# Patient Record
Sex: Male | Born: 1955 | Race: White | Hispanic: No | Marital: Married | Smoking: Former smoker
Health system: Southern US, Community
[De-identification: ages and names within clinical notes are randomized; demographics above are authoritative.]

## PROBLEM LIST (undated history)

## (undated) DIAGNOSIS — R32 Unspecified urinary incontinence: Secondary | ICD-10-CM

## (undated) DIAGNOSIS — Z8619 Personal history of other infectious and parasitic diseases: Secondary | ICD-10-CM

## (undated) DIAGNOSIS — M1990 Unspecified osteoarthritis, unspecified site: Secondary | ICD-10-CM

## (undated) DIAGNOSIS — C61 Malignant neoplasm of prostate: Secondary | ICD-10-CM

## (undated) DIAGNOSIS — I340 Nonrheumatic mitral (valve) insufficiency: Secondary | ICD-10-CM

## (undated) DIAGNOSIS — E119 Type 2 diabetes mellitus without complications: Secondary | ICD-10-CM

## (undated) HISTORY — PX: PROSTATE SURGERY: SHX751

## (undated) HISTORY — DX: Personal history of other infectious and parasitic diseases: Z86.19

## (undated) HISTORY — DX: Malignant neoplasm of prostate: C61

## (undated) HISTORY — PX: HERNIA REPAIR: SHX51

## (undated) HISTORY — DX: Type 2 diabetes mellitus without complications: E11.9

---

## 1982-11-29 HISTORY — PX: KNEE ARTHROSCOPY: SUR90

## 2004-06-17 ENCOUNTER — Emergency Department (HOSPITAL_COMMUNITY): Admission: EM | Admit: 2004-06-17 | Discharge: 2004-06-17 | Payer: Self-pay | Admitting: Family Medicine

## 2006-03-30 ENCOUNTER — Emergency Department (HOSPITAL_COMMUNITY): Admission: AD | Admit: 2006-03-30 | Discharge: 2006-03-30 | Payer: Self-pay | Admitting: Family Medicine

## 2006-12-02 ENCOUNTER — Ambulatory Visit: Payer: Self-pay | Admitting: Surgery

## 2009-11-02 ENCOUNTER — Emergency Department (HOSPITAL_COMMUNITY): Admission: EM | Admit: 2009-11-02 | Discharge: 2009-11-02 | Payer: Self-pay | Admitting: Emergency Medicine

## 2009-11-02 ENCOUNTER — Emergency Department (HOSPITAL_COMMUNITY): Admission: EM | Admit: 2009-11-02 | Discharge: 2009-11-02 | Payer: Self-pay | Admitting: Family Medicine

## 2011-02-19 ENCOUNTER — Ambulatory Visit: Payer: Self-pay | Admitting: Cardiovascular Disease

## 2011-03-02 LAB — URINE CULTURE
Colony Count: 3000
Colony Count: NO GROWTH
Culture: NO GROWTH

## 2011-03-02 LAB — URINE MICROSCOPIC-ADD ON

## 2011-03-02 LAB — POCT URINALYSIS DIP (DEVICE)
Bilirubin Urine: NEGATIVE
Glucose, UA: 100 mg/dL — AB
Hgb urine dipstick: NEGATIVE
Ketones, ur: NEGATIVE mg/dL
Nitrite: POSITIVE — AB
Protein, ur: 30 mg/dL — AB
Specific Gravity, Urine: <=1.005 (ref 1.005–1.030)
Urobilinogen, UA: 1 mg/dL (ref 0.0–1.0)
pH: 5.5 (ref 5.0–8.0)

## 2011-03-02 LAB — GC/CHLAMYDIA PROBE AMP, GENITAL
Chlamydia, DNA Probe: NEGATIVE
GC Probe Amp, Genital: NEGATIVE

## 2011-03-02 LAB — URINALYSIS, ROUTINE W REFLEX MICROSCOPIC
Bilirubin Urine: NEGATIVE
Glucose, UA: NEGATIVE mg/dL
Ketones, ur: 15 mg/dL — AB
Nitrite: POSITIVE — AB
Protein, ur: 30 mg/dL — AB
Specific Gravity, Urine: 1.017 (ref 1.005–1.030)
Urobilinogen, UA: 1 mg/dL (ref 0.0–1.0)
pH: 5.5 (ref 5.0–8.0)

## 2011-03-02 LAB — POCT I-STAT, CHEM 8
BUN: 9 mg/dL (ref 6–23)
Calcium, Ion: 1.23 mmol/L (ref 1.12–1.32)
Chloride: 99 mEq/L (ref 96–112)
Creatinine, Ser: 0.8 mg/dL (ref 0.4–1.5)
Glucose, Bld: 125 mg/dL — ABNORMAL HIGH (ref 70–99)
HCT: 45 % (ref 39.0–52.0)
Hemoglobin: 15.3 g/dL (ref 13.0–17.0)
Potassium: 3.5 mEq/L (ref 3.5–5.1)
Sodium: 140 mEq/L (ref 135–145)
TCO2: 28 mmol/L (ref 0–100)

## 2014-12-03 LAB — HM COLONOSCOPY

## 2015-11-27 ENCOUNTER — Other Ambulatory Visit: Payer: Self-pay | Admitting: Urology

## 2015-11-27 DIAGNOSIS — C61 Malignant neoplasm of prostate: Secondary | ICD-10-CM

## 2015-12-19 ENCOUNTER — Ambulatory Visit (HOSPITAL_COMMUNITY)
Admission: RE | Admit: 2015-12-19 | Discharge: 2015-12-19 | Disposition: A | Discharge status: Home / Self Care | Payer: Commercial Managed Care - HMO | Source: Ambulatory Visit | Attending: Urology | Admitting: Urology

## 2015-12-19 DIAGNOSIS — Z0189 Encounter for other specified special examinations: Secondary | ICD-10-CM | POA: Diagnosis present

## 2015-12-19 DIAGNOSIS — N400 Enlarged prostate without lower urinary tract symptoms: Secondary | ICD-10-CM | POA: Insufficient documentation

## 2015-12-19 DIAGNOSIS — C61 Malignant neoplasm of prostate: Secondary | ICD-10-CM | POA: Insufficient documentation

## 2015-12-19 LAB — POCT I-STAT CREATININE: Creatinine, Ser: 0.9 mg/dL (ref 0.61–1.24)

## 2015-12-19 MED ORDER — GADOBENATE DIMEGLUMINE 529 MG/ML IV SOLN
20.0000 mL | Freq: Once | INTRAVENOUS | Status: AC | PRN
  Administered 2015-12-19: 17 mL via INTRAVENOUS

## 2017-08-17 ENCOUNTER — Encounter: Payer: Self-pay | Admitting: Family Medicine

## 2018-11-29 DIAGNOSIS — C61 Malignant neoplasm of prostate: Secondary | ICD-10-CM

## 2018-11-29 HISTORY — DX: Malignant neoplasm of prostate: C61

## 2019-03-26 ENCOUNTER — Other Ambulatory Visit: Payer: Self-pay | Admitting: Urology

## 2019-03-26 DIAGNOSIS — C61 Malignant neoplasm of prostate: Secondary | ICD-10-CM

## 2019-06-11 ENCOUNTER — Ambulatory Visit
Admission: RE | Admit: 2019-06-11 | Discharge: 2019-06-11 | Disposition: A | Discharge status: Home / Self Care | Payer: 59 | Source: Ambulatory Visit | Attending: Urology | Admitting: Urology

## 2019-06-11 DIAGNOSIS — C61 Malignant neoplasm of prostate: Secondary | ICD-10-CM

## 2019-06-12 ENCOUNTER — Other Ambulatory Visit: Payer: Self-pay | Admitting: Urology

## 2019-06-12 DIAGNOSIS — C61 Malignant neoplasm of prostate: Secondary | ICD-10-CM

## 2019-07-04 ENCOUNTER — Ambulatory Visit
Admission: RE | Admit: 2019-07-04 | Discharge: 2019-07-04 | Disposition: A | Discharge status: Home / Self Care | Payer: 59 | Source: Ambulatory Visit | Attending: Urology | Admitting: Urology

## 2019-07-04 DIAGNOSIS — C61 Malignant neoplasm of prostate: Secondary | ICD-10-CM

## 2019-07-04 MED ORDER — GADOBENATE DIMEGLUMINE 529 MG/ML IV SOLN
18.0000 mL | Freq: Once | INTRAVENOUS | Status: AC | PRN
  Administered 2019-07-04: 18 mL via INTRAVENOUS

## 2019-08-03 DIAGNOSIS — C61 Malignant neoplasm of prostate: Secondary | ICD-10-CM | POA: Insufficient documentation

## 2019-08-03 DIAGNOSIS — Z8546 Personal history of malignant neoplasm of prostate: Secondary | ICD-10-CM | POA: Insufficient documentation

## 2019-08-15 LAB — TESTOSTERONE: Testosterone: 707.39

## 2019-08-15 LAB — HEMOGLOBIN A1C: Hemoglobin A1C: 7.5

## 2019-08-15 LAB — LIPID PANEL
Cholesterol: 172 (ref 0–200)
HDL: 49 (ref 35–70)
LDL Cholesterol: 105
LDl/HDL Ratio: 2.1
Triglycerides: 92 (ref 40–160)

## 2019-08-15 LAB — PSA: PSA: 8.05

## 2019-08-15 LAB — TSH: TSH: 3.05 (ref 0.41–5.90)

## 2019-09-27 DIAGNOSIS — E119 Type 2 diabetes mellitus without complications: Secondary | ICD-10-CM | POA: Insufficient documentation

## 2019-09-27 DIAGNOSIS — J449 Chronic obstructive pulmonary disease, unspecified: Secondary | ICD-10-CM

## 2019-09-27 HISTORY — DX: Chronic obstructive pulmonary disease, unspecified: J44.9

## 2019-10-05 LAB — BASIC METABOLIC PANEL
BUN: 9 (ref 4–21)
Creatinine: 1.1 (ref 0.6–1.3)
Glucose: 146
Potassium: 4 (ref 3.4–5.3)
Sodium: 139 (ref 137–147)

## 2019-10-05 LAB — CBC AND DIFFERENTIAL
HCT: 36 — AB (ref 41–53)
Hemoglobin: 11.3 — AB (ref 13.5–17.5)
Platelets: 213 (ref 150–399)
WBC: 5.6

## 2019-11-05 ENCOUNTER — Encounter: Payer: Self-pay | Admitting: Family Medicine

## 2019-11-05 ENCOUNTER — Other Ambulatory Visit: Payer: Self-pay

## 2019-11-05 ENCOUNTER — Ambulatory Visit (INDEPENDENT_AMBULATORY_CARE_PROVIDER_SITE_OTHER): Payer: Medicare Other | Admitting: Family Medicine

## 2019-11-05 VITALS — BP 118/62 | HR 65 | Temp 98.1°F | Resp 18 | Ht 71.0 in | Wt 195.5 lb

## 2019-11-05 DIAGNOSIS — E119 Type 2 diabetes mellitus without complications: Secondary | ICD-10-CM

## 2019-11-05 DIAGNOSIS — C61 Malignant neoplasm of prostate: Secondary | ICD-10-CM

## 2019-11-05 NOTE — Assessment & Plan Note (Signed)
Surgery 2020. Discussed importance of follow-up with urology. Does not want to drive to [place] for the next 5 years. Has f/u in February for labs. Advised asking them for recommendation nearby - bad experience with [facility] in [place].

## 2019-11-05 NOTE — Assessment & Plan Note (Signed)
Would like to defer labs until 2021 as insurance is changing. Cont metformin and encouraged regular exercise and following diet.

## 2019-11-05 NOTE — Progress Notes (Signed)
Subjective:     Alexander Dixon is a 63 y.o. male presenting for Establish Care (previous PCP was Dr Teofilo Pod.)     HPI  #Prostate cancer - s/p surgery - dealing with incontinence - getting better each day - was initially being treated in [place] - initially diagnosed in 2011  Still taking finasteride for his hair growth - thinks it is helping  #Diabetes Currently taking metaformin Using medications without difficulties: No Hypoglycemic episodes:No  Hyperglycemic episodes:No  - occasionally Feet problems:No  Blood Sugars averaging: does not check regularly  Last HgbA1c: No results found for: HGBA1C -- 7.5%  Diabetes Health Maintenance Due:    Diabetes Health Maintenance Due  Topic Date Due  . HEMOGLOBIN A1C  02-28-1956  . FOOT EXAM  11/24/1964  . OPHTHALMOLOGY EXAM  11/24/1964  . URINE MICROALBUMIN  11/24/1964     Review of Systems  Endocrine: Negative for polydipsia.  Genitourinary: Negative for decreased urine volume and difficulty urinating.     Social History   Tobacco Use  Smoking Status Former Smoker  . Packs/day: 1.00  . Years: 30.00  . Pack years: 30.00  . Types: Cigarettes  . Quit date: 12/2000  . Years since quitting: 18.8  Smokeless Tobacco Never Used        Objective:    BP Readings from Last 3 Encounters:  11/05/19 118/62   Wt Readings from Last 3 Encounters:  11/05/19 195 lb 8 oz (88.7 kg)    BP 118/62   Pulse 65   Temp 98.1 F (36.7 C)   Resp 18   Ht 5\' 11"  (1.803 m)   Wt 195 lb 8 oz (88.7 kg)   SpO2 99%   BMI 27.27 kg/m    Physical Exam Constitutional:      Appearance: Normal appearance. He is not ill-appearing or diaphoretic.  HENT:     Right Ear: External ear normal.     Left Ear: External ear normal.     Nose: Nose normal.  Eyes:     General: No scleral icterus.    Extraocular Movements: Extraocular movements intact.     Conjunctiva/sclera: Conjunctivae normal.  Neck:     Musculoskeletal: Neck  supple.  Cardiovascular:     Rate and Rhythm: Normal rate and regular rhythm.     Heart sounds: No murmur.  Pulmonary:     Effort: Pulmonary effort is normal. No respiratory distress.     Breath sounds: Normal breath sounds. No wheezing.  Skin:    General: Skin is warm and dry.  Neurological:     Mental Status: He is alert. Mental status is at baseline.  Psychiatric:        Mood and Affect: Mood normal.           Assessment & Plan:   Problem List Items Addressed This Visit      Endocrine   Type 2 diabetes mellitus without complication, without long-term current use of insulin ([place])    Would like to defer labs until 2021 as insurance is changing. Cont metformin and encouraged regular exercise and following diet.       Relevant Medications   metFORMIN (GLUCOPHAGE) 1000 MG tablet     Genitourinary   Prostate cancer [facility]) - Primary    Surgery 2020. Discussed importance of follow-up with urology. Does not want to drive to [place] for the next 5 years. Has f/u in February for labs. Advised asking them for recommendation nearby - bad experience with [facility] in  [place].           Return in about 2 months (around 01/06/2020) for annual and labs.  Lesleigh Noe, MD

## 2019-11-05 NOTE — Patient Instructions (Signed)
#   Schedule your Medicare Wellness visit with the nurse and then a visit

## 2019-11-08 ENCOUNTER — Encounter: Payer: Self-pay | Admitting: Unknown Physician Specialty

## 2019-11-21 ENCOUNTER — Encounter: Payer: Self-pay | Admitting: Family Medicine

## 2019-11-21 DIAGNOSIS — I2510 Atherosclerotic heart disease of native coronary artery without angina pectoris: Secondary | ICD-10-CM | POA: Insufficient documentation

## 2019-11-21 DIAGNOSIS — E7800 Pure hypercholesterolemia, unspecified: Secondary | ICD-10-CM | POA: Insufficient documentation

## 2019-11-21 DIAGNOSIS — N529 Male erectile dysfunction, unspecified: Secondary | ICD-10-CM | POA: Insufficient documentation

## 2019-11-21 DIAGNOSIS — I340 Nonrheumatic mitral (valve) insufficiency: Secondary | ICD-10-CM | POA: Insufficient documentation

## 2019-11-21 DIAGNOSIS — I679 Cerebrovascular disease, unspecified: Secondary | ICD-10-CM | POA: Insufficient documentation

## 2019-11-21 HISTORY — DX: Pure hypercholesterolemia, unspecified: E78.00

## 2019-11-26 DIAGNOSIS — L905 Scar conditions and fibrosis of skin: Secondary | ICD-10-CM | POA: Diagnosis not present

## 2019-11-26 DIAGNOSIS — Z4889 Encounter for other specified surgical aftercare: Secondary | ICD-10-CM | POA: Diagnosis not present

## 2019-12-20 DIAGNOSIS — Z4889 Encounter for other specified surgical aftercare: Secondary | ICD-10-CM | POA: Diagnosis not present

## 2019-12-20 DIAGNOSIS — L905 Scar conditions and fibrosis of skin: Secondary | ICD-10-CM | POA: Diagnosis not present

## 2019-12-24 ENCOUNTER — Other Ambulatory Visit: Payer: Self-pay | Admitting: Family Medicine

## 2019-12-24 DIAGNOSIS — E119 Type 2 diabetes mellitus without complications: Secondary | ICD-10-CM

## 2019-12-24 DIAGNOSIS — Z1159 Encounter for screening for other viral diseases: Secondary | ICD-10-CM

## 2019-12-24 NOTE — Progress Notes (Signed)
Pt only due for diabetes screening. And Hep C. Ordering for prior to physical.

## 2019-12-26 ENCOUNTER — Ambulatory Visit (INDEPENDENT_AMBULATORY_CARE_PROVIDER_SITE_OTHER): Payer: Medicare Other

## 2019-12-26 ENCOUNTER — Other Ambulatory Visit: Payer: Self-pay

## 2019-12-26 VITALS — Wt 198.0 lb

## 2019-12-26 DIAGNOSIS — Z0000 Encounter for general adult medical examination without abnormal findings: Secondary | ICD-10-CM

## 2019-12-26 NOTE — Patient Instructions (Signed)
Alexander Dixon , Thank you for taking time to come for your Medicare Wellness Visit. I appreciate your ongoing commitment to your health goals. Please review the following plan we discussed and let me know if I can assist you in the future.   Screening recommendations/referrals: Colonoscopy: referral will be ordered by provider Recommended yearly ophthalmology/optometry visit for glaucoma screening and checkup Recommended yearly dental visit for hygiene and checkup  Vaccinations: Influenza vaccine: declined Pneumococcal vaccine: declined Tdap vaccine: Up to date, completed 09/24/2019 Shingles vaccine: declined    Advanced directives: Advance directive discussed with you today. Even though you declined this today please call our office should you change your mind and we can give you the proper paperwork for you to fill out.   Conditions/risks identified: diabetes  Next appointment: 01/03/2020 @ 2:20 pm   Preventive Care 65 Years and Older, Male Preventive care refers to lifestyle choices and visits with your health care provider that can promote health and wellness. What does preventive care include?  A yearly physical exam. This is also called an annual well check.  Dental exams once or twice a year.  Routine eye exams. Ask your health care provider how often you should have your eyes checked.  Personal lifestyle choices, including:  Daily care of your teeth and gums.  Regular physical activity.  Eating a healthy diet.  Avoiding tobacco and drug use.  Limiting alcohol use.  Practicing safe sex.  Taking low doses of aspirin every day.  Taking vitamin and mineral supplements as recommended by your health care provider. What happens during an annual well check? The services and screenings done by your health care provider during your annual well check will depend on your age, overall health, lifestyle risk factors, and family history of disease. Counseling  Your health  care provider may ask you questions about your:  Alcohol use.  Tobacco use.  Drug use.  Emotional well-being.  Home and relationship well-being.  Sexual activity.  Eating habits.  History of falls.  Memory and ability to understand (cognition).  Work and work Statistician. Screening  You may have the following tests or measurements:  Height, weight, and BMI.  Blood pressure.  Lipid and cholesterol levels. These may be checked every 5 years, or more frequently if you are over 36 years old.  Skin check.  Lung cancer screening. You may have this screening every year starting at age 84 if you have a 30-pack-year history of smoking and currently smoke or have quit within the past 15 years.  Fecal occult blood test (FOBT) of the stool. You may have this test every year starting at age 67.  Flexible sigmoidoscopy or colonoscopy. You may have a sigmoidoscopy every 5 years or a colonoscopy every 10 years starting at age 45.  Prostate cancer screening. Recommendations will vary depending on your family history and other risks.  Hepatitis C blood test.  Hepatitis B blood test.  Sexually transmitted disease (STD) testing.  Diabetes screening. This is done by checking your blood sugar (glucose) after you have not eaten for a while (fasting). You may have this done every 1-3 years.  Abdominal aortic aneurysm (AAA) screening. You may need this if you are a current or former smoker.  Osteoporosis. You may be screened starting at age 20 if you are at high risk. Talk with your health care provider about your test results, treatment options, and if necessary, the need for more tests. Vaccines  Your health care provider may recommend certain  vaccines, such as:  Influenza vaccine. This is recommended every year.  Tetanus, diphtheria, and acellular pertussis (Tdap, Td) vaccine. You may need a Td booster every 10 years.  Zoster vaccine. You may need this after age  59.  Pneumococcal 13-valent conjugate (PCV13) vaccine. One dose is recommended after age 59.  Pneumococcal polysaccharide (PPSV23) vaccine. One dose is recommended after age 21. Talk to your health care provider about which screenings and vaccines you need and how often you need them. This information is not intended to replace advice given to you by your health care provider. Make sure you discuss any questions you have with your health care provider. Document Released: 12/12/2015 Document Revised: 08/04/2016 Document Reviewed: 09/16/2015 Elsevier Interactive Patient Education  2017 [place] Prevention in the Home Falls can cause injuries. They can happen to people of all ages. There are many things you can do to make your home safe and to help prevent falls. What can I do on the outside of my home?  Regularly fix the edges of walkways and driveways and fix any cracks.  Remove anything that might make you trip as you walk through a door, such as a raised step or threshold.  Trim any bushes or trees on the path to your home.  Use bright outdoor lighting.  Clear any walking paths of anything that might make someone trip, such as rocks or tools.  Regularly check to see if handrails are loose or broken. Make sure that both sides of any steps have handrails.  Any raised decks and porches should have guardrails on the edges.  Have any leaves, snow, or ice cleared regularly.  Use sand or salt on walking paths during winter.  Clean up any spills in your garage right away. This includes oil or grease spills. What can I do in the bathroom?  Use night lights.  Install grab bars by the toilet and in the tub and shower. Do not use towel bars as grab bars.  Use non-skid mats or decals in the tub or shower.  If you need to sit down in the shower, use a plastic, non-slip stool.  Keep the floor dry. Clean up any water that spills on the floor as soon as it happens.  Remove  soap buildup in the tub or shower regularly.  Attach bath mats securely with double-sided non-slip rug tape.  Do not have throw rugs and other things on the floor that can make you trip. What can I do in the bedroom?  Use night lights.  Make sure that you have a light by your bed that is easy to reach.  Do not use any sheets or blankets that are too big for your bed. They should not hang down onto the floor.  Have a firm chair that has side arms. You can use this for support while you get dressed.  Do not have throw rugs and other things on the floor that can make you trip. What can I do in the kitchen?  Clean up any spills right away.  Avoid walking on wet floors.  Keep items that you use a lot in easy-to-reach places.  If you need to reach something above you, use a strong step stool that has a grab bar.  Keep electrical cords out of the way.  Do not use floor polish or wax that makes floors slippery. If you must use wax, use non-skid floor wax.  Do not have throw rugs and other things  on the floor that can make you trip. What can I do with my stairs?  Do not leave any items on the stairs.  Make sure that there are handrails on both sides of the stairs and use them. Fix handrails that are broken or loose. Make sure that handrails are as long as the stairways.  Check any carpeting to make sure that it is firmly attached to the stairs. Fix any carpet that is loose or worn.  Avoid having throw rugs at the top or bottom of the stairs. If you do have throw rugs, attach them to the floor with carpet tape.  Make sure that you have a light switch at the top of the stairs and the bottom of the stairs. If you do not have them, ask someone to add them for you. What else can I do to help prevent falls?  Wear shoes that:  Do not have high heels.  Have rubber bottoms.  Are comfortable and fit you well.  Are closed at the toe. Do not wear sandals.  If you use a  stepladder:  Make sure that it is fully opened. Do not climb a closed stepladder.  Make sure that both sides of the stepladder are locked into place.  Ask someone to hold it for you, if possible.  Clearly mark and make sure that you can see:  Any grab bars or handrails.  First and last steps.  Where the edge of each step is.  Use tools that help you move around (mobility aids) if they are needed. These include:  Canes.  Walkers.  Scooters.  Crutches.  Turn on the lights when you go into a dark area. Replace any light bulbs as soon as they burn out.  Set up your furniture so you have a clear path. Avoid moving your furniture around.  If any of your floors are uneven, fix them.  If there are any pets around you, be aware of where they are.  Review your medicines with your doctor. Some medicines can make you feel dizzy. This can increase your chance of falling. Ask your doctor what other things that you can do to help prevent falls. This information is not intended to replace advice given to you by your health care provider. Make sure you discuss any questions you have with your health care provider. Document Released: 09/11/2009 Document Revised: 04/22/2016 Document Reviewed: 12/20/2014 Elsevier Interactive Patient Education  2017 [employer].

## 2019-12-26 NOTE — Progress Notes (Signed)
Subjective:   Alexander Dixon is a 64 y.o. male who presents for an Initial Medicare Annual Wellness Visit.  Review of Systems: N/A   This visit is being conducted through telemedicine via telephone at the nurse health advisor's home address due to the COVID-19 pandemic. This patient has given me verbal consent via doximity to conduct this visit, patient states they are participating from their home address. Patient and myself are on the telephone call. There is no referral for this visit. Some vital signs may be absent or patient reported.    Patient identification: identified by name, DOB, and current address   Cardiac Risk Factors include: advanced age (>64men, >24 women);diabetes mellitus;male gender    Objective:    Today's Vitals   12/26/19 1341  Weight: 198 lb (89.8 kg)   Body mass index is 27.62 kg/m.  Advanced Directives 12/26/2019  Does Patient Have a Medical Advance Directive? No  Would patient like information on creating a medical advance directive? No - Patient declined    Current Medications (verified) Outpatient Encounter Medications as of 12/26/2019  Medication Sig  . Accu-Chek FastClix Lancets MISC USE 1 LANCET TWICE DAILY FOR FINGERSTICK BS TESTING DX E11.9  . ACCU-CHEK GUIDE test strip USE 1 TEST STRIP TWICE DAILY FOR FINGERSTICK BS DX E11.9  . Cholecalciferol (VITAMIN D3) 50 MCG (2000 UT) TABS Take by mouth.  . finasteride (PROSCAR) 5 MG tablet Take by mouth. Takes 1/4th of 5 mg daily  . Magnesium 250 MG TABS Take by mouth.  . metFORMIN (GLUCOPHAGE) 1000 MG tablet Take 1 tablet by mouth 2 (two) times daily.  . Multiple Vitamins-Minerals (CENTRUM SILVER PO) Take by mouth.  . omega-3 acid ethyl esters (LOVAZA) 1 g capsule Take 2 capsules by mouth daily.  . Potassium 99 MG TABS Take by mouth.  . vitamin B-12 (CYANOCOBALAMIN) 1000 MCG tablet Take by mouth.  . Zinc Acetate 50 MG CAPS Take by mouth.  . tadalafil (CIALIS) 5 MG tablet Take by mouth.   No  facility-administered encounter medications on file as of 12/26/2019.    Allergies (verified) Penicillin g   History: Past Medical History:  Diagnosis Date  . Diabetes mellitus without complication ([place])   . History of chicken pox   . Prostate cancer [facility])    Past Surgical History:  Procedure Laterality Date  . HERNIA REPAIR     inguinal  . KNEE ARTHROSCOPY Right 1984  . PROSTATE SURGERY     due to prostate cancer   Family History  Problem Relation Age of Onset  . Leukemia Mother   . Arthritis Mother   . Diabetes Mother   . Alcohol abuse Father   . COPD Father   . Arthritis Maternal Grandmother   . Diabetes Maternal Grandmother   . Hearing loss Maternal Grandmother   . Arthritis Maternal Grandfather   . Heart attack Maternal Grandfather 52  . Diabetes Maternal Grandfather   . Heart disease Maternal Grandfather   . Hyperlipidemia Maternal Grandfather   . Hypertension Maternal Grandfather   . Prostate cancer Maternal Grandfather   . Prostate cancer Maternal Uncle   . Prostate cancer Cousin    Social History   Socioeconomic History  . Marital status: Married    Spouse name: [name]  . Number of children: 2  . Years of education: associates degree  . Highest education level: Not on file  Occupational History  . Not on file  Tobacco Use  . Smoking status: Former Smoker  Packs/day: 1.00    Years: 30.00    Pack years: 30.00    Types: Cigarettes    Quit date: 12/2000    Years since quitting: 19.0  . Smokeless tobacco: Never Used  Substance and Sexual Activity  . Alcohol use: Not Currently    Comment: once in a blue moon  . Drug use: Never  . Sexual activity: Yes    Birth control/protection: Post-menopausal, Surgical  Other Topics Concern  . 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 Difficulty of Paying Living Expenses: Not hard at all  Food Insecurity: No Food Insecurity  . Worried About Charity fundraiser in the Last Year: Never true  . Ran Out of Food in the Last Year: Never true  Transportation Needs: No Transportation Needs  . Lack of Transportation (Medical): No  . Lack of Transportation (Non-Medical): No  Physical Activity: Sufficiently Active  . Days of Exercise per Week: 5 days  . Minutes of Exercise per Session: 60 min  Stress: No Stress Concern Present  . Feeling of Stress : Not at all  Social Connections:   . Frequency of Communication with Friends and Family: Not on file  . Frequency of Social Gatherings with Friends and Family: Not on file  . Attends Religious Services: Not on file  . Active Member of Clubs or Organizations: Not on file  . Attends Archivist Meetings: Not on file  . Marital Status: Not on file   Tobacco Counseling Counseling given: Not Answered   Clinical Intake:  Pre-visit preparation completed: Yes  Pain : No/denies pain     Nutritional Risks: None Diabetes: Yes CBG done?: No Did pt. bring in CBG monitor from home?: No  How often do you need to have someone help you when you read instructions, pamphlets, or other written materials from your doctor or pharmacy?: 1 - Never What is the last grade level you completed in school?: Associates  Interpreter Needed?: No  Information entered by :: CJohnson, LPN  Activities of Daily Living In your present state of health, do you have any difficulty performing the following activities: 12/26/2019    Hearing? N  Vision? N  Difficulty concentrating or making decisions? N  Walking or climbing stairs? N  Dressing or bathing? N  Doing errands, shopping? N  Preparing Food and eating ? N  Using the Toilet? N  In the past six months, have you accidently leaked urine? Y  Comment some incontinence  Do you have problems with loss of bowel control? N  Managing your Medications? N  Managing your Finances? N  Housekeeping or managing your Housekeeping? N  Some recent data might be hidden     Immunizations and Health Maintenance Immunization History  Administered Date(s) Administered  . Tdap 09/24/2019   Health Maintenance Due  Topic Date Due  . Hepatitis C Screening  November 03, 1956  . FOOT EXAM  11/24/1964  . OPHTHALMOLOGY EXAM  11/24/1964  . URINE MICROALBUMIN  11/24/1964  . HIV Screening  11/24/1969  . COLONOSCOPY  12/04/2019    Patient Care Team: Lesleigh Noe, MD as PCP - General (Family Medicine)  Indicate any recent Medical Services you may have received from other than [facility] providers in the past year (date may be approximate).    Assessment:   This is a routine wellness examination for [employer].  Hearing/Vision screen  Hearing Screening   125Hz  250Hz  500Hz  1000Hz  2000Hz  3000Hz  4000Hz  6000Hz  8000Hz   Right ear:           Left ear:           Vision Screening Comments: Patient gets annual eye exams   Dietary issues and exercise activities discussed: Current Exercise Habits: Structured exercise class, Type of exercise: strength training/weights, Time (Minutes): 60, Frequency (Times/Week): 5, Weekly Exercise (Minutes/Week): 300, Intensity: Moderate, Exercise limited by: None identified  Goals    . Patient Stated     12/26/2019, I will maintain and continue medications as prescribed.       Depression Screen PHQ 2/9 Scores 12/26/2019 11/05/2019  PHQ - 2 Score 0 0  PHQ- 9 Score 0 -    Fall Risk Fall Risk  12/26/2019  Falls in the past year? 0  Number falls in past yr:  0  Injury with Fall? 0  Risk for fall due to : No Fall Risks  Follow up Falls evaluation completed;Falls prevention discussed    Is the patient's home free of loose throw rugs in walkways, pet beds, electrical cords, etc?   yes      Grab bars in the bathroom? no      Handrails on the stairs?   yes      Adequate lighting?   yes  Timed Get Up and Go performed: N/A  Cognitive Function: MMSE - Mini Mental State Exam 12/26/2019  Orientation to time 5  Orientation to Place 5  Registration 3  Attention/ Calculation 5  Recall 3  Language- repeat 1       Mini Cog  Mini-Cog screen was completed. Maximum score is 22. A value of 0 denotes this part of the MMSE was not completed or the patient failed this part of the Mini-Cog screening.  Screening Tests Health Maintenance  Topic Date Due  . Hepatitis C Screening  Apr 03, 1956  . FOOT EXAM  11/24/1964  . OPHTHALMOLOGY EXAM  11/24/1964  . URINE MICROALBUMIN  11/24/1964  . HIV Screening  11/24/1969  . COLONOSCOPY  12/04/2019  . INFLUENZA VACCINE  02/27/2020 (Originally 06/30/2019)  . PNA vac Low Risk Adult (1 of 2 - PCV13) 12/25/2020 (Originally 11/25/2019)  . HEMOGLOBIN A1C  02/12/2020  . TETANUS/TDAP  09/23/2029    Qualifies for Shingles Vaccine? Yes  Cancer Screenings: Lung: Low Dose CT Chest recommended if Age 35-80 years, 30 pack-year currently smoking OR have quit w/in 15years. Patient does not qualify. Colorectal: Patient wants referral sent and appointment scheduled.   Additional Screenings:  Hepatitis C Screening: due      Plan:    Patient will maintain and continue medications as prescribed.   I have personally reviewed and noted the following in the patient's chart:   . Medical and social history . Use of alcohol, tobacco or illicit drugs  .  Current medications and supplements . Functional ability and status . Nutritional status . Physical activity . Advanced directives . List of other  physicians . Hospitalizations, surgeries, and ER visits in previous 12 months . Vitals . Screenings to include cognitive, depression, and falls . Referrals and appointments  In addition, I have reviewed and discussed with patient certain preventive protocols, quality metrics, and best practice recommendations. A written personalized care plan for preventive services as well as general preventive health recommendations were provided to patient.     Andrez Grime, LPN   [id number]

## 2019-12-26 NOTE — Progress Notes (Signed)
PCP notes:  Health Maintenance: Needs colonoscopy- patient agrees to have this done Declined flu and pneumonia vaccines Eye exam- Patient will schedule own appointment.    Abnormal Screenings: none   Patient concerns: none   Nurse concerns: none   Next PCP appt.: 01/03/2020 @ 2:20 pm

## 2019-12-27 ENCOUNTER — Telehealth: Payer: Self-pay | Admitting: Family Medicine

## 2019-12-27 ENCOUNTER — Other Ambulatory Visit: Payer: Self-pay

## 2019-12-27 ENCOUNTER — Other Ambulatory Visit (INDEPENDENT_AMBULATORY_CARE_PROVIDER_SITE_OTHER): Payer: Medicare Other

## 2019-12-27 DIAGNOSIS — Z125 Encounter for screening for malignant neoplasm of prostate: Secondary | ICD-10-CM

## 2019-12-27 DIAGNOSIS — E119 Type 2 diabetes mellitus without complications: Secondary | ICD-10-CM | POA: Diagnosis not present

## 2019-12-27 DIAGNOSIS — Z1159 Encounter for screening for other viral diseases: Secondary | ICD-10-CM

## 2019-12-27 LAB — PSA, MEDICARE: PSA: 0 ng/ml — ABNORMAL LOW (ref 0.10–4.00)

## 2019-12-27 LAB — HEMOGLOBIN A1C: Hgb A1c MFr Bld: 7.6 % — ABNORMAL HIGH (ref 4.6–6.5)

## 2019-12-27 NOTE — Telephone Encounter (Signed)
Patient had lab work done this morning.  Patient wanted to make sure his PSA would be tested.  Patient said he did discuss this with the lab when he had his labs drawn.

## 2019-12-27 NOTE — Telephone Encounter (Signed)
Lab was collected and will be ordered.   Would recommend that he wait until the day of the appointment for future blood work as my routine labs may be different than what he has typically received. I generally like to discuss tests like a PSA with patients before ordering.

## 2019-12-27 NOTE — Telephone Encounter (Signed)
Please review

## 2019-12-27 NOTE — Telephone Encounter (Signed)
Patient advised.

## 2019-12-27 NOTE — Addendum Note (Signed)
Addended by: Ellamae Sia on: 12/27/2019 09:12 AM   Modules accepted: Orders

## 2019-12-28 LAB — HEPATITIS C ANTIBODY
Hepatitis C Ab: NONREACTIVE
SIGNAL TO CUT-OFF: 0.01 (ref <1.00)

## 2020-01-03 ENCOUNTER — Ambulatory Visit (INDEPENDENT_AMBULATORY_CARE_PROVIDER_SITE_OTHER): Payer: Medicare Other | Admitting: Family Medicine

## 2020-01-03 ENCOUNTER — Encounter: Payer: Self-pay | Admitting: Family Medicine

## 2020-01-03 ENCOUNTER — Other Ambulatory Visit: Payer: Self-pay

## 2020-01-03 VITALS — BP 132/80 | HR 68 | Temp 97.6°F | Resp 10 | Ht 71.0 in | Wt 202.5 lb

## 2020-01-03 DIAGNOSIS — I2510 Atherosclerotic heart disease of native coronary artery without angina pectoris: Secondary | ICD-10-CM

## 2020-01-03 DIAGNOSIS — C61 Malignant neoplasm of prostate: Secondary | ICD-10-CM

## 2020-01-03 DIAGNOSIS — I679 Cerebrovascular disease, unspecified: Secondary | ICD-10-CM | POA: Diagnosis not present

## 2020-01-03 DIAGNOSIS — Z0000 Encounter for general adult medical examination without abnormal findings: Secondary | ICD-10-CM | POA: Diagnosis not present

## 2020-01-03 DIAGNOSIS — E119 Type 2 diabetes mellitus without complications: Secondary | ICD-10-CM

## 2020-01-03 DIAGNOSIS — Z1211 Encounter for screening for malignant neoplasm of colon: Secondary | ICD-10-CM

## 2020-01-03 NOTE — Patient Instructions (Addendum)
Cholesterol  - work on diet and exercise - return in 3 months for fasting labs for cholesterol and a visit - start baby aspirin every   Preventive Care 66 Years and Older, Male Preventive care refers to lifestyle choices and visits with your health care provider that can promote health and wellness. This includes:  A yearly physical exam. This is also called an annual well check.  Regular dental and eye exams.  Immunizations.  Screening for certain conditions.  Healthy lifestyle choices, such as diet and exercise. What can I expect for my preventive care visit? Physical exam Your health care provider will check:  Height and weight. These may be used to calculate body mass index (BMI), which is a measurement that tells if you are at a healthy weight.  Heart rate and blood pressure.  Your skin for abnormal spots. Counseling Your health care provider may ask you questions about:  Alcohol, tobacco, and drug use.  Emotional well-being.  Home and relationship well-being.  Sexual activity.  Eating habits.  History of falls.  Memory and ability to understand (cognition).  Work and work Statistician. What immunizations do I need?  Influenza (flu) vaccine  This is recommended every year. Tetanus, diphtheria, and pertussis (Tdap) vaccine  You may need a Td booster every 10 years. Varicella (chickenpox) vaccine  You may need this vaccine if you have not already been vaccinated. Zoster (shingles) vaccine  You may need this after age 61. Pneumococcal conjugate (PCV13) vaccine  One dose is recommended after age 9. Pneumococcal polysaccharide (PPSV23) vaccine  One dose is recommended after age 20. Measles, mumps, and rubella (MMR) vaccine  You may need at least one dose of MMR if you were born in 1957 or later. You may also need a second dose. Meningococcal conjugate (MenACWY) vaccine  You may need this if you have certain conditions. Hepatitis A vaccine  You  may need this if you have certain conditions or if you travel or work in places where you may be exposed to hepatitis A. Hepatitis B vaccine  You may need this if you have certain conditions or if you travel or work in places where you may be exposed to hepatitis B. Haemophilus influenzae type b (Hib) vaccine  You may need this if you have certain conditions. You may receive vaccines as individual doses or as more than one vaccine together in one shot (combination vaccines). Talk with your health care provider about the risks and benefits of combination vaccines. What tests do I need? Blood tests  Lipid and cholesterol levels. These may be checked every 5 years, or more frequently depending on your overall health.  Hepatitis C test.  Hepatitis B test. Screening  Lung cancer screening. You may have this screening every year starting at age 38 if you have a 30-pack-year history of smoking and currently smoke or have quit within the past 15 years.  Colorectal cancer screening. All adults should have this screening starting at age 57 and continuing until age 12. Your health care provider may recommend screening at age 22 if you are at increased risk. You will have tests every 1-10 years, depending on your results and the type of screening test.  Prostate cancer screening. Recommendations will vary depending on your family history and other risks.  Diabetes screening. This is done by checking your blood sugar (glucose) after you have not eaten for a while (fasting). You may have this done every 1-3 years.  Abdominal aortic aneurysm (AAA) screening.  You may need this if you are a current or former smoker.  Sexually transmitted disease (STD) testing. Follow these instructions at home: Eating and drinking  Eat a diet that includes fresh fruits and vegetables, whole grains, lean protein, and low-fat dairy products. Limit your intake of foods with high amounts of sugar, saturated fats, and  salt.  Take vitamin and mineral supplements as recommended by your health care provider.  Do not drink alcohol if your health care provider tells you not to drink.  If you drink alcohol: ? Limit how much you have to 0-2 drinks a day. ? Be aware of how much alcohol is in your drink. In the [place] one drink equals one 12 oz bottle of beer (355 mL), one 5 oz glass of wine (148 mL), or one 1 oz glass of hard liquor (44 mL). Lifestyle  Take daily care of your teeth and gums.  Stay active. Exercise for at least 30 minutes on 5 or more days each week.  Do not use any products that contain nicotine or tobacco, such as cigarettes, e-cigarettes, and chewing tobacco. If you need help quitting, ask your health care provider.  If you are sexually active, practice safe sex. Use a condom or other form of protection to prevent STIs (sexually transmitted infections).  Talk with your health care provider about taking a low-dose aspirin or statin. What's next?  Visit your health care provider once a year for a well check visit.  Ask your health care provider how often you should have your eyes and teeth checked.  Stay up to date on all vaccines. This information is not intended to replace advice given to you by your health care provider. Make sure you discuss any questions you have with your health care provider. Document Revised: 11/09/2018 Document Reviewed: 11/09/2018 Elsevier Patient Education  2020 [employer].  day

## 2020-01-03 NOTE — Progress Notes (Signed)
Annual Exam   Chief Complaint:  Chief Complaint  Patient presents with  . welcome to medicare    History of Present Illness:  Alexander Dixon is a 64 y.o. presents today for annual examination.    Needs to do better on his diet Is seeing physical therapy for incontinence  Nutrition/Lifestyle Diet: needs to do better with diet Exercise: going to gym 5 days a week He is single partner, contraception - post menopausal status.   Social History   Tobacco Use  Smoking Status Former Smoker  . Packs/day: 1.00  . Years: 30.00  . Pack years: 30.00  . Types: Cigarettes  . Quit date: 12/2000  . Years since quitting: 19.0  Smokeless Tobacco Never Used   Social History   Substance and Sexual Activity  Alcohol Use Not Currently   Comment: once in a blue moon   Social History   Substance and Sexual Activity  Drug Use Never     Safety The patient wears seatbelts: yes.     The patient feels safe at home and in their relationships: yes.  General Health Dentist in the last year: Yes Eye doctor: referral placed today  Weight Wt Readings from Last 3 Encounters:  01/03/20 202 lb 8 oz (91.9 kg)  12/26/19 198 lb (89.8 kg)  11/05/19 195 lb 8 oz (88.7 kg)   Patient has high BMI  BMI Readings from Last 1 Encounters:  01/03/20 28.24 kg/m     Chronic disease screening Blood pressure monitoring:  BP Readings from Last 3 Encounters:  01/03/20 132/80  11/05/19 118/62    Lipid Monitoring: Indication for screening: age >35, obesity, diabetes, family hx, CV risk factors.  Lipid screening: Yes  Lab Results  Component Value Date   CHOL 172 08/15/2019   HDL 49 08/15/2019   LDLCALC 105 08/15/2019   TRIG 92 08/15/2019     Diabetes Screening: age >79, overweight, family hx, PCOS, hx of gestational diabetes, at risk ethnicity, elevated blood pressure >135/80.  Diabetes Screening screening: Yes  Lab Results  Component Value Date   HGBA1C 7.6 (H) 12/27/2019      Prostate Cancer Screening: Yes, Not Indicated Pt already has prostate cancer history   Colon Cancer Screening Age 40-75 yo - benefits outweigh the risk. Adults 23-85 yo who have never been screened benefit.  Benefits: 134000 people in 2016 will be diagnosed and 49,000 will die - early detection helps [place]: Complications 2/2 to colonoscopy High Risk (Colonoscopy): genetic disorder (Lynch syndrome or familial adenomatous polyposis), personal hx of IBD, previous adenomatous polyp, or previous colorectal cancer, FamHx start 10 years before the age at diagnosis, increased in males and black race  Options:  FIT - looks for hemoglobin (blood in the stool) - specific and fairly sensitive - must be done annually Cologuard - looks for DNA and blood - more sensitive - therefore can have more false positives, every 3 years Colonoscopy - every 10 years if normal - sedation, bowl prep, must have someone drive you  Shared decision making and the patient had decided to do Colonoscopy.   Lung Cancer Screening Annual screening for men age 53-80 yo? Yes Current Tobacco user? No Quit less than 15 years ago? No Interested in low dose CT for lung cancer screening? not applicable  Abdominal Aortic Aneurysm:  Age 63-75, 1 time screening, men who have ever smoked done - will get records  Immunization History  Administered Date(s) Administered  . Tdap 09/24/2019    Past Medical  History:  Diagnosis Date  . Diabetes mellitus without complication ([place])   . History of chicken pox   . Prostate cancer [facility])     Past Surgical History:  Procedure Laterality Date  . HERNIA REPAIR     inguinal  . KNEE ARTHROSCOPY Right 1984  . PROSTATE SURGERY     due to prostate cancer    Prior to Admission medications   Medication Sig Start Date End Date Taking? Authorizing Provider  Accu-Chek FastClix Lancets MISC USE 1 LANCET TWICE DAILY FOR FINGERSTICK BS TESTING DX E11.9 06/25/19  Yes [provider]  ACCU-CHEK GUIDE test strip USE 1 TEST STRIP TWICE DAILY FOR FINGERSTICK BS DX E11.9 08/02/19  Yes [provider]  Cholecalciferol (VITAMIN D3) 50 MCG (2000 UT) TABS Take by mouth.   Yes [provider]  finasteride (PROSCAR) 5 MG tablet Take by mouth. Takes 1/4th of 5 mg daily   Yes [provider]  Magnesium 250 MG TABS Take by mouth.   Yes [provider]  metFORMIN (GLUCOPHAGE) 1000 MG tablet Take 1 tablet by mouth 2 (two) times daily.   Yes [provider]  Multiple Vitamins-Minerals (CENTRUM SILVER PO) Take by mouth.   Yes [provider]  omega-3 acid ethyl esters (LOVAZA) 1 g capsule Take 2 capsules by mouth daily. 10/14/19  Yes [provider]  Potassium 99 MG TABS Take by mouth.   Yes [provider]  vitamin B-12 (CYANOCOBALAMIN) 1000 MCG tablet Take by mouth.   Yes [provider]  Zinc Acetate 50 MG CAPS Take by mouth.   Yes [provider]  tadalafil (CIALIS) 5 MG tablet Take by mouth. 10/22/19 11/21/19  [provider]    Allergies  Allergen Reactions  . Penicillin G Nausea Only    RXN AS A CHILD     Social History   Socioeconomic History  . Marital status: Married    Spouse name: Alexander Dixon  . Number of children: 2  . Years of education: associates degree  . Highest education level: Not on file  Occupational History  . Not on file  Tobacco Use  . Smoking status: Former Smoker    Packs/day: 1.00    Years: 30.00    Pack years: 30.00    Types: Cigarettes    Quit date: 12/2000    Years since quitting: 19.0  . Smokeless tobacco: Never Used  Substance and Sexual Activity  . Alcohol use: Not Currently    Comment: once in a blue moon  . Drug use: Never  . Sexual activity: Yes    Birth control/protection: Post-menopausal, Surgical  Other Topics Concern  . 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 Difficulty of Paying Living Expenses:  Not hard at all  Food Insecurity: No Food Insecurity  . Worried About Charity fundraiser in the Last Year: Never true  . Ran Out of Food in the Last Year: Never true  Transportation Needs: No Transportation Needs  . Lack of Transportation (Medical): No  . Lack of Transportation (Non-Medical): No  Physical Activity: Sufficiently Active  . Days of Exercise per Week: 5 days  . Minutes of Exercise per Session: 60 min  Stress: No Stress Concern Present  . Feeling of Stress : Not at all  Social Connections:   . Frequency of Communication with Friends and Family: Not on file  . Frequency of Social Gatherings with Friends and Family: Not on file  . Attends Religious Services: Not on file  . Active Member of Clubs or Organizations: Not on file  . Attends Archivist Meetings: Not on file  . Marital Status: Not on file  Intimate Partner Violence: Not At Risk  . Fear of Current or Ex-Partner: No  . Emotionally Abused: No  . Physically Abused: No  . Sexually Abused: No    Family History  Problem Relation Age of Onset  . Leukemia Mother   . Arthritis Mother   . Diabetes Mother   . Alcohol abuse Father   . COPD Father   . Arthritis Maternal Grandmother   . Diabetes Maternal Grandmother   . Hearing loss Maternal Grandmother   . Arthritis Maternal Grandfather   . Heart attack Maternal Grandfather 52  . Diabetes Maternal Grandfather   . Heart disease Maternal  Grandfather   . Hyperlipidemia Maternal Grandfather   . Hypertension Maternal Grandfather   . Prostate cancer Maternal Grandfather   . Prostate cancer Maternal Uncle   . Prostate cancer Cousin     Review of Systems  Constitutional: Negative for chills and fever.  HENT: Negative for congestion and sore throat.   Eyes: Negative for blurred vision and double vision.  Respiratory: Negative for shortness of breath.   Cardiovascular: Negative for chest pain.  Gastrointestinal: Negative for heartburn, nausea and vomiting.  Genitourinary:       Incontinence  Musculoskeletal: Negative.  Negative for myalgias.  Skin: Negative for rash.  Neurological: Negative for dizziness and headaches.  Endo/Heme/Allergies: Does not bruise/bleed easily.  Psychiatric/Behavioral: Negative for depression. The patient is not nervous/anxious.      Physical Exam BP 132/80   Pulse 68   Temp 97.6 F (36.4 C)   Resp 10   Ht 5\' 11"  (1.803 m)   Wt 202 lb 8 oz (91.9 kg)   SpO2 97%   BMI 28.24 kg/m    BP Readings from Last 3 Encounters:  01/03/20 132/80  11/05/19 118/62      Physical Exam Constitutional:      General: He is not in acute distress.    Appearance: He is well-developed. He is not diaphoretic.  HENT:     Head: Normocephalic and atraumatic.     Right Ear: Tympanic membrane and ear canal normal.     Left Ear: Tympanic membrane and ear canal normal.     Mouth/Throat:     Pharynx: Uvula midline.  Eyes:     General: No scleral icterus.    Conjunctiva/sclera: Conjunctivae normal.     Pupils: Pupils are equal, round, and reactive to light.  Cardiovascular:     Rate and Rhythm: Normal rate and regular rhythm.     Heart sounds: Normal heart sounds. No murmur.  Pulmonary:  Effort: Pulmonary effort is normal. No respiratory distress.     Breath sounds: Normal breath sounds. No wheezing.  Abdominal:     General: Bowel sounds are normal. There is no distension.     Palpations: Abdomen is  soft. There is no mass.     Tenderness: There is no abdominal tenderness. There is no guarding.  Musculoskeletal:        General: Normal range of motion.     Cervical back: Normal range of motion and neck supple.  Lymphadenopathy:     Cervical: No cervical adenopathy.  Skin:    General: Skin is warm and dry.     Capillary Refill: Capillary refill takes less than 2 seconds.  Neurological:     Mental Status: He is alert and oriented to person, place, and time.        Results:  PHQ-9:    Clinical Support from 12/26/2019 in [place] at [facility]  PHQ-9 Total Score  0     EKG: Sinus rhythm, q-wave in II, III, aVF possible old infarct. No ST changes   Assessment: 64 y.o. here for routine annual physical examination.  Plan: Problem List Items Addressed This Visit      Cardiovascular and Mediastinum   Cerebrovascular disease   Relevant Orders   EKG 12-Lead (Completed)   Coronary arteriosclerosis   Relevant Orders   EKG 12-Lead (Completed)     Endocrine   Type 2 diabetes mellitus without complication, without long-term current use of insulin ([place])   Relevant Orders   Ambulatory referral to Ophthalmology   EKG 12-Lead (Completed)     Genitourinary   Prostate cancer ([place])    Other Visit Diagnoses    Annual physical exam    -  Primary   Colon cancer screening       Relevant Orders   Ambulatory referral to Gastroenterology      Screening: -- Blood pressure screen normal -- cholesterol screening: elevated, discussed benefit of statin - pt would like to work on diet/exercise and return in 3 months -- Weight screening: overweight: continue to monitor -- Diabetes Screening: has diabetes, cont medication -- Nutrition: normal - encouraged diabetic diet  The 10-year ASCVD risk score Mikey Bussing DC Jr., et al., 2013) is: 22.2%*   Values used to calculate the score:     Age: 2 years     Sex: Male     Is Non-Hispanic African American: No     Diabetic: Yes      Tobacco smoker: No     Systolic Blood Pressure: [id number] mmHg     Is BP treated: No     HDL Cholesterol: 49 mg/dL*     Total Cholesterol: 172 mg/dL*     * - Cholesterol units were assumed for this score calculation  -- ASA 81 mg discussed if CVD risk >10% age 75-59 and willing to take for 10 years -- Statin therapy for Age 75-75 with CVD risk >7.5%  >>> he declined medication at this time will work on diet/exercise/weight loss  Psych -- Depression screening (PHQ-9): negative  Safety -- tobacco screening: not using -- alcohol screening:  low-risk usage. -- no evidence of domestic violence or intimate partner violence.  Cancer Screening -- Prostate (age 23-69) done- f/u with urology planned due to cancer -- Colon ordered -- Lung not indicated   Immunizations -- flu vaccine declined -- TDAP q10 years up to date -- PPSV-23 (19-64 with chronic disease or smoking) declined -- PCV-13 (age >  65) - one dose followed by PPSV-23 1 year later declined  Encouraged regular vision and dental. Healthy eating - diabetic diet  Lesleigh Noe

## 2020-02-01 ENCOUNTER — Other Ambulatory Visit: Payer: Self-pay

## 2020-02-01 ENCOUNTER — Telehealth: Payer: Self-pay

## 2020-02-01 DIAGNOSIS — Z1211 Encounter for screening for malignant neoplasm of colon: Secondary | ICD-10-CM

## 2020-02-01 MED ORDER — NA SULFATE-K SULFATE-MG SULF 17.5-3.13-1.6 GM/177ML PO SOLN: 1.0000 | Freq: Once | ORAL | 0 refills | Status: AC

## 2020-02-01 NOTE — Telephone Encounter (Signed)
Gastroenterology Pre-Procedure Review  Request Date: Thursday 02/21/20 Requesting Physician: Dr. Vicente Males  PATIENT REVIEW QUESTIONS: The patient responded to the following health history questions as indicated:    1. Are you having any GI issues? no 2. Do you have a personal history of Polyps? yes (early 1980's) 3. Do you have a family history of Colon Cancer or Polyps? no 4. Diabetes Mellitus? yes (oral meds) 5. Joint replacements in the past 12 months?Prostate Removal November 2020 6. Major health problems in the past 3 months?yes (Prostate Removal 2020 November) 7. Any artificial heart valves, MVP, or defibrillator?no    MEDICATIONS & ALLERGIES:    Patient reports the following regarding taking any anticoagulation/antiplatelet therapy:   Plavix, Coumadin, Eliquis, Xarelto, Lovenox, Pradaxa, Brilinta, or Effient? no Aspirin? no  Patient confirms/reports the following medications:  Current Outpatient Medications  Medication Sig Dispense Refill  . Accu-Chek FastClix Lancets MISC USE 1 LANCET TWICE DAILY FOR FINGERSTICK BS TESTING DX E11.9    . ACCU-CHEK GUIDE test strip USE 1 TEST STRIP TWICE DAILY FOR FINGERSTICK BS DX E11.9    . Cholecalciferol (VITAMIN D3) 50 MCG (2000 UT) TABS Take by mouth.    . finasteride (PROSCAR) 5 MG tablet Take by mouth. Takes 1/4th of 5 mg daily    . Magnesium 250 MG TABS Take by mouth.    . metFORMIN (GLUCOPHAGE) 1000 MG tablet Take 1 tablet by mouth 2 (two) times daily.    . Multiple Vitamins-Minerals (CENTRUM SILVER PO) Take by mouth.    . omega-3 acid ethyl esters (LOVAZA) 1 g capsule Take 2 capsules by mouth daily.    . Potassium 99 MG TABS Take by mouth.    . tadalafil (CIALIS) 5 MG tablet Take by mouth.    . vitamin B-12 (CYANOCOBALAMIN) 1000 MCG tablet Take by mouth.    . Zinc Acetate 50 MG CAPS Take by mouth.     No current facility-administered medications for this visit.    Patient confirms/reports the following allergies:  Allergies   Allergen Reactions  . Penicillin G Nausea Only    RXN AS A CHILD    No orders of the defined types were placed in this encounter.   AUTHORIZATION INFORMATION Primary Insurance: 1D#: Group #:  Secondary Insurance: 1D#: Group #:  SCHEDULE INFORMATION: Date: Thursday 02/21/20 Time: Location:[facility]

## 2020-02-19 ENCOUNTER — Other Ambulatory Visit: Payer: Self-pay

## 2020-02-19 ENCOUNTER — Other Ambulatory Visit
Admission: RE | Admit: 2020-02-19 | Discharge: 2020-02-19 | Disposition: A | Discharge status: Home / Self Care | Payer: Medicare Other | Source: Ambulatory Visit | Attending: Gastroenterology | Admitting: Gastroenterology

## 2020-02-19 DIAGNOSIS — U071 COVID-19: Secondary | ICD-10-CM | POA: Insufficient documentation

## 2020-02-19 DIAGNOSIS — Z01812 Encounter for preprocedural laboratory examination: Secondary | ICD-10-CM | POA: Diagnosis present

## 2020-02-19 LAB — SARS CORONAVIRUS 2 (TAT 6-24 HRS): SARS Coronavirus 2: POSITIVE — AB

## 2020-02-20 ENCOUNTER — Telehealth: Payer: Self-pay | Admitting: Physician Assistant

## 2020-02-20 ENCOUNTER — Encounter: Payer: Self-pay | Admitting: Adult Health

## 2020-02-20 ENCOUNTER — Telehealth: Payer: Self-pay

## 2020-02-20 ENCOUNTER — Telehealth: Payer: Self-pay | Admitting: Adult Health

## 2020-02-20 NOTE — Telephone Encounter (Signed)
Called to discuss with [name] about Covid symptoms and the use of bamlanivimab, a monoclonal antibody infusion for those with mild to moderate Covid symptoms and at a high risk of hospitalization.     Pt is qualified for this infusion at the [facility] infusion center due to co-morbid conditions and/or a member of an at-risk group, however declines infusion at this time. Symptoms tier reviewed as well as criteria for ending isolation.  Symptoms reviewed that would warrant ED/Hospital evaluation. Preventative practices reviewed. Patient verbalized understanding. Patient advised to call back if he decides that he does want to get infusion. Callback number to the infusion center given. Patient advised to go to Urgent care or ED with severe symptoms. Last date he would be eligible for infusion is 02/22/20.  Patient Active Problem List   Diagnosis Date Noted  . Nonrheumatic mitral (valve) insufficiency 11/21/2019  . Cerebrovascular disease 11/21/2019  . Pure hypercholesterolemia 11/21/2019  . Erectile dysfunction 11/21/2019  . Coronary arteriosclerosis 11/21/2019  . COPD, mild ([place]) 09/27/2019  . Type 2 diabetes mellitus without complication, without long-term current use of insulin ([place]) 09/27/2019  . Prostate cancer ([place]) 08/03/2019     Leanor Kail, PA - C

## 2020-02-20 NOTE — Telephone Encounter (Signed)
  I connected by phone with Alexander Dixon on 02/20/2020 at 6:05 PM to discuss the potential use of an new treatment for mild to moderate COVID-19 viral infection in non-hospitalized patients.  This patient is a 64 y.o. male that meets the FDA criteria for Emergency Use Authorization of bamlanivimab or casirivimab\imdevimab.   Has a (+) direct SARS-CoV-2 viral test result, 02/19/20 + Test- screened for pre-procedure.  Has mild or moderate COVID-19, current sx's productive cough, chills, and fatigue.  Is ? 64 years of age and weighs ? 40 kg  Is NOT hospitalized due to COVID-19  Is NOT requiring oxygen therapy or requiring an increase in baseline oxygen flow rate due to COVID-19  Is within 10 days of symptom onset, sx's began 02/17/20   Has at least one of the high risk factor(s) for progression to severe COVID-19 and/or hospitalization as defined in EUA.  Specific high risk criteria : >/= 66 yo, T2D   I have spoken and communicated the following to the patient or parent/caregiver:  1. FDA has authorized the emergency use of bamlanivimab and casirivimab\imdevimab for the treatment of mild to moderate COVID-19 in adults and pediatric patients with positive results of direct SARS-CoV-2 viral testing who are 62 years of age and older weighing at least 40 kg, and who are at high risk for progressing to severe COVID-19 and/or hospitalization.  2. The significant known and potential risks and benefits of bamlanivimab and casirivimab\imdevimab, and the extent to which such potential risks and benefits are unknown.  3. Information on available alternative treatments and the risks and benefits of those alternatives, including clinical trials.  4. Patients treated with bamlanivimab and casirivimab\imdevimab should continue to self-isolate and use infection control measures (e.g., wear mask, isolate, social distance, avoid sharing personal items, clean and disinfect "high touch" surfaces, and  frequent handwashing) according to CDC guidelines.   5. The patient or parent/caregiver has the option to accept or refuse bamlanivimab or casirivimab\imdevimab .  6.   Discussed Red Flag sx's and if any develop                      advised to seek immediate medical assistance.         Pt. Verbalized understanding/agreement.  After reviewing this information with the patient, The patient has DECLINED offer to receive the infusion.. Pt will call infusion center if sx's worsen, to schedule therapy. Contact information provided and will send MyChart message.  Alexander Dixon 02/20/2020 6:05 PM

## 2020-02-20 NOTE — Telephone Encounter (Signed)
Patient advised. Patient is aware of his results. Patient states he is feeling fine, he coughs up a little phlegm off and on but nothing severe. He went on a run this morning and feels fine. Patient was advised that he needs to quarantine for 10 days and to let us know if he develops symptoms or concerns. Advised patient to follow up with GI office on re scheduling his procedure.

## 2020-02-20 NOTE — Telephone Encounter (Signed)
Called to discuss with patient about Covid symptoms and the use of bamlanivimab or casirivimab/imdevimab, a monoclonal antibody infusion for those with mild to moderate Covid symptoms and at a high risk of hospitalization.  Pt is qualified for this infusion at the [facility] infusion center due to Age > 39 and DMT2.    Message left to call back and sent mychart message .  Angelena Form PA-C  [facility]

## 2020-02-20 NOTE — Telephone Encounter (Signed)
Please call patient to check in on symptoms. Offer appointment if needed.

## 2020-02-20 NOTE — Progress Notes (Signed)
Alexander Dixon/Alexander Dixon : COVID positive- cancel procedure, reschedule, inform PCP/Referring physician

## 2020-02-20 NOTE — Telephone Encounter (Signed)
Received a staff message that patients COVID Test is positive. I've canceled his colonoscopy with Trish in Endo.  LVM for pt to call me or Jadijah, CMA.    Message routed to Waunita Schooner his PCP.  Thanks,  [place], [place]

## 2020-02-21 ENCOUNTER — Ambulatory Visit: Admission: RE | Admit: 2020-02-21 | Payer: Medicare Other | Source: Home / Self Care | Admitting: Gastroenterology

## 2020-02-21 ENCOUNTER — Encounter: Admission: RE | Payer: Self-pay | Source: Home / Self Care

## 2020-02-21 SURGERY — COLONOSCOPY WITH PROPOFOL
Anesthesia: General

## 2020-03-10 DIAGNOSIS — E119 Type 2 diabetes mellitus without complications: Secondary | ICD-10-CM | POA: Diagnosis not present

## 2020-03-10 DIAGNOSIS — H401132 Primary open-angle glaucoma, bilateral, moderate stage: Secondary | ICD-10-CM | POA: Diagnosis not present

## 2020-03-10 LAB — HM DIABETES EYE EXAM

## 2020-03-12 ENCOUNTER — Encounter: Payer: Self-pay | Admitting: Ophthalmology

## 2020-03-12 DIAGNOSIS — M6289 Other specified disorders of muscle: Secondary | ICD-10-CM | POA: Diagnosis not present

## 2020-03-12 DIAGNOSIS — M6281 Muscle weakness (generalized): Secondary | ICD-10-CM | POA: Diagnosis not present

## 2020-03-12 DIAGNOSIS — M62838 Other muscle spasm: Secondary | ICD-10-CM | POA: Diagnosis not present

## 2020-03-24 DIAGNOSIS — H401132 Primary open-angle glaucoma, bilateral, moderate stage: Secondary | ICD-10-CM | POA: Diagnosis not present

## 2020-03-26 DIAGNOSIS — M6281 Muscle weakness (generalized): Secondary | ICD-10-CM | POA: Diagnosis not present

## 2020-03-26 DIAGNOSIS — M62838 Other muscle spasm: Secondary | ICD-10-CM | POA: Diagnosis not present

## 2020-03-27 ENCOUNTER — Other Ambulatory Visit: Payer: Self-pay

## 2020-03-27 ENCOUNTER — Telehealth (INDEPENDENT_AMBULATORY_CARE_PROVIDER_SITE_OTHER): Payer: Self-pay | Admitting: Gastroenterology

## 2020-03-27 ENCOUNTER — Other Ambulatory Visit: Payer: Medicare Other

## 2020-03-27 DIAGNOSIS — Z1211 Encounter for screening for malignant neoplasm of colon: Secondary | ICD-10-CM

## 2020-03-27 NOTE — Progress Notes (Signed)
Gastroenterology Pre-Procedure Review  Request Date: Monday 04/21/20 Requesting Physician: Dr. Vicente Males  PATIENT REVIEW QUESTIONS: The patient responded to the following health history questions as indicated:    1. Are you having any GI issues? no 2. Do you have a personal history of Polyps? no 3. Do you have a family history of Colon Cancer or Polyps? no 4. Diabetes Mellitus? yes (oral meds) 5. Joint replacements in the past 12 months?no 6. Major health problems in the past 3 months?no Prostate surgery 09/2019 7. Any artificial heart valves, MVP, or defibrillator?no    MEDICATIONS & ALLERGIES:    Patient reports the following regarding taking any anticoagulation/antiplatelet therapy:   Plavix, Coumadin, Eliquis, Xarelto, Lovenox, Pradaxa, Brilinta, or Effient? no Aspirin? yes (81 mg)  Patient confirms/reports the following medications:  Current Outpatient Medications  Medication Sig Dispense Refill  . Accu-Chek FastClix Lancets MISC USE 1 LANCET TWICE DAILY FOR FINGERSTICK BS TESTING DX E11.9    . ACCU-CHEK GUIDE test strip USE 1 TEST STRIP TWICE DAILY FOR FINGERSTICK BS DX E11.9    . Cholecalciferol (VITAMIN D3) 50 MCG (2000 UT) TABS Take by mouth.    . latanoprost (XALATAN) 0.005 % ophthalmic solution     . metFORMIN (GLUCOPHAGE) 1000 MG tablet Take 1 tablet by mouth 2 (two) times daily.    Marland Kitchen omega-3 acid ethyl esters (LOVAZA) 1 g capsule Take 2 capsules by mouth daily.    . Potassium 99 MG TABS Take by mouth.    . SENEXON-S 8.6-50 MG tablet Take 1 tablet by mouth 2 (two) times daily.    . finasteride (PROSCAR) 5 MG tablet Take by mouth. Takes 1/4th of 5 mg daily    . gabapentin (NEURONTIN) 100 MG capsule Take 100 mg by mouth every 8 (eight) hours.    . Magnesium 250 MG TABS Take by mouth.    . metFORMIN (GLUCOPHAGE) 500 MG tablet     . Multiple Vitamins-Minerals (CENTRUM SILVER PO) Take by mouth.    . oxybutynin (DITROPAN) 5 MG tablet Take 5 mg by mouth every 8 (eight) hours as  needed.    Manus Gunning BOWEL PREP KIT 17.5-3.13-1.6 GM/177ML SOLN SMARTSIG:1 Each By Mouth As Directed    . tadalafil (CIALIS) 5 MG tablet Take by mouth.    . Travoprost, BAK Free, (TRAVATAN) 0.004 % SOLN ophthalmic solution SMARTSIG:1 Drop(s) In Eye(s) Every Evening    . Travoprost, BAK Free, (TRAVATAN) 0.004 % SOLN ophthalmic solution     . vitamin B-12 (CYANOCOBALAMIN) 1000 MCG tablet Take by mouth.    . Zinc Acetate 50 MG CAPS Take by mouth.     No current facility-administered medications for this visit.    Patient confirms/reports the following allergies:  Allergies  Allergen Reactions  . Penicillin G Nausea Only    RXN AS A CHILD    No orders of the defined types were placed in this encounter.   AUTHORIZATION INFORMATION Primary Insurance: 1D#: Group #:  Secondary Insurance: 1D#: Group #:  SCHEDULE INFORMATION: Date: 04/21/20 Time: Location:[facility]

## 2020-04-03 ENCOUNTER — Ambulatory Visit (INDEPENDENT_AMBULATORY_CARE_PROVIDER_SITE_OTHER): Payer: Medicare Other | Admitting: Family Medicine

## 2020-04-03 ENCOUNTER — Other Ambulatory Visit: Payer: Self-pay

## 2020-04-03 ENCOUNTER — Encounter: Payer: Self-pay | Admitting: Family Medicine

## 2020-04-03 VITALS — BP 142/76 | HR 96 | Temp 97.9°F | Resp 18 | Ht 71.0 in | Wt 204.2 lb

## 2020-04-03 DIAGNOSIS — E119 Type 2 diabetes mellitus without complications: Secondary | ICD-10-CM | POA: Diagnosis not present

## 2020-04-03 DIAGNOSIS — R030 Elevated blood-pressure reading, without diagnosis of hypertension: Secondary | ICD-10-CM | POA: Insufficient documentation

## 2020-04-03 DIAGNOSIS — D649 Anemia, unspecified: Secondary | ICD-10-CM | POA: Diagnosis not present

## 2020-04-03 DIAGNOSIS — E7800 Pure hypercholesterolemia, unspecified: Secondary | ICD-10-CM

## 2020-04-03 DIAGNOSIS — Z8546 Personal history of malignant neoplasm of prostate: Secondary | ICD-10-CM | POA: Diagnosis not present

## 2020-04-03 LAB — MICROALBUMIN / CREATININE URINE RATIO
Creatinine,U: 133.7 mg/dL
Microalb Creat Ratio: 0.6 mg/g (ref 0.0–30.0)
Microalb, Ur: 0.8 mg/dL (ref 0.0–1.9)

## 2020-04-03 NOTE — Assessment & Plan Note (Signed)
Will get records from [facility]. Told he does not need to see their office unless PSA elevated. Recommend q6 months x 1 year then annually. Will check today

## 2020-04-03 NOTE — Progress Notes (Signed)
Subjective:     Alexander Dixon is a 64 y.o. male presenting for Diabetes (follow up and fasting labs)     HPI  #Diabetes Currently taking: metformin [phone] mg twice daily Using medications without difficulties: No Hypoglycemic episodes:No  Hyperglycemic episodes:No  Feet problems:No  Blood Sugars averaging: 130-155 Last HgbA1c:  Lab Results  Component Value Date   HGBA1C 7.6 (H) 12/27/2019    Diabetes Health Maintenance Due:    Diabetes Health Maintenance Due  Topic Date Due  . URINE MICROALBUMIN  Never done  . HEMOGLOBIN A1C  06/25/2020  . OPHTHALMOLOGY EXAM  03/10/2021  . FOOT EXAM  04/03/2021    #Hx of prostate cancer - s/p surgery November 2020 - going to PT for incontinence - seeing [facility] - advised that I check his psa   Review of Systems   Social History   Tobacco Use  Smoking Status Former Smoker  . Packs/day: 1.00  . Years: 30.00  . Pack years: 30.00  . Types: Cigarettes  . Quit date: 12/2000  . Years since quitting: 19.2  Smokeless Tobacco Never Used        Objective:    BP Readings from Last 3 Encounters:  04/03/20 (!) 142/76  01/03/20 132/80  11/05/19 118/62   Wt Readings from Last 3 Encounters:  04/03/20 204 lb 4 oz (92.6 kg)  01/03/20 202 lb 8 oz (91.9 kg)  12/26/19 198 lb (89.8 kg)    BP (!) 142/76   Pulse 96   Temp 97.9 F (36.6 C)   Resp 18   Ht 5\' 11"  (1.803 m)   Wt 204 lb 4 oz (92.6 kg)   SpO2 96%   BMI 28.49 kg/m    Physical Exam Constitutional:      Appearance: Normal appearance. He is not ill-appearing or diaphoretic.  HENT:     Right Ear: External ear normal.     Left Ear: External ear normal.     Nose: Nose normal.  Eyes:     General: No scleral icterus.    Extraocular Movements: Extraocular movements intact.     Conjunctiva/sclera: Conjunctivae normal.  Cardiovascular:     Rate and Rhythm: Normal rate and regular rhythm.     Heart sounds: No murmur.  Pulmonary:     Effort:  Pulmonary effort is normal. No respiratory distress.     Breath sounds: Normal breath sounds. No wheezing.  Musculoskeletal:     Cervical back: Neck supple.  Skin:    General: Skin is warm and dry.  Neurological:     Mental Status: He is alert. Mental status is at baseline.  Psychiatric:        Mood and Affect: Mood normal.     Diabetic Foot Exam - Simple   Simple Foot Form Diabetic Foot exam was performed with the following findings: Yes 04/03/2020  2:58 PM  Visual Inspection No deformities, no ulcerations, no other skin breakdown bilaterally: Yes Sensation Testing Intact to touch and monofilament testing bilaterally: Yes Pulse Check Posterior Tibialis and Dorsalis pulse intact bilaterally: Yes Comments          Assessment & Plan:   Problem List Items Addressed This Visit      Endocrine   Type 2 diabetes mellitus without complication, without long-term current use of insulin ([place]) - Primary    Repeat Hgb A1c today. Has been working on lifestyle. Is occasionally only taking 500 mg of metformin based on meal size. Based on lab results, will  make changes.       Relevant Orders   Microalbumin/Creatinine Ratio, Urine   Comprehensive metabolic panel   Hemoglobin A1c     Other   History of prostate cancer    Will get records from [facility]. Told he does not need to see their office unless PSA elevated. Recommend q6 months x 1 year then annually. Will check today      Relevant Orders   PSA   Pure hypercholesterolemia    Previously declined medication. Pt with ASCVD 24% and hx of vascular disease. Would benefit from statin. Repeat fasting lipids today.        Relevant Orders   Lipid panel   Elevated BP without diagnosis of hypertension    Home monitoring and return in 3 months if elevated at home. 6 months if normal. Will plan to start ACE if elevated.        Other Visit Diagnoses    Anemia, unspecified type       Relevant Orders   CBC       Return in about  3 months (around 07/04/2020).  Lesleigh Noe, MD

## 2020-04-03 NOTE — Assessment & Plan Note (Addendum)
Previously declined medication. Pt with ASCVD 24% and hx of vascular disease. Would benefit from statin. Repeat fasting lipids today.

## 2020-04-03 NOTE — Assessment & Plan Note (Signed)
Home monitoring and return in 3 months if elevated at home. 6 months if normal. Will plan to start ACE if elevated.

## 2020-04-03 NOTE — Assessment & Plan Note (Signed)
Repeat Hgb A1c today. Has been working on lifestyle. Is occasionally only taking 500 mg of metformin based on meal size. Based on lab results, will make changes.

## 2020-04-03 NOTE — Patient Instructions (Signed)
#   Blood work today  Your blood pressure high.   High blood pressure increases your risk for heart attack and stroke.    Please check your blood pressure 2-4 times a week.   To check your blood pressure 1) Sit in a quiet and relaxed place for 5 minutes 2) Make sure your feet are flat on the ground 3) Consider checking first thing in the morning   Normal blood pressure is less than 140/90 Ideally you blood pressure should be around 120/80  Other ways you can reduce your blood pressure:  1) Regular exercise -- Try to get 150 minutes (30 minutes, 5 days a week) of moderate to vigorous aerobic excercise -- Examples: brisk walking (2.5 miles per hour), water aerobics, dancing, gardening, tennis, biking slower than 10 miles per hour 2) DASH Diet - low fat meats, more fresh fruits and vegetables, whole grains, low salt 3) Quit smoking if you smoke 4) Loose 5-10% of your body weight

## 2020-04-04 LAB — COMPREHENSIVE METABOLIC PANEL
ALT: 13 U/L (ref 0–53)
AST: 15 U/L (ref 0–37)
Albumin: 4.4 g/dL (ref 3.5–5.2)
Alkaline Phosphatase: 76 U/L (ref 39–117)
BUN: 23 mg/dL (ref 6–23)
CO2: 33 mEq/L — ABNORMAL HIGH (ref 19–32)
Calcium: 9.9 mg/dL (ref 8.4–10.5)
Chloride: 104 mEq/L (ref 96–112)
Creatinine, Ser: 1.01 mg/dL (ref 0.40–1.50)
GFR: 74.05 mL/min (ref >60.00)
Glucose, Bld: 132 mg/dL — ABNORMAL HIGH (ref 70–99)
Potassium: 4.8 mEq/L (ref 3.5–5.1)
Sodium: 141 mEq/L (ref 135–145)
Total Bilirubin: 0.4 mg/dL (ref 0.2–1.2)
Total Protein: 7.4 g/dL (ref 6.0–8.3)

## 2020-04-04 LAB — LIPID PANEL
Cholesterol: 192 mg/dL (ref 0–200)
HDL: 42.9 mg/dL (ref >39.00)
LDL Cholesterol: 119 mg/dL — ABNORMAL HIGH (ref 0–99)
NonHDL: 148.8
Total CHOL/HDL Ratio: 4
Triglycerides: 150 mg/dL — ABNORMAL HIGH (ref 0.0–149.0)
VLDL: 30 mg/dL (ref 0.0–40.0)

## 2020-04-04 LAB — CBC
HCT: 42.6 % (ref 39.0–52.0)
Hemoglobin: 14.1 g/dL (ref 13.0–17.0)
MCHC: 33.2 g/dL (ref 30.0–36.0)
MCV: 84 fl (ref 78.0–100.0)
Platelets: 269 10*3/uL (ref 150.0–400.0)
RBC: 5.07 Mil/uL (ref 4.22–5.81)
RDW: 15 % (ref 11.5–15.5)
WBC: 7.7 10*3/uL (ref 4.0–10.5)

## 2020-04-04 LAB — PSA: PSA: 0 ng/mL — ABNORMAL LOW (ref 0.10–4.00)

## 2020-04-04 LAB — HEMOGLOBIN A1C: Hgb A1c MFr Bld: 7.7 % — ABNORMAL HIGH (ref 4.6–6.5)

## 2020-04-10 DIAGNOSIS — M62838 Other muscle spasm: Secondary | ICD-10-CM | POA: Diagnosis not present

## 2020-04-10 DIAGNOSIS — M6281 Muscle weakness (generalized): Secondary | ICD-10-CM | POA: Diagnosis not present

## 2020-04-21 ENCOUNTER — Ambulatory Visit
Admission: RE | Admit: 2020-04-21 | Discharge: 2020-04-21 | Disposition: A | Discharge status: Home / Self Care | Payer: Medicare Other | Attending: Gastroenterology | Admitting: Gastroenterology

## 2020-04-21 ENCOUNTER — Encounter
Admission: RE | Disposition: A | Discharge status: Home / Self Care | Payer: Self-pay | Source: Home / Self Care | Attending: Gastroenterology

## 2020-04-21 ENCOUNTER — Ambulatory Visit: Payer: Medicare Other | Admitting: Registered Nurse

## 2020-04-21 DIAGNOSIS — Z7984 Long term (current) use of oral hypoglycemic drugs: Secondary | ICD-10-CM | POA: Insufficient documentation

## 2020-04-21 DIAGNOSIS — Z87891 Personal history of nicotine dependence: Secondary | ICD-10-CM | POA: Diagnosis not present

## 2020-04-21 DIAGNOSIS — Z8546 Personal history of malignant neoplasm of prostate: Secondary | ICD-10-CM | POA: Diagnosis not present

## 2020-04-21 DIAGNOSIS — Z538 Procedure and treatment not carried out for other reasons: Secondary | ICD-10-CM | POA: Diagnosis not present

## 2020-04-21 DIAGNOSIS — E119 Type 2 diabetes mellitus without complications: Secondary | ICD-10-CM | POA: Diagnosis not present

## 2020-04-21 DIAGNOSIS — Z1211 Encounter for screening for malignant neoplasm of colon: Secondary | ICD-10-CM | POA: Insufficient documentation

## 2020-04-21 HISTORY — PX: COLONOSCOPY WITH PROPOFOL: SHX5780

## 2020-04-21 LAB — GLUCOSE, CAPILLARY: Glucose-Capillary: 101 mg/dL — ABNORMAL HIGH (ref 70–99)

## 2020-04-21 SURGERY — COLONOSCOPY WITH PROPOFOL
Anesthesia: General

## 2020-04-21 MED ORDER — LIDOCAINE HCL (PF) 2 % IJ SOLN
INTRAMUSCULAR | Status: AC
  Filled 2020-04-21: qty 5

## 2020-04-21 MED ORDER — LIDOCAINE HCL (PF) 1 % IJ SOLN
INTRAMUSCULAR | Status: AC
  Filled 2020-04-21: qty 2

## 2020-04-21 MED ORDER — PROPOFOL 10 MG/ML IV BOLUS
INTRAVENOUS | Status: DC | PRN
  Administered 2020-04-21: 80 mg via INTRAVENOUS

## 2020-04-21 MED ORDER — LIDOCAINE HCL (CARDIAC) PF 100 MG/5ML IV SOSY
PREFILLED_SYRINGE | INTRAVENOUS | Status: DC | PRN
  Administered 2020-04-21: 80 mg via INTRAVENOUS

## 2020-04-21 MED ORDER — SODIUM CHLORIDE 0.9 % IV SOLN
INTRAVENOUS | Status: DC
  Administered 2020-04-21: 1000 mL via INTRAVENOUS

## 2020-04-21 MED ORDER — PROPOFOL 10 MG/ML IV BOLUS
INTRAVENOUS | Status: AC
  Filled 2020-04-21: qty 20

## 2020-04-21 MED ORDER — PROPOFOL 500 MG/50ML IV EMUL
INTRAVENOUS | Status: DC | PRN
  Administered 2020-04-21: 150 ug/kg/min via INTRAVENOUS

## 2020-04-21 NOTE — Anesthesia Postprocedure Evaluation (Signed)
Anesthesia Post Note  Patient: Alexander Dixon  Procedure(s) Performed: COLONOSCOPY WITH PROPOFOL (N/A )  Patient location during evaluation: Endoscopy Anesthesia Type: General Level of consciousness: awake and alert and oriented Pain management: pain level controlled Vital Signs Assessment: post-procedure vital signs reviewed and stable Respiratory status: spontaneous breathing, nonlabored ventilation and respiratory function stable Cardiovascular status: blood pressure returned to baseline and stable Postop Assessment: no signs of nausea or vomiting Anesthetic complications: no     Last Vitals:  Vitals:   04/21/20 0946 04/21/20 0956  BP: 129/72 (!) 143/82  Pulse: 60 (!) 56  Resp: 16 14  Temp: (!) 36.2 C   SpO2: 96% 96%    Last Pain:  Vitals:   04/21/20 0956  TempSrc:   PainSc: 0-No pain                 Jaion Lagrange

## 2020-04-21 NOTE — Op Note (Signed)
[facility] Gastroenterology Patient Name: Alexander Dixon Procedure Date: 04/21/2020 9:23 AM MRN: [id number] Account #: [id number] Date of Birth: [birth year] Admit Type: Outpatient Age: 64 Room: [facility] ENDO ROOM 4 Gender: Male Note Status: Finalized Procedure:             Colonoscopy Indications:           Screening for colorectal malignant neoplasm Providers:             Jonathon Bellows MD, MD Medicines:             Monitored Anesthesia Care Complications:         No immediate complications. Procedure:             Pre-Anesthesia Assessment:                        - Prior to the procedure, a History and Physical was                         performed, and patient medications, allergies and                         sensitivities were reviewed. The patient's tolerance                         of previous anesthesia was reviewed.                        - The risks and benefits of the procedure and the                         sedation options and risks were discussed with the                         patient. All questions were answered and informed                         consent was obtained.                        - ASA Grade Assessment: II - A patient with mild                         systemic disease.                        After obtaining informed consent, the colonoscope was                         passed under direct vision. Throughout the procedure,                         the patient's blood pressure, pulse, and oxygen                         saturations were monitored continuously. The                         Colonoscope was introduced through the anus with the  intention of advancing to the cecum. The scope was                         advanced to the sigmoid colon before the procedure was                         aborted. Medications were given. The colonoscopy was                         performed with ease. The patient tolerated the                    procedure well. The quality of the bowel preparation                         was poor. Findings:      The perianal and digital rectal examinations were normal.      A large amount of stool was found in the rectum and in the sigmoid       colon, interfering with visualization. Impression:            - Preparation of the colon was poor.                        - Stool in the rectum and in the sigmoid colon.                        - No specimens collected. Recommendation:        - Discharge patient to home (with escort).                        - Resume previous diet.                        - Continue present medications.                        - Repeat colonoscopy in 2 weeks because the bowel                         preparation was suboptimal. Procedure Code(s):     --- Professional ---                        [phone], 53, Colonoscopy, flexible; diagnostic,                         including collection of specimen(s) by brushing or                         washing, when performed (separate procedure) Diagnosis Code(s):     --- Professional ---                        Z12.11, Encounter for screening for malignant neoplasm                         of colon CPT copyright 2019 American Medical Association. All rights reserved. The codes documented in this report are preliminary and upon coder review may  be revised to meet current compliance requirements. Bailey Mech  Vicente Males, MD Jonathon Bellows MD, MD 04/21/2020 9:41:19 AM This report has been signed electronically. Number of Addenda: 0 Note Initiated On: 04/21/2020 9:23 AM Total Procedure Duration: 0 hours 1 minute 12 seconds  Estimated Blood Loss:  Estimated blood loss: none.      [facility]

## 2020-04-21 NOTE — H&P (Signed)
Jonathon Bellows, MD [address] [place], [place], [place], [address] [address], [place], [place], [place], [address] Phone: [phone]  Fax: [phone]  Primary Care Physician:  Lesleigh Noe, MD   Pre-Procedure History & Physical: HPI:  Alexander Dixon is a 64 y.o. male is here for an colonoscopy.   Past Medical History:  Diagnosis Date  . Diabetes mellitus without complication ([place])   . History of chicken pox   . Prostate cancer [facility])     Past Surgical History:  Procedure Laterality Date  . HERNIA REPAIR     inguinal  . KNEE ARTHROSCOPY Right 1984  . PROSTATE SURGERY     due to prostate cancer    Prior to Admission medications   Medication Sig Start Date End Date Taking? Authorizing Provider  Accu-Chek FastClix Lancets MISC USE 1 LANCET TWICE DAILY FOR FINGERSTICK BS TESTING DX E11.9 06/25/19   [provider]  ACCU-CHEK GUIDE test strip USE 1 TEST STRIP TWICE DAILY FOR FINGERSTICK BS DX E11.9 08/02/19   [provider]  Cholecalciferol (VITAMIN D3) 50 MCG (2000 UT) TABS Take by mouth.    [provider]  latanoprost (XALATAN) 0.005 % ophthalmic solution  03/24/20   [provider]  metFORMIN (GLUCOPHAGE) 1000 MG tablet Take 1 tablet by mouth 2 (two) times daily.    [provider]  omega-3 acid ethyl esters (LOVAZA) 1 g capsule Take 2 capsules by mouth daily. 10/14/19   [provider]  tadalafil (CIALIS) 5 MG tablet Take by mouth. 10/22/19 04/03/20  [provider]    Allergies as of 03/28/2020 - Review Complete 03/27/2020  Allergen Reaction Noted  . Penicillin g Nausea Only 11/13/2014    Family History  Problem Relation Age of Onset  . Leukemia Mother   . Arthritis Mother   . Diabetes Mother   . Alcohol abuse Father   . COPD Father   . Arthritis Maternal Grandmother   . Diabetes Maternal Grandmother   . Hearing loss Maternal Grandmother   . Arthritis Maternal Grandfather   . Heart  attack Maternal Grandfather 52  . Diabetes Maternal Grandfather   . Heart disease Maternal Grandfather   . Hyperlipidemia Maternal Grandfather   . Hypertension Maternal Grandfather   . Prostate cancer Maternal Grandfather   . Prostate cancer Maternal Uncle   . Prostate cancer Cousin     Social History   Socioeconomic History  . Marital status: Married    Spouse name: [name]  . Number of children: 2  . Years of education: associates degree  . Highest education level: Not on file  Occupational History  . Not on file  Tobacco Use  . Smoking status: Former Smoker    Packs/day: 1.00    Years: 30.00    Pack years: 30.00    Types: Cigarettes    Quit date: 12/2000    Years since quitting: 19.3  . Smokeless tobacco: Never Used  Substance and Sexual Activity  . Alcohol use: Not Currently    Comment: once in a blue moon  . Drug use: Never  . Sexual activity: Yes    Birth control/protection: Post-menopausal, Surgical  Other Topics Concern  . 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 Difficulty of Paying Living Expenses: Not hard at all  Food Insecurity: No Food Insecurity  . Worried About Charity fundraiser in the Last Year: Never true  . Ran Out of Food in the Last Year: Never true  Transportation Needs: No Transportation Needs  . Lack of Transportation (Medical): No  . Lack of Transportation (Non-Medical): No  Physical Activity: Sufficiently  Active  . Days of Exercise per Week: 5 days  . Minutes of Exercise per Session: 60 min  Stress: No Stress Concern Present  . Feeling of Stress : Not at all  Social Connections:   . Frequency of Communication with Friends and Family:   . Frequency of Social Gatherings with Friends and Family:   . Attends Religious Services:   . Active Member of Clubs or Organizations:   . Attends Archivist Meetings:   Marland Kitchen Marital Status:   Intimate Partner Violence: Not At Risk  . Fear of Current or Ex-Partner: No  . Emotionally Abused: No  . Physically Abused: No  . Sexually Abused: No    Review of Systems: See HPI, otherwise negative ROS  Physical Exam: BP (!) 145/84   Pulse 71   Temp (!) 96.5 F (35.8 C) (Temporal)   Resp 17   Ht 5\' 11"  (1.803 m)   Wt 86.2 kg   SpO2 98%   BMI 26.50 kg/m  General:   Alert,  pleasant and cooperative in NAD Head:  Normocephalic and atraumatic. Neck:  Supple; no masses or thyromegaly. Lungs:  Clear throughout to auscultation, normal respiratory effort.    Heart:  +S1, +S2, Regular rate and rhythm, No edema. Abdomen:  Soft, nontender and nondistended. Normal bowel sounds, without guarding, and without rebound.   Neurologic:  Alert and  oriented x4;  grossly normal neurologically.  Impression/Plan: Alexander Dixon is here for an colonoscopy to be performed for Screening colonoscopy average risk   Risks, benefits, limitations, and alternatives regarding  colonoscopy have been reviewed with the patient.  Questions have been answered.  All parties agreeable.   Jonathon Bellows, MD  04/21/2020, 9:23 AM

## 2020-04-21 NOTE — Anesthesia Preprocedure Evaluation (Signed)
Anesthesia Evaluation  Patient identified by MRN, date of birth, ID band Patient awake    Reviewed: Allergy & Precautions, NPO status , Patient's Chart, lab work & pertinent test results  History of Anesthesia Complications Negative for: history of anesthetic complications  Airway Mallampati: II  TM Distance: >3 FB Neck ROM: Full    Dental no notable dental hx.    Pulmonary neg sleep apnea, COPD,  COPD inhaler, former smoker,    breath sounds clear to auscultation- rhonchi (-) wheezing      Cardiovascular (-) hypertension(-) CAD, (-) Past MI, (-) Cardiac Stents and (-) CABG  Rhythm:Regular Rate:Normal - Systolic murmurs and - Diastolic murmurs    Neuro/Psych neg Seizures negative neurological ROS  negative psych ROS   GI/Hepatic negative GI ROS, Neg liver ROS,   Endo/Other  diabetes, Oral Hypoglycemic Agents  Renal/GU negative Renal ROS     Musculoskeletal negative musculoskeletal ROS (+)   Abdominal (+) - obese,   Peds  Hematology negative hematology ROS (+)   Anesthesia Other Findings Past Medical History: No date: Diabetes mellitus without complication (H[facility]) No date: History of chicken pox No date: Prostate cancer ([facility])   Reproductive/Obstetrics                             Anesthesia Physical Anesthesia Plan  ASA: II  Anesthesia Plan: General   Post-op Pain Management:    Induction: Intravenous  PONV Risk Score and Plan: 1 and Propofol infusion  Airway Management Planned: Natural Airway  Additional Equipment:   Intra-op Plan:   Post-operative Plan:   Informed Consent: I have reviewed the patients History and Physical, chart, labs and discussed the procedure including the risks, benefits and alternatives for the proposed anesthesia with the patient or authorized representative who has indicated his/her understanding and acceptance.     Dental advisory given  Plan  Discussed with: CRNA and Anesthesiologist  Anesthesia Plan Comments:         Anesthesia Quick Evaluation

## 2020-04-21 NOTE — Transfer of Care (Signed)
Immediate Anesthesia Transfer of Care Note  Patient: Alexander Dixon  Procedure(s) Performed: COLONOSCOPY WITH PROPOFOL (N/A )  Patient Location: PACU on Endoscopy Unit  Anesthesia Type:General  Level of Consciousness: drowsy  Airway & Oxygen Therapy: Patient Spontanous Breathing  Post-op Assessment: Report given to RN and Post -op Vital signs reviewed and stable  Post vital signs: Reviewed and stable  Last Vitals:  Vitals Value Taken Time  BP 129/72 04/21/20 0944  Temp    Pulse 66 04/21/20 0945  Resp 14 04/21/20 0945  SpO2 96 % 04/21/20 0945  Vitals shown include unvalidated device data.  Last Pain:  Vitals:   04/21/20 0842  TempSrc: Temporal  PainSc: 0-No pain         Complications: No apparent anesthesia complications

## 2020-04-23 ENCOUNTER — Encounter: Payer: Self-pay | Admitting: *Deleted

## 2020-04-30 DIAGNOSIS — M6281 Muscle weakness (generalized): Secondary | ICD-10-CM | POA: Diagnosis not present

## 2020-04-30 DIAGNOSIS — M62838 Other muscle spasm: Secondary | ICD-10-CM | POA: Diagnosis not present

## 2020-05-07 ENCOUNTER — Encounter: Payer: Self-pay | Admitting: Family Medicine

## 2020-05-07 DIAGNOSIS — I6521 Occlusion and stenosis of right carotid artery: Secondary | ICD-10-CM | POA: Insufficient documentation

## 2020-06-04 DIAGNOSIS — M62838 Other muscle spasm: Secondary | ICD-10-CM | POA: Diagnosis not present

## 2020-06-04 DIAGNOSIS — M6281 Muscle weakness (generalized): Secondary | ICD-10-CM | POA: Diagnosis not present

## 2020-06-16 ENCOUNTER — Telehealth: Payer: Self-pay | Admitting: *Deleted

## 2020-06-16 NOTE — Telephone Encounter (Signed)
Sony NP with [facility] calls left a voicemail stating that she did a PAD screening on patient. Sony stated that his right leg showed a moderate abnormal reading. Sony stated that she  just wanted to let Dr. Einar Pheasant know this and if she has any questions to call her.

## 2020-06-16 NOTE — Telephone Encounter (Signed)
Please clarify what tool she used for screening for PAD and if results can be faxed to our office.

## 2020-06-18 ENCOUNTER — Other Ambulatory Visit: Payer: Self-pay

## 2020-06-18 NOTE — Telephone Encounter (Signed)
Patient came into office sating that in his previous Dr visit the Dr always ask if he needs any medication refills and he is now almost out of a medication from another Dr. He has two days left of this medication and is needing a refill if Dr find appropriate   Does the patient need to schedule appt? Patient says Dr knows he takes this medication. Patient is also asking for a 90 day supply  Medication  metFORMIN (GLUCOPHAGE) 1000 MG tablet   [place], [place], [address]

## 2020-06-19 NOTE — Telephone Encounter (Signed)
Patient called office stating he needs a refill for metFormin 1000 MG, which he takes twice a day. He stated he uses [facility] and was suppose to get a 90 day refill sent to him but has not. He is running low of the prescription and isnt sure if he needs to be seen in office in order to get refill or not. Please contact patient to let him know the status of the refill and if he needs to get scheduled for an appointment.

## 2020-06-20 MED ORDER — METFORMIN HCL 1000 MG PO TABS: 1000.0000 mg | ORAL_TABLET | Freq: Two times a day (BID) | ORAL | 0 refills | Status: DC

## 2020-06-20 NOTE — Telephone Encounter (Signed)
I called the pt in reference to his request for a 90 day refill of Metformin.  His last OV: 04/03/20 Was told then he needed to follow up in 3 months, but no future appts are scheduled. Pt stated that he thought someone would call him to schedule that appt.  I offered to send in a 30 day supply to the local pharmacy until he came in for a follow-up and pt got angry and said he would just find another Dr if we couldn't meet his needs.  Please advise.

## 2020-06-20 NOTE — Telephone Encounter (Signed)
Contacted patient and scheduled for 3 month follow up for medication refill.

## 2020-06-20 NOTE — Telephone Encounter (Signed)
I called Sony with [facility] and left a VM asking her to call me back.

## 2020-06-23 NOTE — Telephone Encounter (Signed)
Sony called back and stated that they use [employer] flow as their tool for screening and she will put in the request to have those results faxed to [place].

## 2020-07-08 ENCOUNTER — Ambulatory Visit (INDEPENDENT_AMBULATORY_CARE_PROVIDER_SITE_OTHER): Payer: Medicare Other | Admitting: Family Medicine

## 2020-07-08 ENCOUNTER — Other Ambulatory Visit: Payer: Self-pay

## 2020-07-08 VITALS — BP 130/72 | HR 68 | Temp 97.8°F | Ht 71.0 in | Wt 193.0 lb

## 2020-07-08 DIAGNOSIS — E119 Type 2 diabetes mellitus without complications: Secondary | ICD-10-CM | POA: Diagnosis not present

## 2020-07-08 DIAGNOSIS — R229 Localized swelling, mass and lump, unspecified: Secondary | ICD-10-CM | POA: Diagnosis not present

## 2020-07-08 DIAGNOSIS — I6521 Occlusion and stenosis of right carotid artery: Secondary | ICD-10-CM

## 2020-07-08 DIAGNOSIS — E7800 Pure hypercholesterolemia, unspecified: Secondary | ICD-10-CM

## 2020-07-08 LAB — POCT GLYCOSYLATED HEMOGLOBIN (HGB A1C): Hemoglobin A1C: 7.6 % — AB (ref 4.0–5.6)

## 2020-07-08 MED ORDER — ATORVASTATIN CALCIUM 10 MG PO TABS: 10.0000 mg | ORAL_TABLET | Freq: Every day | ORAL | 3 refills | Status: DC

## 2020-07-08 NOTE — Assessment & Plan Note (Signed)
Agreed to start atorvastatin.

## 2020-07-08 NOTE — Assessment & Plan Note (Signed)
Mild improvement in HgbA1c with regular metformin adherence. Advised adding another medication, but he would like to focus on diet and repeat in 3 months.

## 2020-07-08 NOTE — Patient Instructions (Addendum)
#  Referral I have placed a referral to a specialist for you. You should receive a phone call from the specialty office. Make sure your voicemail is not full and that if you are able to answer your phone to unknown or new numbers.   It may take up to 2 weeks to hear about the referral. If you do not hear anything in 2 weeks, please call our office and ask to speak with the referral coordinator.    #Diabetes - avoid sugary foods - continue metformin twice daily  - return in 3 months  Start cholesterol medication

## 2020-07-08 NOTE — Assessment & Plan Note (Signed)
Wonder about possible sebaceous cyst with recurrent infection. Unable to palpate today on exam but will refer to dermatology given recurrent nature.

## 2020-07-08 NOTE — Progress Notes (Signed)
Subjective:     Alexander Dixon is a 64 y.o. male presenting for Follow-up (3 month ) and Referral (for dermatologist (reoccuring knot on right chin))     HPI  #Diabetes Currently taking metformin (Glucophage, Riomet)  Using medications without difficulties: No Hypoglycemic episodes:No  Hyperglycemic episodes:No  Feet problems:No  Blood Sugars averaging: does not check Last HgbA1c:  Lab Results  Component Value Date   HGBA1C 7.6 (A) 07/08/2020   Diet: feels he is doing better, but has been eating a lot of watermelons and cantaloupe   Diabetes Health Maintenance Due:    Diabetes Health Maintenance Due  Topic Date Due  . HEMOGLOBIN A1C  01/08/2021  . OPHTHALMOLOGY EXAM  03/10/2021  . FOOT EXAM  04/03/2021  . URINE MICROALBUMIN  04/03/2021   #Chin lesion - for months - recurrent - will increase in size, and with compression will drain foul smelling fluid - every few days is increasing in size and draining    Review of Systems  04/03/2020: Clinic - DM - Hgb A1c elevated - advised regular metformin use. BP mildly elevated - home monitoring.   Social History   Tobacco Use  Smoking Status Former Smoker  . Packs/day: 1.00  . Years: 30.00  . Pack years: 30.00  . Types: Cigarettes  . Quit date: 12/2000  . Years since quitting: 19.5  Smokeless Tobacco Never Used        Objective:    BP Readings from Last 3 Encounters:  07/08/20 130/72  04/21/20 (!) 169/83  04/03/20 (!) 142/76   Wt Readings from Last 3 Encounters:  07/08/20 193 lb (87.5 kg)  04/21/20 190 lb (86.2 kg)  04/03/20 204 lb 4 oz (92.6 kg)    BP 130/72   Pulse 68   Temp 97.8 F (36.6 C) (Temporal)   Ht 5\' 11"  (1.803 m)   Wt 193 lb (87.5 kg)   SpO2 94%   BMI 26.92 kg/m    Physical Exam Constitutional:      Appearance: Normal appearance. He is not ill-appearing or diaphoretic.  HENT:     Right Ear: External ear normal.     Left Ear: External ear normal.     Nose: Nose normal.    Eyes:     General: No scleral icterus.    Extraocular Movements: Extraocular movements intact.     Conjunctiva/sclera: Conjunctivae normal.  Cardiovascular:     Rate and Rhythm: Normal rate and regular rhythm.     Heart sounds: No murmur heard.   Pulmonary:     Effort: Pulmonary effort is normal. No respiratory distress.     Breath sounds: Normal breath sounds. No wheezing.  Musculoskeletal:     Cervical back: Neck supple.  Skin:    General: Skin is warm and dry.  Neurological:     Mental Status: He is alert. Mental status is at baseline.  Psychiatric:        Mood and Affect: Mood normal.          Assessment & Plan:   Problem List Items Addressed This Visit      Cardiovascular and Mediastinum   Mild atherosclerosis of carotid artery, right    Taking a baby ASA and fish oil. Discussed benefit of statin for prevention of MI/Stroke and advised starting. He agreed to start atorvastatin.       Relevant Medications   aspirin EC 81 MG tablet   atorvastatin (LIPITOR) 10 MG tablet     Endocrine  Type 2 diabetes mellitus without complication, without long-term current use of insulin (HCC) - Primary    Mild improvement in HgbA1c with regular metformin adherence. Advised adding another medication, but he would like to focus on diet and repeat in 3 months.       Relevant Medications   aspirin EC 81 MG tablet   atorvastatin (LIPITOR) 10 MG tablet   Other Relevant Orders   POCT glycosylated hemoglobin (Hb A1C) (Completed)     Other   Pure hypercholesterolemia    Agreed to start atorvastatin.       Relevant Medications   aspirin EC 81 MG tablet   atorvastatin (LIPITOR) 10 MG tablet   Skin mass    Wonder about possible sebaceous cyst with recurrent infection. Unable to palpate today on exam but will refer to dermatology given recurrent nature.       Relevant Orders   Ambulatory referral to Dermatology       Return in about 3 months (around 10/08/2020).  Lesleigh Noe, MD  This visit occurred during the SARS-CoV-2 public health emergency.  Safety protocols were in place, including screening questions prior to the visit, additional usage of staff PPE, and extensive cleaning of exam room while observing appropriate contact time as indicated for disinfecting solutions.

## 2020-07-08 NOTE — Assessment & Plan Note (Addendum)
Taking a baby ASA and fish oil. Discussed benefit of statin for prevention of MI/Stroke and advised starting. He agreed to start atorvastatin.

## 2020-07-23 DIAGNOSIS — M62838 Other muscle spasm: Secondary | ICD-10-CM | POA: Diagnosis not present

## 2020-07-23 DIAGNOSIS — M6281 Muscle weakness (generalized): Secondary | ICD-10-CM | POA: Diagnosis not present

## 2020-08-08 ENCOUNTER — Other Ambulatory Visit: Payer: Self-pay | Admitting: Urology

## 2020-08-08 ENCOUNTER — Other Ambulatory Visit: Payer: Self-pay | Admitting: Family Medicine

## 2020-08-08 DIAGNOSIS — C61 Malignant neoplasm of prostate: Secondary | ICD-10-CM

## 2020-09-24 DIAGNOSIS — H401132 Primary open-angle glaucoma, bilateral, moderate stage: Secondary | ICD-10-CM | POA: Diagnosis not present

## 2020-10-08 ENCOUNTER — Ambulatory Visit (INDEPENDENT_AMBULATORY_CARE_PROVIDER_SITE_OTHER): Payer: Medicare Other | Admitting: Family Medicine

## 2020-10-08 ENCOUNTER — Other Ambulatory Visit: Payer: Self-pay

## 2020-10-08 VITALS — BP 128/68 | HR 63 | Temp 97.8°F | Ht 71.0 in | Wt 196.3 lb

## 2020-10-08 DIAGNOSIS — K5909 Other constipation: Secondary | ICD-10-CM

## 2020-10-08 DIAGNOSIS — E7800 Pure hypercholesterolemia, unspecified: Secondary | ICD-10-CM

## 2020-10-08 DIAGNOSIS — J449 Chronic obstructive pulmonary disease, unspecified: Secondary | ICD-10-CM

## 2020-10-08 DIAGNOSIS — E119 Type 2 diabetes mellitus without complications: Secondary | ICD-10-CM | POA: Diagnosis not present

## 2020-10-08 LAB — POCT GLYCOSYLATED HEMOGLOBIN (HGB A1C): Hemoglobin A1C: 7.8 % — AB (ref 4.0–5.6)

## 2020-10-08 LAB — LIPID PANEL
Cholesterol: 130 mg/dL (ref 0–200)
HDL: 42.2 mg/dL (ref >39.00)
LDL Cholesterol: 67 mg/dL (ref 0–99)
NonHDL: 87.6
Total CHOL/HDL Ratio: 3
Triglycerides: 105 mg/dL (ref 0.0–149.0)
VLDL: 21 mg/dL (ref 0.0–40.0)

## 2020-10-08 MED ORDER — OPTUMRX BLOOD GLUCOSE METER W/DEVICE KIT: PACK | 1 refills | Status: DC

## 2020-10-08 NOTE — Assessment & Plan Note (Signed)
Advised trial of colace and fiber supplement as well as increasing hydration as pt has not been drinking as much water. Hand out provided

## 2020-10-08 NOTE — Progress Notes (Signed)
Subjective:     Alexander Dixon is a 64 y.o. male presenting for Follow-up (3 mo- DM )     HPI  #Diabetes Currently taking metformin (Glucophage, Riomet)  Using medications without difficulties: Yes Hypoglycemic episodes:has not been checking regularly Hyperglycemic episodes:has not been checking Feet problems:No  Blood Sugars averaging: unknown Last HgbA1c:  Lab Results  Component Value Date   HGBA1C 7.8 (A) 10/08/2020    Diabetes Health Maintenance Due:    Diabetes Health Maintenance Due  Topic Date Due  . OPHTHALMOLOGY EXAM  03/10/2021  . FOOT EXAM  04/03/2021  . URINE MICROALBUMIN  04/03/2021  . HEMOGLOBIN A1C  04/07/2021   Diet - eating too many tomatoes, still eating some candy   #constipation - will take medication every few days due to being constipated - linzess worked but too expensive - has not tried anything else  Review of Systems   Social History   Tobacco Use  Smoking Status Former Smoker  . Packs/day: 1.00  . Years: 30.00  . Pack years: 30.00  . Types: Cigarettes  . Quit date: 12/2000  . Years since quitting: 19.7  Smokeless Tobacco Never Used        Objective:    BP Readings from Last 3 Encounters:  10/08/20 128/68  07/08/20 130/72  04/21/20 (!) 169/83   Wt Readings from Last 3 Encounters:  10/08/20 196 lb 5 oz (89 kg)  07/08/20 193 lb (87.5 kg)  04/21/20 190 lb (86.2 kg)    BP 128/68   Pulse 63   Temp 97.8 F (36.6 C) (Oral)   Ht _0  (1.803 m)   Wt 196 lb 5 oz (89 kg)   SpO2 98%   BMI 27.38 kg/m    Physical Exam Constitutional:      Appearance: Normal appearance. He is not ill-appearing or diaphoretic.  HENT:     Right Ear: External ear normal.     Left Ear: External ear normal.     Nose: Nose normal.  Eyes:     General: No scleral icterus.    Extraocular Movements: Extraocular movements intact.     Conjunctiva/sclera: Conjunctivae normal.  Cardiovascular:     Rate and Rhythm: Normal rate and  regular rhythm.     Heart sounds: No murmur heard.   Pulmonary:     Effort: Pulmonary effort is normal. No respiratory distress.     Breath sounds: Normal breath sounds. No wheezing.  Musculoskeletal:     Cervical back: Neck supple.  Skin:    General: Skin is warm and dry.  Neurological:     Mental Status: He is alert. Mental status is at baseline.  Psychiatric:        Mood and Affect: Mood normal.           Assessment & Plan:   Problem List Items Addressed This Visit      Respiratory   COPD, mild ([place])    Breathing well. No need for inhaler currently        Digestive   Chronic constipation    Advised trial of colace and fiber supplement as well as increasing hydration as pt has not been drinking as much water. Hand out provided        Endocrine   Type 2 diabetes mellitus without complication, without long-term current use of insulin ([place]) - Primary    Poorly controlled. He does not want to try another medication. Given list of options. Nutrition referral made. Encouraged medication  while working on diet - he will consider. Cont metformin 1000 mg BID. Advised 2nd agent. With PVD/CAD recommended GLP-1 or SGLT-2.       Relevant Medications   Blood Glucose Monitoring Suppl ([facility] BLOOD GLUCOSE METER) w/Device KIT   Other Relevant Orders   POCT glycosylated hemoglobin (Hb A1C) (Completed)   Ambulatory referral to diabetic education     Other   Pure hypercholesterolemia    Continue lipitor 10 mg. Not fasting today, but will recheck labs for goal LDL<70.       Relevant Orders   Lipid panel       Return in about 3 months (around 01/08/2021).  Lesleigh Noe, MD  This visit occurred during the SARS-CoV-2 public health emergency.  Safety protocols were in place, including screening questions prior to the visit, additional usage of staff PPE, and extensive cleaning of exam room while observing appropriate contact time as indicated for disinfecting solutions.

## 2020-10-08 NOTE — Assessment & Plan Note (Signed)
Continue lipitor 10 mg. Not fasting today, but will recheck labs for goal LDL<70.

## 2020-10-08 NOTE — Assessment & Plan Note (Signed)
Breathing well. No need for inhaler currently

## 2020-10-08 NOTE — Patient Instructions (Addendum)
Choice of medication - Jardiance (heart healthy) - Trulicity (heart healthy - weekly injection) - Januvia - pill but not as much heart benefit  If those are too expensive  Can do Glipizide   Please send mychart or call within 1 week to let me know what you want to start.    Constipation   Constipation is a common issue. Often it is related to diet and occasionally medications.   What you can do to treat your symptoms 1) Fiber -- Eat more fiber rich foods: beans, broccoli, berries, avocados, popcorn, pear/apple, green peas, turnip greens, brussels sprouts, whole grains (barley, bran, quinoa, oatmeal) -- Take a Fiber supplement: Psyllium (Metamucil)    2) Hydration  -- Drink more water: Try to drink 64 oz of water per day  3) Exercise -- Moderate exercise (walking, jogging, biking) for 30 minutes, 5 days a week  4) Dedicate time for Bowel movements - do not delay  5) Stool Softener  - Docusate Sodium (Colace) 100 mg daily or twice daily as needed   If 4-6 weeks have passed and the above has not helped then start the following 6) Laxatives -- Polyethylene Glycol (Miralax) - begin with once daily. After a few days can increase to twice daily Or -- Magnesium Citrate -- Common side effect is nausea and diarrhea -- can try if still not improved    Treating chronic constipation is often about finding the right amount of medication and fiber to keep you regular and comfortable. For some people that may be daily metamucil and colace every other day. For others it may be Metamucil and colace twice daily and Miralax 3 times a week. The goal is to go slow and listen to your body. And normal can be anywhere from 2-3 soft bowel movements a day to 1 bowel movement every 2-3 days.

## 2020-10-08 NOTE — Assessment & Plan Note (Signed)
Poorly controlled. He does not want to try another medication. Given list of options. Nutrition referral made. Encouraged medication while working on diet - he will consider. Cont metformin 1000 mg BID. Advised 2nd agent. With PVD/CAD recommended GLP-1 or SGLT-2.

## 2020-10-14 ENCOUNTER — Other Ambulatory Visit: Payer: Self-pay | Admitting: *Deleted

## 2020-10-14 DIAGNOSIS — E119 Type 2 diabetes mellitus without complications: Secondary | ICD-10-CM

## 2020-10-14 MED ORDER — ONETOUCH ULTRA VI STRP: ORAL_STRIP | 3 refills | Status: DC

## 2020-10-16 ENCOUNTER — Telehealth: Payer: Self-pay | Admitting: Family Medicine

## 2020-10-16 NOTE — Telephone Encounter (Signed)
Pt called needing help getting his one touch ultra 2 meter set up  Please advise

## 2020-10-17 NOTE — Telephone Encounter (Signed)
Called and spoke with patient about setting up his glucose meter. Patient stated that he just opened up the kit this morning and they left his lancets out. Patient stated that he would call the pharmacy and then call back to schedule a nurse visit appointment to go over teaching.

## 2020-10-20 ENCOUNTER — Ambulatory Visit: Payer: Medicare Other | Admitting: Dermatology

## 2020-10-20 ENCOUNTER — Encounter: Payer: Self-pay | Admitting: Dermatology

## 2020-10-20 ENCOUNTER — Other Ambulatory Visit: Payer: Self-pay

## 2020-10-20 DIAGNOSIS — D485 Neoplasm of uncertain behavior of skin: Secondary | ICD-10-CM

## 2020-10-20 MED ORDER — ONETOUCH DELICA PLUS LANCET30G MISC
3 refills | Status: DC
Start: 2020-10-20 — End: 2021-02-20

## 2020-10-20 NOTE — Progress Notes (Signed)
   Follow-Up Visit   Subjective  Alexander Dixon is a 64 y.o. male who presents for the following: Other (Spot on right chin that weeps. Has been there for ~3 years. Was on amoxicillin for the same area a few years ago.).  Referral from Dr. Waunita Schooner  The following portions of the chart were reviewed this encounter and updated as appropriate:  Tobacco  Allergies  Meds  Problems  Med Hx  Surg Hx  Fam Hx     Review of Systems:  No other skin or systemic complaints except as noted in HPI or Assessment and Plan.  Objective  Well appearing patient in no apparent distress; mood and affect are within normal limits.  A focused examination was performed including face. Relevant physical exam findings are noted in the Assessment and Plan.  Objective  Right Anterior Mandible: Peanut sized papule of right mandible. Clear milky liquid drainage from sup to area when palpated.   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  It has a milky watery discharge. Right Anterior Mandible Because this does not appear to be nor behave like an epidermoid cyst and appears to  tract deeper like a Branchial Cleft Cyst / Sinus, I advised the patient that he would be better served by being evaluated by an ENT surgeon for possible excision, rather than a dermatologist.  We will refer to ENT.  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 Recommend evaluation with ENT.  Return if symptoms worsen or fail to improve.   I, Ashok Cordia, CMA, am acting as scribe for Sarina Ser, MD .  Documentation: I have reviewed the above documentation for accuracy and completeness, and I agree with the above.  Sarina Ser, MD

## 2020-10-20 NOTE — Addendum Note (Signed)
Addended by: Carter Kitten on: 10/20/2020 04:44 PM   Modules accepted: Orders

## 2020-10-21 ENCOUNTER — Ambulatory Visit: Payer: Medicare Other

## 2020-10-21 DIAGNOSIS — E119 Type 2 diabetes mellitus without complications: Secondary | ICD-10-CM

## 2020-10-21 NOTE — Progress Notes (Signed)
Patient brought in his glucose meter supplies. This RN helped patient set up his new meter and discussed with him the steps on how to properly check his blood sugar. Patient demonstrated proper technique using the teach back method. Discussed with patient on when to check his blood sugar and what the normal range is for the blood sugar. Patient verbalized understanding.

## 2020-10-27 ENCOUNTER — Encounter: Payer: Self-pay | Admitting: *Deleted

## 2020-10-27 ENCOUNTER — Encounter: Payer: Medicare Other | Attending: Family Medicine | Admitting: *Deleted

## 2020-10-27 ENCOUNTER — Other Ambulatory Visit: Payer: Self-pay

## 2020-10-27 VITALS — BP 132/70 | Ht 72.0 in | Wt 198.5 lb

## 2020-10-27 DIAGNOSIS — E1165 Type 2 diabetes mellitus with hyperglycemia: Secondary | ICD-10-CM

## 2020-10-27 DIAGNOSIS — E119 Type 2 diabetes mellitus without complications: Secondary | ICD-10-CM | POA: Diagnosis not present

## 2020-10-27 NOTE — Patient Instructions (Signed)
Check blood sugars 1 x day before breakfast or 2 hrs after one meal every day Bring blood sugar records to the next class  Exercise: Continue program for  60-120  minutes 5 days a week  Eat 3 meals day,  1-2  snacks a day Space meals 4-6 hours apart  Return for classes on:

## 2020-10-28 NOTE — Progress Notes (Signed)
Diabetes Self-Management Education  Visit Type: First/Initial  Appt. Start Time: 1340 Appt. End Time: 0973  10/27/2020  [name], identified by name and date of birth, is a 64 y.o. male with a diagnosis of Diabetes: Type 2.   ASSESSMENT  Blood pressure 132/70, height 6' (1.829 m), weight 198 lb 8 oz (90 kg). Body mass index is 26.92 kg/m.   Diabetes Self-Management Education - 10/27/20 1525      Visit Information   Visit Type First/Initial      Initial Visit   Diabetes Type Type 2    Are you currently following a meal plan? No   "just common sense"   Are you taking your medications as prescribed? Yes    Date Diagnosed 7 years      Health Coping   How would you rate your overall health? Excellent;Good      Psychosocial Assessment   Patient Belief/Attitude about Diabetes Motivated to manage diabetes   "tired if I'm not moving around"   Self-care barriers None    Self-management support Doctor's office;Family    Patient Concerns Nutrition/Meal planning;Glycemic Control;Monitoring;Healthy Lifestyle    Special Needs None    Preferred Learning Style Visual;Hands on    Learning Readiness Change in progress    How often do you need to have someone help you when you read instructions, pamphlets, or other written materials from your doctor or pharmacy? 1 - Never    What is the last grade level you completed in school? college 2 years      Pre-Education Assessment   Patient understands the diabetes disease and treatment process. Needs Review    Patient understands incorporating nutritional management into lifestyle. Needs Instruction    Patient undertands incorporating physical activity into lifestyle. Demonstrates understanding / competency    Patient understands using medications safely. Needs Review    Patient understands monitoring blood glucose, interpreting and using results Needs Review    Patient understands prevention, detection, and treatment of acute  complications. Needs Instruction    Patient understands prevention, detection, and treatment of chronic complications. Needs Review    Patient understands how to develop strategies to address psychosocial issues. Needs Instruction    Patient understands how to develop strategies to promote health/change behavior. Needs Instruction      Complications   Last HgB A1C per patient/outside source 7.8 %   10/08/2020   How often do you check your blood sugar? 1-2 times/day    Fasting Blood glucose range (mg/dL) 130-179   Pt reports FBG's 130-147 mg/dL.   Have you had a dilated eye exam in the past 12 months? Yes    Have you had a dental exam in the past 12 months? Yes    Are you checking your feet? Yes    How many days per week are you checking your feet? 7      Dietary Intake   Breakfast oatmeal and seeds (chia, pumpkin); 1/4 muffin    Snack (morning) 2 snacks/day - chips and dip, apple, nuts    Lunch [employer] or [employer] for vegetable plate - green beans, turnip greens, lima beans, cornbread, mac-n-cheese; [employer] 5 finger chicken strips and fries    Dinner chicken, flank steak, protein shake; spaghetti, chili beans, corn, peas, broccoli, salads with lettuce, tomatoes, carrots    Beverage(s) water, unsweetened tea and coffee      Exercise   Exercise Type Moderate (swimming / aerobic walking)   gym for cardio and weights  How many days per week to you exercise? 5    How many minutes per day do you exercise? 90    Total minutes per week of exercise 450      Patient Education   Previous Diabetes Education No    Disease state  Definition of diabetes, type 1 and 2, and the diagnosis of diabetes;Factors that contribute to the development of diabetes    Nutrition management  Role of diet in the treatment of diabetes and the relationship between the three main macronutrients and blood glucose level;Food label reading, portion sizes and measuring food.;Reviewed blood glucose goals for pre and post  meals and how to evaluate the patients' food intake on their blood glucose level.    Physical activity and exercise  Role of exercise on diabetes management, blood pressure control and cardiac health.    Medications Reviewed patients medication for diabetes, action, purpose, timing of dose and side effects.    Monitoring Purpose and frequency of SMBG.;Taught/discussed recording of test results and interpretation of SMBG.;Identified appropriate SMBG and/or A1C goals.    Chronic complications Relationship between chronic complications and blood glucose control    Psychosocial adjustment Identified and addressed patients feelings and concerns about diabetes      Individualized Goals (developed by patient)   Reducing Risk Other (comment)   improve blood sugars, prevent diabetes complications, lead a healthier lifestyle, become more fit     Outcomes   Expected Outcomes Demonstrated interest in learning. Expect positive outcomes           Individualized Plan for Diabetes Self-Management Training:   Learning Objective:  Patient will have a greater understanding of diabetes self-management. Patient education plan is to attend individual and/or group sessions per assessed needs and concerns.   Plan:   Patient Instructions  Check blood sugars 1 x day before breakfast or 2 hrs after one meal every day Bring blood sugar records to the next class  Exercise: Continue program for  60-120  minutes 5 days a week  Eat 3 meals day,  1-2  snacks a day Space meals 4-6 hours apart  Expected Outcomes:  Demonstrated interest in learning. Expect positive outcomes  Education material provided:  General Meal Planning Guidelines Simple Meal Plan  If problems or questions, patient to contact team via:  Johny Drilling, RN, [place] [phone]  Future DSME appointment:  October 30, 2020 for Diabetes Class 1

## 2020-10-30 ENCOUNTER — Other Ambulatory Visit: Payer: Self-pay

## 2020-10-30 ENCOUNTER — Encounter: Payer: Medicare Other | Attending: Family Medicine | Admitting: Dietician

## 2020-10-30 ENCOUNTER — Encounter: Payer: Self-pay | Admitting: Dietician

## 2020-10-30 VITALS — Ht 72.0 in | Wt 194.5 lb

## 2020-10-30 DIAGNOSIS — E119 Type 2 diabetes mellitus without complications: Secondary | ICD-10-CM | POA: Insufficient documentation

## 2020-10-30 DIAGNOSIS — E1165 Type 2 diabetes mellitus with hyperglycemia: Secondary | ICD-10-CM | POA: Insufficient documentation

## 2020-10-30 NOTE — Progress Notes (Signed)
Appt. Start Time: 900 Appt. End Time: 1200  Class 1 Diabetes Overview - define DM; state own type of DM; identify functions of pancreas and insulin; define insulin deficiency vs insulin resistance  Psychosocial - identify DM as a source of stress; state the effects of stress on BG control  Nutritional Management - describe effects of food on blood glucose; identify sources of carbohydrate, protein and fat; verbalize the importance of balance meals in controlling blood glucose  Exercise - describe the effects of exercise on blood glucose and importance of regular exercise in controlling diabetes; state a plan for personal exercise; verbalize contraindications for exercise  Self-Monitoring - state importance of SMBG; use SMBG results to effectively manage diabetes; identify importance of regular HbA1C testing and goals for results  Acute Complications - recognize hyperglycemia and hypoglycemia with causes and effects; identify blood glucose results as high, low or in control; list steps in treating and preventing high and low blood glucose  Chronic Complications/Foot, Skin, Eye Dental Care - identify possible long-term complications of diabetes (retinopathy, neuropathy, nephropathy, cardiovascular disease, infections); state importance of daily self-foot exams; describe how to examine feet and what to look for; explain appropriate eye and dental care  Lifestyle Changes/Goals & Health/Community Resources - state benefits of making appropriate lifestyle changes; identify habits that need to change (meals, tobacco, alcohol); identify strategies to reduce risk factors for personal health  Pregnancy/Sexual Health - define gestational diabetes; state importance of good blood glucose control and birth control prior to pregnancy; state importance of good blood glucose control in preventing sexual problems (impotence, vaginal dryness, infections, loss of desire); state relationship of blood glucose control  and pregnancy outcome; describe risk of maternal and fetal complications  Teaching Materials Used: Class 1 Slides/Notebook Diabetes Booklet ID Card  Medic Alert/Medic ID Forms Sleep Evaluation Exercise Handout Planning a Balanced Meal Goals for Class 1

## 2020-11-06 ENCOUNTER — Encounter: Payer: Medicare Other | Admitting: *Deleted

## 2020-11-06 ENCOUNTER — Encounter: Payer: Self-pay | Admitting: *Deleted

## 2020-11-06 ENCOUNTER — Other Ambulatory Visit: Payer: Self-pay

## 2020-11-06 VITALS — Wt 199.0 lb

## 2020-11-06 DIAGNOSIS — E119 Type 2 diabetes mellitus without complications: Secondary | ICD-10-CM

## 2020-11-06 DIAGNOSIS — E1165 Type 2 diabetes mellitus with hyperglycemia: Secondary | ICD-10-CM | POA: Diagnosis not present

## 2020-11-06 NOTE — Progress Notes (Signed)

## 2020-11-10 DIAGNOSIS — L723 Sebaceous cyst: Secondary | ICD-10-CM | POA: Diagnosis not present

## 2020-11-13 ENCOUNTER — Encounter: Payer: Self-pay | Admitting: Dietician

## 2020-11-13 ENCOUNTER — Other Ambulatory Visit: Payer: Self-pay

## 2020-11-13 ENCOUNTER — Encounter: Payer: Medicare Other | Admitting: Dietician

## 2020-11-13 VITALS — BP 132/78 | Ht 72.0 in | Wt 192.7 lb

## 2020-11-13 DIAGNOSIS — E119 Type 2 diabetes mellitus without complications: Secondary | ICD-10-CM

## 2020-11-13 DIAGNOSIS — E1165 Type 2 diabetes mellitus with hyperglycemia: Secondary | ICD-10-CM | POA: Diagnosis not present

## 2020-11-13 NOTE — Progress Notes (Signed)
Appt. Start Time: 900 Appt. End Time: 1200  Class 3 Diabetes Overview - identify functions of pancreas and insulin; define insulin deficiency vs insulin resistance  Medications - state name, dose, timing of currently prescribed medications; describe types of medications available for diabetes  Psychosocial - identify DM as a source of stress; state the effects of stress on BG control; verbalize appropriate stress management techniques; identify personal stress issues   Nutritional Management - use food labels to identify serving size, content of carbohydrate, fiber, protein, fat, saturated fat and sodium; recognize food sources of fat, saturated fat, trans fat, and sodium, and verbalize goals for intake; describe healthful, appropriate food choices when dining out   Exercise - state a plan for personal exercise; verbalize contraindications for exercise  Self-Monitoring - state importance of SMBG; use SMBG results to effectively manage diabetes; identify importance of regular HbA1C testing and goals for results  Acute Complications - recognize hyperglycemia and hypoglycemia with causes and effects; identify blood glucose results as high, low or in control; list steps in treating and preventing high and low blood glucose  Chronic Complications - state importance of daily self-foot exams; explain appropriate eye and dental care  Lifestyle Changes/Goals & Health/Community Resources - set goals for proper diabetes care; state need for and frequency of healthcare follow-up; describe appropriate community resources for good health (ADA, web sites, apps)   Teaching Materials Used: Class 3 Slide Packet Diabetes Stress Test Stress Management Tools Stress Poem Goal Setting Worksheet Website/App List

## 2020-11-14 ENCOUNTER — Encounter: Payer: Self-pay | Admitting: *Deleted

## 2020-11-20 ENCOUNTER — Other Ambulatory Visit: Payer: Self-pay | Admitting: Otolaryngology

## 2020-11-20 ENCOUNTER — Other Ambulatory Visit: Payer: Self-pay | Admitting: Family Medicine

## 2020-11-20 DIAGNOSIS — L723 Sebaceous cyst: Secondary | ICD-10-CM

## 2020-12-08 ENCOUNTER — Ambulatory Visit
Admission: RE | Admit: 2020-12-08 | Discharge: 2020-12-08 | Disposition: A | Discharge status: Home / Self Care | Payer: Medicare Other | Source: Ambulatory Visit | Attending: Otolaryngology | Admitting: Otolaryngology

## 2020-12-08 ENCOUNTER — Other Ambulatory Visit: Payer: Self-pay | Admitting: Otolaryngology

## 2020-12-08 ENCOUNTER — Other Ambulatory Visit: Payer: Self-pay

## 2020-12-08 DIAGNOSIS — L723 Sebaceous cyst: Secondary | ICD-10-CM | POA: Diagnosis not present

## 2020-12-08 LAB — POCT I-STAT CREATININE: Creatinine, Ser: 0.9 mg/dL (ref 0.61–1.24)

## 2020-12-08 MED ORDER — IOHEXOL 300 MG/ML  SOLN
75.0000 mL | Freq: Once | INTRAMUSCULAR | Status: AC | PRN
  Administered 2020-12-08: 75 mL via INTRAVENOUS

## 2020-12-11 ENCOUNTER — Telehealth: Payer: Self-pay

## 2020-12-11 DIAGNOSIS — E119 Type 2 diabetes mellitus without complications: Secondary | ICD-10-CM

## 2020-12-11 MED ORDER — GLIPIZIDE 5 MG PO TABS: 5.0000 mg | ORAL_TABLET | Freq: Every day | ORAL | 3 refills | Status: DC

## 2020-12-11 NOTE — Telephone Encounter (Signed)
Medication sent to pharmacy  Lab Results  Component Value Date   HGBA1C 7.8 (A) 10/08/2020   Would recommend visit in 3 months for follow-up
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
## 2020-12-12 NOTE — Telephone Encounter (Signed)
Called patient and he stated he will keep visit for 01/08/21 instead of scheduling visit for April.

## 2020-12-31 DIAGNOSIS — L723 Sebaceous cyst: Secondary | ICD-10-CM | POA: Diagnosis not present

## 2021-01-02 ENCOUNTER — Encounter: Payer: Self-pay | Admitting: Otolaryngology

## 2021-01-02 ENCOUNTER — Other Ambulatory Visit: Payer: Self-pay

## 2021-01-05 ENCOUNTER — Other Ambulatory Visit: Payer: Self-pay

## 2021-01-05 ENCOUNTER — Other Ambulatory Visit
Admission: RE | Admit: 2021-01-05 | Discharge: 2021-01-05 | Disposition: A | Discharge status: Home / Self Care | Payer: Medicare Other | Source: Ambulatory Visit | Attending: Otolaryngology | Admitting: Otolaryngology

## 2021-01-05 DIAGNOSIS — Z20822 Contact with and (suspected) exposure to covid-19: Secondary | ICD-10-CM | POA: Insufficient documentation

## 2021-01-05 DIAGNOSIS — Z01812 Encounter for preprocedural laboratory examination: Secondary | ICD-10-CM | POA: Diagnosis not present

## 2021-01-05 LAB — SARS CORONAVIRUS 2 (TAT 6-24 HRS): SARS Coronavirus 2: NEGATIVE

## 2021-01-06 NOTE — Discharge Instructions (Signed)

## 2021-01-07 ENCOUNTER — Ambulatory Visit
Admission: RE | Admit: 2021-01-07 | Discharge: 2021-01-07 | Disposition: A | Discharge status: Home / Self Care | Payer: Medicare Other | Attending: Otolaryngology | Admitting: Otolaryngology

## 2021-01-07 ENCOUNTER — Ambulatory Visit: Payer: Medicare Other | Admitting: Anesthesiology

## 2021-01-07 ENCOUNTER — Encounter
Admission: RE | Disposition: A | Discharge status: Home / Self Care | Payer: Self-pay | Source: Home / Self Care | Attending: Otolaryngology

## 2021-01-07 ENCOUNTER — Encounter: Payer: Self-pay | Admitting: Otolaryngology

## 2021-01-07 ENCOUNTER — Other Ambulatory Visit: Payer: Self-pay

## 2021-01-07 DIAGNOSIS — L720 Epidermal cyst: Secondary | ICD-10-CM | POA: Diagnosis not present

## 2021-01-07 HISTORY — PX: EAR CYST EXCISION: SHX22

## 2021-01-07 HISTORY — PX: CONTINUOUS NERVE MONITORING: SHX6650

## 2021-01-07 LAB — GLUCOSE, CAPILLARY: Glucose-Capillary: 121 mg/dL — ABNORMAL HIGH (ref 70–99)

## 2021-01-07 SURGERY — EXCISION, CYST, EAR
Anesthesia: General | Site: Face | Laterality: Right

## 2021-01-07 MED ORDER — FENTANYL CITRATE (PF) 100 MCG/2ML IJ SOLN: 25.0000 ug | INTRAMUSCULAR | Status: DC | PRN

## 2021-01-07 MED ORDER — GLYCOPYRROLATE 0.2 MG/ML IJ SOLN
INTRAMUSCULAR | Status: DC | PRN
  Administered 2021-01-07: .1 mg via INTRAVENOUS

## 2021-01-07 MED ORDER — KETOROLAC TROMETHAMINE 30 MG/ML IJ SOLN: 15.0000 mg | Freq: Once | INTRAMUSCULAR | Status: DC | PRN

## 2021-01-07 MED ORDER — OXYCODONE HCL 5 MG PO TABS: 5.0000 mg | ORAL_TABLET | Freq: Once | ORAL | Status: DC | PRN

## 2021-01-07 MED ORDER — ONDANSETRON HCL 4 MG/2ML IJ SOLN: 4.0000 mg | Freq: Once | INTRAMUSCULAR | Status: DC | PRN

## 2021-01-07 MED ORDER — LIDOCAINE HCL (CARDIAC) PF 100 MG/5ML IV SOSY
PREFILLED_SYRINGE | INTRAVENOUS | Status: DC | PRN
  Administered 2021-01-07: 50 mg via INTRATRACHEAL

## 2021-01-07 MED ORDER — DEXAMETHASONE SODIUM PHOSPHATE 4 MG/ML IJ SOLN
INTRAMUSCULAR | Status: DC | PRN
  Administered 2021-01-07: 10 mg via INTRAVENOUS

## 2021-01-07 MED ORDER — 0.9 % SODIUM CHLORIDE (POUR BTL) OPTIME
TOPICAL | Status: DC | PRN
  Administered 2021-01-07: 1000 mL

## 2021-01-07 MED ORDER — FENTANYL CITRATE (PF) 100 MCG/2ML IJ SOLN
INTRAMUSCULAR | Status: DC | PRN
  Administered 2021-01-07: 25 ug via INTRAVENOUS
  Administered 2021-01-07: 12.5 ug via INTRAVENOUS

## 2021-01-07 MED ORDER — MIDAZOLAM HCL 5 MG/5ML IJ SOLN
INTRAMUSCULAR | Status: DC | PRN
  Administered 2021-01-07: 2 mg via INTRAVENOUS

## 2021-01-07 MED ORDER — ONDANSETRON HCL 4 MG/2ML IJ SOLN
INTRAMUSCULAR | Status: DC | PRN
  Administered 2021-01-07: 4 mg via INTRAVENOUS

## 2021-01-07 MED ORDER — LIDOCAINE-EPINEPHRINE 1 %-1:100000 IJ SOLN
INTRAMUSCULAR | Status: DC | PRN
  Administered 2021-01-07: 1 mL

## 2021-01-07 MED ORDER — PROPOFOL 10 MG/ML IV BOLUS
INTRAVENOUS | Status: DC | PRN
  Administered 2021-01-07: 200 mg via INTRAVENOUS

## 2021-01-07 MED ORDER — OXYCODONE HCL 5 MG/5ML PO SOLN: 5.0000 mg | Freq: Once | ORAL | Status: DC | PRN

## 2021-01-07 MED ORDER — LACTATED RINGERS IV SOLN: INTRAVENOUS | Status: DC

## 2021-01-07 SURGICAL SUPPLY — 20 items
ADH SKN CLS APL DERMABOND .7 (GAUZE/BANDAGES/DRESSINGS) ×2
APPLICATOR COTTON TIP WD 3 STR (MISCELLANEOUS) ×1 IMPLANT
CANISTER SUCT 1200ML W/VALVE (MISCELLANEOUS) ×1 IMPLANT
DERMABOND ADVANCED (GAUZE/BANDAGES/DRESSINGS) ×1
DERMABOND ADVANCED .7 DNX12 (GAUZE/BANDAGES/DRESSINGS) IMPLANT
DRSG TEGADERM 2-3/8X2-3/4 SM (GAUZE/BANDAGES/DRESSINGS) ×1 IMPLANT
ELECT NEEDLE 20X.3 GREEN (MISCELLANEOUS) ×3
ELECTRODE NDL 20X.3 GREEN (MISCELLANEOUS) IMPLANT
ELECTRODE NEEDLE 20X.3 GREEN (MISCELLANEOUS) ×2 IMPLANT
GLOVE BIO SURGEON STRL SZ7.5 (GLOVE) ×2 IMPLANT
GOWN STRL REUS W/ TWL LRG LVL3 (GOWN DISPOSABLE) IMPLANT
GOWN STRL REUS W/TWL LRG LVL3 (GOWN DISPOSABLE) ×3
KIT TURNOVER KIT A (KITS) ×1 IMPLANT
NS IRRIG 500ML POUR BTL (IV SOLUTION) ×1 IMPLANT
PACK ENT CUSTOM (PACKS) ×1 IMPLANT
SOL PREP PROV IODINE SCRUB 4OZ (MISCELLANEOUS) ×1 IMPLANT
SUT PROLENE 6 0 P 1 18 (SUTURE) ×1 IMPLANT
SUT VIC AB 5-0 P-3 18X BRD (SUTURE) IMPLANT
SUT VIC AB 5-0 P3 18 (SUTURE) ×3
TOWEL OR 17X26 4PK STRL BLUE (TOWEL DISPOSABLE) ×1 IMPLANT

## 2021-01-07 NOTE — Transfer of Care (Signed)
Immediate Anesthesia Transfer of Care Note  Patient: Alexander Dixon  Procedure(s) Performed: EXCISION EPIDERMAL INCLUSION CYST (Right Face) FACIAL NERVE MONITORING (N/A Face)  Patient Location: PACU  Anesthesia Type: General  Level of Consciousness: awake, alert  and patient cooperative  Airway and Oxygen Therapy: Patient Spontanous Breathing and Patient connected to supplemental oxygen  Post-op Assessment: Post-op Vital signs reviewed, Patient's Cardiovascular Status Stable, Respiratory Function Stable, Patent Airway and No signs of Nausea or vomiting  Post-op Vital Signs: Reviewed and stable  Complications: No complications documented.

## 2021-01-07 NOTE — Anesthesia Postprocedure Evaluation (Signed)
Anesthesia Post Note  Patient: Alexander Dixon  Procedure(s) Performed: EXCISION EPIDERMAL INCLUSION CYST (Right Face) FACIAL NERVE MONITORING (N/A Face)     Patient location during evaluation: PACU Anesthesia Type: General Level of consciousness: awake and alert Pain management: pain level controlled Vital Signs Assessment: post-procedure vital signs reviewed and stable Respiratory status: spontaneous breathing, nonlabored ventilation, respiratory function stable and patient connected to nasal cannula oxygen Cardiovascular status: blood pressure returned to baseline and stable Postop Assessment: no apparent nausea or vomiting Anesthetic complications: no   No complications documented.  Sinda Du

## 2021-01-07 NOTE — Anesthesia Procedure Notes (Signed)
Procedure Name: LMA Insertion Date/Time: 01/07/2021 8:21 AM Performed by: Mayme Genta, CRNA Pre-anesthesia Checklist: Patient identified, Emergency Drugs available, Suction available, Timeout performed and Patient being monitored Patient Re-evaluated:Patient Re-evaluated prior to induction Oxygen Delivery Method: Circle system utilized Preoxygenation: Pre-oxygenation with 100% oxygen Induction Type: IV induction LMA: LMA inserted LMA Size: 4.0 Number of attempts: 1 Placement Confirmation: positive ETCO2 and breath sounds checked- equal and bilateral Tube secured with: Tape

## 2021-01-07 NOTE — H&P (Signed)
..  History and Physical paper copy reviewed and updated date of procedure and will be scanned into system.  Patient seen and examined and marked.  Discussed risks of surgery including injury to marginal mandibular nerve and facial scar.

## 2021-01-07 NOTE — Anesthesia Preprocedure Evaluation (Addendum)
Anesthesia Evaluation  Patient identified by MRN, date of birth, ID band Patient awake    Reviewed: Allergy & Precautions, NPO status , Patient's Chart, lab work & pertinent test results  History of Anesthesia Complications Negative for: history of anesthetic complications  Airway Mallampati: II  TM Distance: >3 FB Neck ROM: Full    Dental no notable dental hx.    Pulmonary neg sleep apnea, COPD,  COPD inhaler, former smoker,    Pulmonary exam normal breath sounds clear to auscultation- rhonchi (-) wheezing      Cardiovascular Exercise Tolerance: Good (-) hypertension(-) CAD, (-) Past MI, (-) Cardiac Stents and (-) CABG Normal cardiovascular exam Rhythm:Regular Rate:Normal - Systolic murmurs and - Diastolic murmurs    Neuro/Psych neg Seizures negative neurological ROS  negative psych ROS   GI/Hepatic negative GI ROS, Neg liver ROS,   Endo/Other  diabetes, Oral Hypoglycemic Agents  Renal/GU negative Renal ROS     Musculoskeletal negative musculoskeletal ROS (+)   Abdominal Normal abdominal exam  (+) - obese,   Peds  Hematology negative hematology ROS (+)   Anesthesia Other Findings Past Medical History: No date: Diabetes mellitus without complication (HCC) No date: History of chicken pox No date: Prostate cancer (HCC)   Reproductive/Obstetrics                             Anesthesia Physical  Anesthesia Plan  ASA: III  Anesthesia Plan: General   Post-op Pain Management:    Induction: Intravenous  PONV Risk Score and Plan: 1 and Propofol infusion  Airway Management Planned: LMA  Additional Equipment:   Intra-op Plan:   Post-operative Plan:   Informed Consent: I have reviewed the patients History and Physical, chart, labs and discussed the procedure including the risks, benefits and alternatives for the proposed anesthesia with the patient or authorized representative who  has indicated his/her understanding and acceptance.     Dental advisory given  Plan Discussed with: CRNA and Surgeon  Anesthesia Plan Comments: (Avoid long acting paralytic for facial nerve monitoring)       Anesthesia Quick Evaluation  Patient Active Problem List   Diagnosis Date Noted  . Chronic constipation 10/08/2020  . Skin mass 07/08/2020  . Mild atherosclerosis of carotid artery, right 05/07/2020  . Elevated BP without diagnosis of hypertension 04/03/2020  . Nonrheumatic mitral (valve) insufficiency 11/21/2019  . Cerebrovascular disease 11/21/2019  . Pure hypercholesterolemia 11/21/2019  . Erectile dysfunction 11/21/2019  . Coronary arteriosclerosis 11/21/2019  . COPD, mild ([place]) 09/27/2019  . Type 2 diabetes mellitus without complication, without long-term current use of insulin ([place]) 09/27/2019  . History of prostate cancer 08/03/2019    CBC Latest Ref Rng & Units 04/03/2020 10/05/2019 11/02/2009  WBC 4.0 - 10.5 K/uL 7.7 5.6 -  Hemoglobin 13.0 - 17.0 g/dL 14.1 11.3(A) 15.3  Hematocrit 39.0 - 52.0 % 42.6 36(A) 45.0  Platelets 150.0 - 400.0 K/uL 269.0 213 -   BMP Latest Ref Rng & Units 12/08/2020 04/03/2020 10/05/2019  Glucose 70 - 99 mg/dL - 132(H) -  BUN 6 - 23 mg/dL - 23 9  Creatinine 0.61 - 1.24 mg/dL 0.90 1.01 1.1  Sodium 135 - 145 mEq/L - 141 139  Potassium 3.5 - 5.1 mEq/L - 4.8 4.0  Chloride 96 - 112 mEq/L - 104 -  CO2 19 - 32 mEq/L - 33(H) -  Calcium 8.4 - 10.5 mg/dL - 9.9 -    Risks and benefits of  anesthesia discussed at length, patient or surrogate demonstrates understanding. Appropriately NPO. Plan to proceed with anesthesia.  Champ Mungo, MD 01/07/21

## 2021-01-07 NOTE — Op Note (Signed)
..  01/07/2021  9:08 AM    Alexander Dixon  [id number]   Pre-Op Dx:  epidermal inclusion cyst  Post-op Dx: epidermal inclusion cyst  Proc: Excision of right facial mass 2cm  Surg: Jeannie Fend Teige [facility]  Anes:  General by laryngeal mask  EBL:  None  Comp:  None  Findings:  Cyst like structure with epidermal pore removed in its entirety.  Superficial to fascia and no nerve structures seen or stimulated  Procedure: With the patient in a comfortable supine position, general laryngeal mask anesthesia was administered.  At an appropriate level, microscope was brought onto the field.  The patient was injected with 22ml of 1% Lidocaine with 1:100,000 epinephrine.  The patient was prepped and draped in a sterile fashion.  An elliptical incision along the relaxed skin tension lines was made surrounding the draining epidermal pore.  A lacrimal probe was placed into the pore to assist with dissection of the tract.  Using a 15 blade scalpel, careful dissection of the cystic mass was made circumferentially.  The cyst was opened to ensure size and that all cyst was removed.  Careful dissection was noted and no nerve like structures were identified.  Stimulation of the wound during dissection did not show any movement of the marginal mandibular branch.  After full removal of the cystic mass, the wound was irrigated with sterile saline.  Deep dermal vicryl 5.0 was placed and superficial interuppted 6.0 prolene was used to close the skin.  This was topped with Bacitracin ointment.   Following this  The patient was returned to anesthesia, awakened, and transferred to recovery in stable condition.  Dispo:  PACU to home  Plan: Follow up in 1 week.  Bacitracin to wound TID.   Prudencio Velazco 9:08 AM 01/07/2021

## 2021-01-08 ENCOUNTER — Other Ambulatory Visit: Payer: Self-pay

## 2021-01-08 ENCOUNTER — Ambulatory Visit (INDEPENDENT_AMBULATORY_CARE_PROVIDER_SITE_OTHER): Payer: Medicare Other | Admitting: Family Medicine

## 2021-01-08 VITALS — BP 142/70 | HR 67 | Temp 98.1°F | Ht 72.0 in | Wt 194.2 lb

## 2021-01-08 DIAGNOSIS — E7800 Pure hypercholesterolemia, unspecified: Secondary | ICD-10-CM

## 2021-01-08 DIAGNOSIS — I10 Essential (primary) hypertension: Secondary | ICD-10-CM | POA: Insufficient documentation

## 2021-01-08 DIAGNOSIS — E119 Type 2 diabetes mellitus without complications: Secondary | ICD-10-CM | POA: Diagnosis not present

## 2021-01-08 LAB — POCT GLYCOSYLATED HEMOGLOBIN (HGB A1C): Hemoglobin A1C: 7.2 % — AB (ref 4.0–5.6)

## 2021-01-08 LAB — SURGICAL PATHOLOGY

## 2021-01-08 NOTE — Patient Instructions (Addendum)
Would recommend getting the Covid shot and the Pneumonia Shot   Your blood pressure high.   High blood pressure increases your risk for heart attack and stroke.    Please check your blood pressure 2-4 times a week.   To check your blood pressure 1) Sit in a quiet and relaxed place for 5 minutes 2) Make sure your feet are flat on the ground 3) Consider checking first thing in the morning   Normal blood pressure is less than 140/90 Ideally you blood pressure should be around 120/80  Other ways you can reduce your blood pressure:  1) Regular exercise -- Try to get 150 minutes (30 minutes, 5 days a week) of moderate to vigorous aerobic excercise -- Examples: brisk walking (2.5 miles per hour), water aerobics, dancing, gardening, tennis, biking slower than 10 miles per hour 2) DASH Diet - low fat meats, more fresh fruits and vegetables, whole grains, low salt 3) Quit smoking if you smoke 4) Loose 5-10% of your body weight

## 2021-01-08 NOTE — Progress Notes (Signed)
Subjective:     Alexander Dixon is a 65 y.o. male presenting for Follow-up (3 month- DM )     HPI  #Diabetes Currently taking metformin (Glucophage, Riomet)  Using medications without difficulties: Yes Hypoglycemic episodes:No  Hyperglycemic episodes:No  Feet problems:No  Blood Sugars averaging: 120-150 Last HgbA1c:  Lab Results  Component Value Date   HGBA1C 7.2 (A) 01/08/2021    Diabetes Health Maintenance Due:    Diabetes Health Maintenance Due  Topic Date Due  . OPHTHALMOLOGY EXAM  03/10/2021  . FOOT EXAM  04/03/2021  . URINE MICROALBUMIN  04/03/2021  . HEMOGLOBIN A1C  07/08/2021      Review of Systems   Social History   Tobacco Use  Smoking Status Former Smoker  . Packs/day: 1.00  . Years: 30.00  . Pack years: 30.00  . Types: Cigarettes  . Quit date: 12/2000  . Years since quitting: 20.0  Smokeless Tobacco Never Used        Objective:    BP Readings from Last 3 Encounters:  01/08/21 (!) 142/70  01/07/21 (!) 142/74  11/13/20 132/78   Wt Readings from Last 3 Encounters:  01/08/21 194 lb 4 oz (88.1 kg)  01/07/21 192 lb (87.1 kg)  11/13/20 192 lb 11.2 oz (87.4 kg)    BP (!) 142/70   Pulse 67   Temp 98.1 F (36.7 C) (Temporal)   Ht 6' (1.829 m)   Wt 194 lb 4 oz (88.1 kg)   SpO2 99%   BMI 26.35 kg/m    Physical Exam Constitutional:      Appearance: Normal appearance. He is not ill-appearing or diaphoretic.  HENT:     Right Ear: External ear normal.     Left Ear: External ear normal.     Nose: Nose normal.  Eyes:     General: No scleral icterus.    Extraocular Movements: Extraocular movements intact.     Conjunctiva/sclera: Conjunctivae normal.  Cardiovascular:     Rate and Rhythm: Normal rate and regular rhythm.     Heart sounds: No murmur heard.   Pulmonary:     Effort: Pulmonary effort is normal. No respiratory distress.     Breath sounds: Normal breath sounds.  Musculoskeletal:     Cervical back: Neck supple.   Skin:    General: Skin is warm and dry.  Neurological:     Mental Status: He is alert. Mental status is at baseline.  Psychiatric:        Mood and Affect: Mood normal.           Assessment & Plan:   Problem List Items Addressed This Visit      Cardiovascular and Mediastinum   Benign essential HTN    Pt would like to work on weight loss and exercise. Discussed lisinopril as option to help with BP and kidney protection. Return 3 months for recheck.         Endocrine   Type 2 diabetes mellitus without complication, without long-term current use of insulin ([place]) - Primary    Improved with diet. Cont metformin 1000 mg bid.       Relevant Orders   POCT glycosylated hemoglobin (Hb A1C) (Completed)     Other   Pure hypercholesterolemia    Lab Results  Component Value Date   LDLCALC 67 10/08/2020   ldl at goal for DM. Cont atorvastatin.           Return in about 3 months (around 04/07/2021) for  Blood pressure follow-up.  Lesleigh Noe, MD  This visit occurred during the SARS-CoV-2 public health emergency.  Safety protocols were in place, including screening questions prior to the visit, additional usage of staff PPE, and extensive cleaning of exam room while observing appropriate contact time as indicated for disinfecting solutions.

## 2021-01-08 NOTE — Assessment & Plan Note (Signed)
Improved with diet. Cont metformin 1000 mg bid.

## 2021-01-08 NOTE — Assessment & Plan Note (Signed)
Pt would like to work on weight loss and exercise. Discussed lisinopril as option to help with BP and kidney protection. Return 3 months for recheck.

## 2021-01-08 NOTE — Assessment & Plan Note (Signed)
Lab Results  Component Value Date   LDLCALC 67 10/08/2020   ldl at goal for DM. Cont atorvastatin.

## 2021-01-30 ENCOUNTER — Ambulatory Visit (INDEPENDENT_AMBULATORY_CARE_PROVIDER_SITE_OTHER): Payer: Medicare Other

## 2021-01-30 ENCOUNTER — Other Ambulatory Visit: Payer: Self-pay

## 2021-01-30 DIAGNOSIS — Z0000 Encounter for general adult medical examination without abnormal findings: Secondary | ICD-10-CM | POA: Diagnosis not present

## 2021-01-30 NOTE — Patient Instructions (Signed)
Alexander Dixon , Thank you for taking time to come for your Medicare Wellness Visit. I appreciate your ongoing commitment to your health goals. Please review the following plan we discussed and let me know if I can assist you in the future.   Screening recommendations/referrals: Colonoscopy: Up to date, completed 04/21/2020, due 03/2025 Recommended yearly ophthalmology/optometry visit for glaucoma screening and checkup Recommended yearly dental visit for hygiene and checkup  Vaccinations: Influenza vaccine: declined Pneumococcal vaccine: declined Tdap vaccine: Up to date, completed 09/24/2019, due 08/2029 Shingles vaccine: declined   Covid-19: declined  Advanced directives: Advance directive discussed with you today. Even though you declined this today please call our office should you change your mind and we can give you the proper paperwork for you to fill out.  Conditions/risks identified: diabetes  Next appointment: Follow up in one year for your annual wellness visit.   Preventive Care 67 Years and Older, Male Preventive care refers to lifestyle choices and visits with your health care provider that can promote health and wellness. What does preventive care include?  A yearly physical exam. This is also called an annual well check.  Dental exams once or twice a year.  Routine eye exams. Ask your health care provider how often you should have your eyes checked.  Personal lifestyle choices, including:  Daily care of your teeth and gums.  Regular physical activity.  Eating a healthy diet.  Avoiding tobacco and drug use.  Limiting alcohol use.  Practicing safe sex.  Taking low doses of aspirin every day.  Taking vitamin and mineral supplements as recommended by your health care provider. What happens during an annual well check? The services and screenings done by your health care provider during your annual well check will depend on your age, overall health, lifestyle  risk factors, and family history of disease. Counseling  Your health care provider may ask you questions about your:  Alcohol use.  Tobacco use.  Drug use.  Emotional well-being.  Home and relationship well-being.  Sexual activity.  Eating habits.  History of falls.  Memory and ability to understand (cognition).  Work and work Statistician. Screening  You may have the following tests or measurements:  Height, weight, and BMI.  Blood pressure.  Lipid and cholesterol levels. These may be checked every 5 years, or more frequently if you are over 78 years old.  Skin check.  Lung cancer screening. You may have this screening every year starting at age 34 if you have a 30-pack-year history of smoking and currently smoke or have quit within the past 15 years.  Fecal occult blood test (FOBT) of the stool. You may have this test every year starting at age 39.  Flexible sigmoidoscopy or colonoscopy. You may have a sigmoidoscopy every 5 years or a colonoscopy every 10 years starting at age 63.  Prostate cancer screening. Recommendations will vary depending on your family history and other risks.  Hepatitis C blood test.  Hepatitis B blood test.  Sexually transmitted disease (STD) testing.  Diabetes screening. This is done by checking your blood sugar (glucose) after you have not eaten for a while (fasting). You may have this done every 1-3 years.  Abdominal aortic aneurysm (AAA) screening. You may need this if you are a current or former smoker.  Osteoporosis. You may be screened starting at age 35 if you are at high risk. Talk with your health care provider about your test results, treatment options, and if necessary, the need for more  tests. Vaccines  Your health care provider may recommend certain vaccines, such as:  Influenza vaccine. This is recommended every year.  Tetanus, diphtheria, and acellular pertussis (Tdap, Td) vaccine. You may need a Td booster every 10  years.  Zoster vaccine. You may need this after age 66.  Pneumococcal 13-valent conjugate (PCV13) vaccine. One dose is recommended after age 62.  Pneumococcal polysaccharide (PPSV23) vaccine. One dose is recommended after age 22. Talk to your health care provider about which screenings and vaccines you need and how often you need them. This information is not intended to replace advice given to you by your health care provider. Make sure you discuss any questions you have with your health care provider. Document Released: 12/12/2015 Document Revised: 08/04/2016 Document Reviewed: 09/16/2015 Elsevier Interactive Patient Education  2017 [place] Prevention in the Home Falls can cause injuries. They can happen to people of all ages. There are many things you can do to make your home safe and to help prevent falls. What can I do on the outside of my home?  Regularly fix the edges of walkways and driveways and fix any cracks.  Remove anything that might make you trip as you walk through a door, such as a raised step or threshold.  Trim any bushes or trees on the path to your home.  Use bright outdoor lighting.  Clear any walking paths of anything that might make someone trip, such as rocks or tools.  Regularly check to see if handrails are loose or broken. Make sure that both sides of any steps have handrails.  Any raised decks and porches should have guardrails on the edges.  Have any leaves, snow, or ice cleared regularly.  Use sand or salt on walking paths during winter.  Clean up any spills in your garage right away. This includes oil or grease spills. What can I do in the bathroom?  Use night lights.  Install grab bars by the toilet and in the tub and shower. Do not use towel bars as grab bars.  Use non-skid mats or decals in the tub or shower.  If you need to sit down in the shower, use a plastic, non-slip stool.  Keep the floor dry. Clean up any water that  spills on the floor as soon as it happens.  Remove soap buildup in the tub or shower regularly.  Attach bath mats securely with double-sided non-slip rug tape.  Do not have throw rugs and other things on the floor that can make you trip. What can I do in the bedroom?  Use night lights.  Make sure that you have a light by your bed that is easy to reach.  Do not use any sheets or blankets that are too big for your bed. They should not hang down onto the floor.  Have a firm chair that has side arms. You can use this for support while you get dressed.  Do not have throw rugs and other things on the floor that can make you trip. What can I do in the kitchen?  Clean up any spills right away.  Avoid walking on wet floors.  Keep items that you use a lot in easy-to-reach places.  If you need to reach something above you, use a strong step stool that has a grab bar.  Keep electrical cords out of the way.  Do not use floor polish or wax that makes floors slippery. If you must use wax, use non-skid floor  wax.  Do not have throw rugs and other things on the floor that can make you trip. What can I do with my stairs?  Do not leave any items on the stairs.  Make sure that there are handrails on both sides of the stairs and use them. Fix handrails that are broken or loose. Make sure that handrails are as long as the stairways.  Check any carpeting to make sure that it is firmly attached to the stairs. Fix any carpet that is loose or worn.  Avoid having throw rugs at the top or bottom of the stairs. If you do have throw rugs, attach them to the floor with carpet tape.  Make sure that you have a light switch at the top of the stairs and the bottom of the stairs. If you do not have them, ask someone to add them for you. What else can I do to help prevent falls?  Wear shoes that:  Do not have high heels.  Have rubber bottoms.  Are comfortable and fit you well.  Are closed at the  toe. Do not wear sandals.  If you use a stepladder:  Make sure that it is fully opened. Do not climb a closed stepladder.  Make sure that both sides of the stepladder are locked into place.  Ask someone to hold it for you, if possible.  Clearly mark and make sure that you can see:  Any grab bars or handrails.  First and last steps.  Where the edge of each step is.  Use tools that help you move around (mobility aids) if they are needed. These include:  Canes.  Walkers.  Scooters.  Crutches.  Turn on the lights when you go into a dark area. Replace any light bulbs as soon as they burn out.  Set up your furniture so you have a clear path. Avoid moving your furniture around.  If any of your floors are uneven, fix them.  If there are any pets around you, be aware of where they are.  Review your medicines with your doctor. Some medicines can make you feel dizzy. This can increase your chance of falling. Ask your doctor what other things that you can do to help prevent falls. This information is not intended to replace advice given to you by your health care provider. Make sure you discuss any questions you have with your health care provider. Document Released: 09/11/2009 Document Revised: 04/22/2016 Document Reviewed: 12/20/2014 Elsevier Interactive Patient Education  2017 [employer].

## 2021-01-30 NOTE — Progress Notes (Signed)
Subjective:   Alexander Dixon is a 65 y.o. male who presents for Medicare Annual/Subsequent preventive examination.  Review of Systems: N/A     I connected with the patient today by telephone and verified that I am speaking with the correct person using two identifiers. Location patient: home Location nurse: work Persons participating in the telephone visit: patient, nurse.   I discussed the limitations, risks, security and privacy concerns of performing an evaluation and management service by telephone and the availability of in person appointments. I also discussed with the patient that there may be a patient responsible charge related to this service. The patient expressed understanding and verbally consented to this telephonic visit.        Cardiac Risk Factors include: advanced age (>30mn, >>22women);male gender;diabetes mellitus     Objective:    Today's Vitals   There is no height or weight on file to calculate BMI.  Advanced Directives 01/30/2021 01/07/2021 10/27/2020 04/21/2020 12/26/2019  Does Patient Have a Medical Advance Directive? _0   Would patient like information on creating a medical advance directive? No - Patient declined No - Patient declined No - Patient declined - No - Patient declined    Current Medications (verified) Outpatient Encounter Medications as of 01/30/2021  Medication Sig   aspirin EC 81 MG tablet Take 81 mg by mouth daily. Swallow whole.   atorvastatin (LIPITOR) 10 MG tablet Take 1 tablet (10 mg total) by mouth daily.   Blood Glucose Monitoring Suppl (OPTUMRX BLOOD GLUCOSE METER) w/Device KIT Meter choice per insurance.   Cholecalciferol (VITAMIN D3) 50 MCG (2000 UT) TABS Take 2,000 Units by mouth daily.    glucose blood (ONETOUCH ULTRA) test strip Use to check blood sugar two times daily   Lancets (ONETOUCH DELICA PLUS [id number] MISC USE 1 LANCET TWICE DAILY FOR FINGERSTICK BS TESTING DX E11.9   latanoprost (XALATAN) 0.005  % ophthalmic solution Place 1 drop into both eyes at bedtime.    metFORMIN (GLUCOPHAGE) 1000 MG tablet TAKE 1 TABLET BY MOUTH  TWICE DAILY   tadalafil (CIALIS) 5 MG tablet Take by mouth.   No facility-administered encounter medications on file as of 01/30/2021.    Allergies (verified) Patient has no known allergies.   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   Diabetes mellitus without complication (HCC)    History of chicken pox    [place]    Pure hypercholesterolemia 11/21/2019   Past Surgical History:  Procedure Laterality Date   COLONOSCOPY WITH PROPOFOL N/A 04/21/2020   Procedure: COLONOSCOPY WITH PROPOFOL;  Surgeon: AJonathon Bellows MD;  [facility]  Service: Gastroenterology;  Laterality: N/A;  POSITIVE 3/23   CONTINUOUS NERVE MONITORING N/A 01/07/2021   Procedure: FACIAL NERVE MONITORING;  Surgeon: VCarloyn Manner MD;  [facility]  Service: ENT;  Laterality: N/A;   EAR CYST EXCISION Right 01/07/2021   Procedure: EXCISION EPIDERMAL INCLUSION CYST;  Surgeon: VCarloyn Manner MD;  [facility]  Service: ENT;  Laterality: Right;   HERNIA REPAIR     inguinal   KNEE ARTHROSCOPY Right 1984   PROSTATE SURGERY     due to prostate cancer   Family History  Problem Relation Age of Onset   Leukemia Mother    Arthritis Mother    Diabetes Mother    Alcohol abuse Father    COPD Father  Arthritis Maternal Grandmother    Diabetes Maternal Grandmother    Hearing loss Maternal Grandmother    Arthritis Maternal Grandfather    Heart attack Maternal Grandfather 52   Diabetes Maternal Grandfather    Heart disease Maternal Grandfather    Hyperlipidemia Maternal Grandfather    Hypertension Maternal Grandfather    Prostate cancer Maternal Grandfather    Prostate cancer Maternal Uncle    Diabetes Maternal Uncle    Prostate  cancer Cousin    Diabetes Maternal Uncle    Social History   Socioeconomic History   Marital status: Married    Spouse name: [name]   Number of children: 2   Years of education: associates degree   Highest education level: Not on file  Occupational History   Not on file  Tobacco Use   Smoking status: Former Smoker    Packs/day: 1.00    Years: 30.00    Pack years: 30.00    Types: Cigarettes    Quit date: 12/2000    Years since quitting: 20.0   Smokeless tobacco: Never Used  Vaping Use   Vaping Use: Never used  Substance and Sexual Activity   Alcohol use: Not Currently    Comment: once in a blue moon   Drug use: Never   Sexual activity: Yes    Birth control/protection: Post-menopausal, Surgical  Other Topics Concern   Not on file  Social History Narrative   11/05/19   From: yes, born in [place]   Living: with [name]   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      Family: 2 daughters - Programmer, applications and [name] - grandchildren - 29 (most are adults)      Enjoys: gym, working around [employer], stay active - shopping      Exercise: 5 days a week   Diet: better than it used to be, avoids meat - lots of veggies and avoids sweets, mostly water      Safety   Seat belts: Yes    Guns: Yes  and secure   Safe in relationships: Yes    Social Determinants of Radio broadcast assistant Strain: Low Risk    Difficulty of Paying Living Expenses: Not hard at all  Food Insecurity: No Food Insecurity   Worried About Charity fundraiser in the Last Year: Never true   [place] in the Last Year: Never true  Transportation Needs: No Transportation Needs   Lack of Transportation (Medical): No   Lack of Transportation (Non-Medical): No  Physical Activity: Sufficiently Active   Days of Exercise per Week: 7 days   Minutes of Exercise per Session: 30 min  Stress: No Stress Concern Present   Feeling of Stress : Not at all  Social Connections: Not on file    Tobacco  Counseling Counseling given: Not Answered   Clinical Intake:  Pre-visit preparation completed: Yes  Pain : No/denies pain     Nutritional Risks: None Diabetes: Yes CBG done?: No Did pt. bring in CBG monitor from home?: No  How often do you need to have someone help you when you read instructions, pamphlets, or other written materials from your doctor or pharmacy?: 1 - Never  Diabetic: Yes Nutrition Risk Assessment:  Has the patient had any N/V/D within the last 2 months?  No  Does the patient have any non-healing wounds?  No  Has the patient had any unintentional weight loss or weight gain?  No   Diabetes:  Is the patient diabetic?  Yes  If diabetic, was a CBG obtained today?  No telephone visit  Did the patient bring in their glucometer from home?  No  telephone visit  How often do you monitor your CBG's? daily.   Financial Strains and Diabetes Management:  Are you having any financial strains with the device, your supplies or your medication? No .  Does the patient want to be seen by Chronic Care Management for management of their diabetes?  No  Would the patient like to be referred to a Nutritionist or for Diabetic Management?  No   Diabetic Exams:  Diabetic Eye Exam: Completed 03/10/2020 Diabetic Foot Exam: Completed 04/03/2020   Interpreter Needed?: No  Information entered by :: CJohnson, LPN   Activities of Daily Living In your present state of health, do you have any difficulty performing the following activities: 01/30/2021 01/07/2021  Hearing? N N  Vision? N N  Difficulty concentrating or making decisions? N N  Walking or climbing stairs? N N  Dressing or bathing? N N  Doing errands, shopping? N -  Preparing Food and eating ? N -  Using the Toilet? N -  In the past six months, have you accidently leaked urine? Y -  Do you have problems with loss of bowel control? N -  Managing your Medications? N -  Managing your Finances? N -  Housekeeping or  managing your Housekeeping? N -  Some recent data might be hidden    Patient Care Team: Lesleigh Noe, MD as PCP - General (Family Medicine)  Indicate any recent Medical Services you may have received from other than [facility] providers in the past year (date may be approximate).     Assessment:   This is a routine wellness examination for [employer].  Hearing/Vision screen  Hearing Screening   _0  _1  _2  _3  _4  _5  _6  _7  _8   Right ear:           Left ear:           Vision Screening Comments: Patient gets annual eye exams   Dietary issues and exercise activities discussed: Current Exercise Habits: Home exercise routine, Type of exercise: Other - see comments (cardio), Time (Minutes): 30, Frequency (Times/Week): 7, Weekly Exercise (Minutes/Week): 210, Intensity: Moderate, Exercise limited by: None identified  Goals     Patient Stated     12/26/2019, I will maintain and continue medications as prescribed.      Patient Stated     01/30/2021, I will continue to do cardio everyday for 30 minutes      Depression Screen PHQ 2/9 Scores 01/30/2021 10/27/2020 12/26/2019 11/05/2019  PHQ - 2 Score 0 0 0 0  PHQ- 9 Score 0 - 0 -    Fall Risk Fall Risk  01/30/2021 11/13/2020 11/06/2020 10/30/2020 10/27/2020  Falls in the past year? 0 0 0 0 0  Number falls in past yr: 0 - - - -  Injury with Fall? 0 - - - -  Risk for fall due to : Medication side effect - - - -  Follow up Falls evaluation completed;Falls prevention discussed - - - -    FALL RISK PREVENTION PERTAINING TO THE HOME:  Any stairs in or around the home? Yes  If so, are there any without handrails? No  Home free of loose throw rugs in walkways, pet beds, electrical cords, etc? Yes  Adequate lighting in your home to reduce risk of falls? Yes   ASSISTIVE DEVICES  UTILIZED TO PREVENT FALLS:  Life alert? no Use of a cane, walker or w/c? No  Grab bars in the bathroom? Yes  Shower chair or bench in shower?  No  Elevated toilet seat or a handicapped toilet? No   TIMED UP AND GO:  Was the test performed? N/A virtual/telephone visit .   Cognitive Function: MMSE - Mini Mental State Exam 01/30/2021 12/26/2019  Not completed: Refused -  Orientation to time - 5  Orientation to Place - 5  Registration - 3  Attention/ Calculation - 5  Recall - 3  Language- repeat - 1  Mini Cog  Mini-Cog screen was not completed. Patient did not feel the need to do this. Maximum score is 22. A value of 0 denotes this part of the MMSE was not completed or the patient failed this part of the Mini-Cog screening.       Immunizations Immunization History  Administered Date(s) Administered   Tdap 09/24/2019    TDAP status: Up to date  Flu Vaccine status: Declined, Education has been provided regarding the importance of this vaccine but patient still declined. Advised may receive this vaccine at local pharmacy or Health Dept. Aware to provide a copy of the vaccination record if obtained from local pharmacy or Health Dept. Verbalized acceptance and understanding.  Pneumococcal vaccine status: Declined,  Education has been provided regarding the importance of this vaccine but patient still declined. Advised may receive this vaccine at local pharmacy or Health Dept. Aware to provide a copy of the vaccination record if obtained from local pharmacy or Health Dept. Verbalized acceptance and understanding.   Covid-19 vaccine status: Declined, Education has been provided regarding the importance of this vaccine but patient still declined. Advised may receive this vaccine at local pharmacy or Health Dept.or vaccine clinic. Aware to provide a copy of the vaccination record if obtained from local pharmacy or Health Dept. Verbalized acceptance and understanding.  Qualifies for Shingles Vaccine? Yes   Zostavax completed No   Shingrix Completed?: No.    Education has been provided regarding the importance of this vaccine. Patient  has been advised to call insurance company to determine out of pocket expense if they have not yet received this vaccine. Advised may also receive vaccine at local pharmacy or Health Dept. Verbalized acceptance and understanding.  Screening Tests Health Maintenance  Topic Date Due   COVID-19 Vaccine (1) Never done   PNA vac Low Risk Adult (1 of 2 - PCV13) Never done   OPHTHALMOLOGY EXAM  03/10/2021   FOOT EXAM  04/03/2021   URINE MICROALBUMIN  04/03/2021   HEMOGLOBIN A1C  07/08/2021   COLONOSCOPY (Pts 45-37yr Insurance coverage will need to be confirmed)  04/21/2025   TETANUS/TDAP  09/23/2029   Hepatitis C Screening  Completed   HPV VACCINES  Aged Out   INFLUENZA VACCINE  Discontinued    Health Maintenance  Health Maintenance Due  Topic Date Due   COVID-19 Vaccine (1) Never done   PNA vac Low Risk Adult (1 of 2 - PCV13) Never done    Colorectal cancer screening: Type of screening: Colonoscopy. Completed 04/21/2020. Repeat every 5 years  Lung Cancer Screening: (Low Dose CT Chest recommended if Age 67-80years, 30 pack-year currently smoking OR have quit w/in 15years.) does not qualify.   Additional Screening:  Hepatitis C Screening: does qualify; Completed 12/27/2019   Vision Screening: Recommended annual ophthalmology exams for early detection of glaucoma and other disorders of the eye. Is the patient up  to date with their annual eye exam?  Yes  Who is the provider or what is the name of the office in which the patient attends annual eye exams? [facility] [place] If pt is not established with a provider, would they like to be referred to a provider to establish care? No .   Dental Screening: Recommended annual dental exams for proper oral hygiene  Community Resource Referral / Chronic Care Management: CRR required this visit?  No   CCM required this visit?  No      Plan:     I have personally reviewed and noted the following in the patients chart:     Medical and social history  Use of alcohol, tobacco or illicit drugs   Current medications and supplements  Functional ability and status  Nutritional status  Physical activity  Advanced directives  List of other physicians  Hospitalizations, surgeries, and ER visits in previous 12 months  Vitals  Screenings to include cognitive, depression, and falls  Referrals and appointments  In addition, I have reviewed and discussed with patient certain preventive protocols, quality metrics, and best practice recommendations. A written personalized care plan for preventive services as well as general preventive health recommendations were provided to patient.   Due to this being a virtual/telephonic visit, the after visit summary with patients personalized plan was offered to patient via office or my-chart. Patient preferred to pick up at office at next visit or via mychart.   Andrez Grime, LPN   12/05/7423

## 2021-01-30 NOTE — Progress Notes (Signed)
PCP notes:  Health Maintenance: Declined flu, prevnar and covid vaccines   Abnormal Screenings: None   Patient concerns: Moles on his back    Nurse concerns: none   Next PCP appt.: 04/07/2021 @ 9:40 am

## 2021-02-20 ENCOUNTER — Telehealth: Payer: Self-pay

## 2021-02-20 ENCOUNTER — Other Ambulatory Visit: Payer: Self-pay

## 2021-02-20 DIAGNOSIS — E119 Type 2 diabetes mellitus without complications: Secondary | ICD-10-CM

## 2021-02-20 MED ORDER — ONETOUCH DELICA PLUS LANCET30G MISC: 3 refills | Status: DC

## 2021-02-20 NOTE — Telephone Encounter (Signed)
Pt left v/m that [facility] requested refill for lancets on 02/17/21 and again on 02/19/21. Pt needs new rx for lancets and request cb.

## 2021-02-20 NOTE — Telephone Encounter (Signed)
Spoke with patient and informed him that the rx refill had been sent today. Patient very appreciative of call.

## 2021-02-26 ENCOUNTER — Other Ambulatory Visit: Payer: Self-pay

## 2021-02-26 DIAGNOSIS — E119 Type 2 diabetes mellitus without complications: Secondary | ICD-10-CM

## 2021-02-26 MED ORDER — ONETOUCH DELICA LANCETS 33G MISC: 1.0000 | Freq: Two times a day (BID) | 1 refills | Status: DC

## 2021-02-26 NOTE — Telephone Encounter (Signed)
Pt said he received the 30 G lancets from [facility]. Pt said the 30 G lancets hurt a lot and pt has to actually push that lancet in his thick skin. Pt is requesting the 33 G lancets sent to [facility] rx. Pt request cb when done.

## 2021-02-26 NOTE — Telephone Encounter (Signed)
33G lancets ordered as requested.

## 2021-02-27 NOTE — Telephone Encounter (Signed)
Pt left v/m that [facility] received the rx for 33 G lancets but since had sent out the 30 G [facility] d/c 33 G rx. Pt wants 33 G lancets sent to [facility].

## 2021-02-27 NOTE — Addendum Note (Signed)
Addended by: Helene Shoe on: 02/27/2021 12:40 PM   Modules accepted: Orders

## 2021-03-24 DIAGNOSIS — H401132 Primary open-angle glaucoma, bilateral, moderate stage: Secondary | ICD-10-CM | POA: Diagnosis not present

## 2021-03-27 DIAGNOSIS — E119 Type 2 diabetes mellitus without complications: Secondary | ICD-10-CM | POA: Diagnosis not present

## 2021-03-27 LAB — HM DIABETES EYE EXAM

## 2021-03-31 ENCOUNTER — Encounter: Payer: Self-pay | Admitting: Family Medicine

## 2021-03-31 ENCOUNTER — Other Ambulatory Visit: Payer: Self-pay | Admitting: Family Medicine

## 2021-03-31 DIAGNOSIS — E119 Type 2 diabetes mellitus without complications: Secondary | ICD-10-CM

## 2021-03-31 DIAGNOSIS — I6521 Occlusion and stenosis of right carotid artery: Secondary | ICD-10-CM

## 2021-03-31 DIAGNOSIS — E7800 Pure hypercholesterolemia, unspecified: Secondary | ICD-10-CM

## 2021-04-07 ENCOUNTER — Ambulatory Visit (INDEPENDENT_AMBULATORY_CARE_PROVIDER_SITE_OTHER): Payer: Medicare Other | Admitting: Family Medicine

## 2021-04-07 ENCOUNTER — Encounter: Payer: Self-pay | Admitting: Family Medicine

## 2021-04-07 ENCOUNTER — Other Ambulatory Visit: Payer: Self-pay

## 2021-04-07 VITALS — BP 138/70 | HR 62 | Temp 97.9°F | Ht 72.0 in | Wt 191.2 lb

## 2021-04-07 DIAGNOSIS — N39498 Other specified urinary incontinence: Secondary | ICD-10-CM

## 2021-04-07 DIAGNOSIS — E7800 Pure hypercholesterolemia, unspecified: Secondary | ICD-10-CM

## 2021-04-07 DIAGNOSIS — E119 Type 2 diabetes mellitus without complications: Secondary | ICD-10-CM

## 2021-04-07 DIAGNOSIS — I10 Essential (primary) hypertension: Secondary | ICD-10-CM | POA: Diagnosis not present

## 2021-04-07 DIAGNOSIS — R32 Unspecified urinary incontinence: Secondary | ICD-10-CM | POA: Insufficient documentation

## 2021-04-07 DIAGNOSIS — Z8546 Personal history of malignant neoplasm of prostate: Secondary | ICD-10-CM

## 2021-04-07 LAB — COMPREHENSIVE METABOLIC PANEL
ALT: 14 U/L (ref 0–53)
AST: 15 U/L (ref 0–37)
Albumin: 4.6 g/dL (ref 3.5–5.2)
Alkaline Phosphatase: 73 U/L (ref 39–117)
BUN: 25 mg/dL — ABNORMAL HIGH (ref 6–23)
CO2: 32 mEq/L (ref 19–32)
Calcium: 10.1 mg/dL (ref 8.4–10.5)
Chloride: 102 mEq/L (ref 96–112)
Creatinine, Ser: 1.01 mg/dL (ref 0.40–1.50)
GFR: 77.58 mL/min (ref >60.00)
Glucose, Bld: 153 mg/dL — ABNORMAL HIGH (ref 70–99)
Potassium: 5.1 mEq/L (ref 3.5–5.1)
Sodium: 140 mEq/L (ref 135–145)
Total Bilirubin: 0.6 mg/dL (ref 0.2–1.2)
Total Protein: 7.4 g/dL (ref 6.0–8.3)

## 2021-04-07 LAB — LIPID PANEL
Cholesterol: 142 mg/dL (ref 0–200)
HDL: 42.6 mg/dL (ref >39.00)
LDL Cholesterol: 81 mg/dL (ref 0–99)
NonHDL: 99.69
Total CHOL/HDL Ratio: 3
Triglycerides: 93 mg/dL (ref 0.0–149.0)
VLDL: 18.6 mg/dL (ref 0.0–40.0)

## 2021-04-07 LAB — MICROALBUMIN / CREATININE URINE RATIO
Creatinine,U: 117.9 mg/dL
Microalb Creat Ratio: 0.6 mg/g (ref 0.0–30.0)
Microalb, Ur: <0.7 mg/dL (ref 0.0–1.9)

## 2021-04-07 LAB — POCT GLYCOSYLATED HEMOGLOBIN (HGB A1C): Hemoglobin A1C: 6.8 % — AB (ref 4.0–5.6)

## 2021-04-07 LAB — PSA, MEDICARE: PSA: 0 ng/ml — ABNORMAL LOW (ref 0.10–4.00)

## 2021-04-07 NOTE — Assessment & Plan Note (Signed)
Diet/exercise controlled. Improved today. Discussed benefit of lisinopril but pt would like to remain off medication at this time. Cont exercise/diet.

## 2021-04-07 NOTE — Patient Instructions (Addendum)
Diabetes is looking good  Seborrheic Keratosis are your moles of concern  Can also call Dermatology for skin check if you want  Return in 6 months

## 2021-04-07 NOTE — Assessment & Plan Note (Signed)
Excellent control.  Lab Results  Component Value Date   HGBA1C 6.8 (A) 04/07/2021   Discussed continuing metformin 1000 mg bid. If still <7 at next visit could consider dose decrease. Cont DM diet

## 2021-04-07 NOTE — Assessment & Plan Note (Signed)
Following with [facility] - q6 months. But would like to switch to  location.  Lab Results  Component Value Date   PSA 0.00 Repeated and verified X2. (L) 04/03/2020   PSA 0.00 (L) 12/27/2019   PSA 8.05 08/15/2019

## 2021-04-07 NOTE — Progress Notes (Signed)
Subjective:     Alexander Dixon is a 65 y.o. male presenting for Follow-up (DM and BP)     HPI   #Diabetes Currently taking metformin  Using medications without difficulties: No Hypoglycemic episodes:No  Hyperglycemic episodes:No  Feet problems:No  Blood Sugars averaging: 99-120 Last HgbA1c:  Lab Results  Component Value Date   HGBA1C 6.8 (A) 04/07/2021    Diabetes Health Maintenance Due:    Diabetes Health Maintenance Due  Topic Date Due  . URINE MICROALBUMIN  04/03/2021  . HEMOGLOBIN A1C  10/08/2021  . OPHTHALMOLOGY EXAM  03/27/2022  . FOOT EXAM  04/07/2022   #Elevated bp - improved on diet and exercise    Review of Systems  01/08/2021: Clinic - DM - improved with diet. HTN - diet/exercise.   Social History   Tobacco Use  Smoking Status Former Smoker  . Packs/day: 1.00  . Years: 30.00  . Pack years: 30.00  . Types: Cigarettes  . Quit date: 12/2000  . Years since quitting: 20.2  Smokeless Tobacco Never Used        Objective:    BP Readings from Last 3 Encounters:  04/07/21 138/70  01/08/21 (!) 142/70  01/07/21 (!) 142/74   Wt Readings from Last 3 Encounters:  04/07/21 191 lb 4 oz (86.8 kg)  01/08/21 194 lb 4 oz (88.1 kg)  01/07/21 192 lb (87.1 kg)    BP 138/70   Pulse 62   Temp 97.9 F (36.6 C) (Temporal)   Ht 6' (1.829 m)   Wt 191 lb 4 oz (86.8 kg)   SpO2 98%   BMI 25.94 kg/m    Physical Exam Constitutional:      Appearance: Normal appearance. He is not ill-appearing or diaphoretic.  HENT:     Right Ear: External ear normal.     Left Ear: External ear normal.  Eyes:     General: No scleral icterus.    Extraocular Movements: Extraocular movements intact.     Conjunctiva/sclera: Conjunctivae normal.  Cardiovascular:     Rate and Rhythm: Normal rate and regular rhythm.     Heart sounds: No murmur heard.   Pulmonary:     Effort: Pulmonary effort is normal. No respiratory distress.     Breath sounds: Normal breath  sounds. No wheezing.  Musculoskeletal:     Cervical back: Neck supple.  Skin:    General: Skin is warm and dry.  Neurological:     Mental Status: He is alert. Mental status is at baseline.  Psychiatric:        Mood and Affect: Mood normal.     Diabetic Foot Exam - Simple   Simple Foot Form Diabetic Foot exam was performed with the following findings: Yes 04/07/2021 10:06 AM  Visual Inspection No deformities, no ulcerations, no other skin breakdown bilaterally: Yes Sensation Testing Intact to touch and monofilament testing bilaterally: Yes Pulse Check Posterior Tibialis and Dorsalis pulse intact bilaterally: Yes Comments         Assessment & Plan:   Problem List Items Addressed This Visit      Cardiovascular and Mediastinum   Benign essential HTN    Diet/exercise controlled. Improved today. Discussed benefit of lisinopril but pt would like to remain off medication at this time. Cont exercise/diet.         Endocrine   Type 2 diabetes mellitus without complication, without long-term current use of insulin (HCC) - Primary    Excellent control.  Lab Results  Component Value  Date   HGBA1C 6.8 (A) 04/07/2021   Discussed continuing metformin 1000 mg bid. If still <7 at next visit could consider dose decrease. Cont DM diet      Relevant Orders   POCT glycosylated hemoglobin (Hb A1C) (Completed)   Microalbumin / creatinine urine ratio     Other   History of prostate cancer    Following with [facility] - q6 months. But would like to switch to [place] location.  Lab Results  Component Value Date   PSA 0.00 Repeated and verified X2. (L) 04/03/2020   PSA 0.00 (L) 12/27/2019   PSA 8.05 08/15/2019          Relevant Orders   Ambulatory referral to Urology   Ambulatory referral to Physical Therapy   PSA, Medicare   Pure hypercholesterolemia   Relevant Orders   Comprehensive metabolic panel   Lipid panel   Absence of bladder continence    S/p prostate  cancer treatment. Is seeing pelvic floor in [place] but would like someone closer and a second opinion. Referral placed.           Return in about 6 months (around 10/08/2021).  Lesleigh Noe, MD  This visit occurred during the SARS-CoV-2 public health emergency.  Safety protocols were in place, including screening questions prior to the visit, additional usage of staff PPE, and extensive cleaning of exam room while observing appropriate contact time as indicated for disinfecting solutions.

## 2021-04-07 NOTE — Assessment & Plan Note (Signed)
S/p prostate cancer treatment. Is seeing pelvic floor in [place] but would like someone closer and a second opinion. Referral placed.

## 2021-04-23 DIAGNOSIS — M6289 Other specified disorders of muscle: Secondary | ICD-10-CM | POA: Diagnosis not present

## 2021-04-23 DIAGNOSIS — M6281 Muscle weakness (generalized): Secondary | ICD-10-CM | POA: Diagnosis not present

## 2021-04-23 DIAGNOSIS — M62838 Other muscle spasm: Secondary | ICD-10-CM | POA: Diagnosis not present

## 2021-05-01 ENCOUNTER — Other Ambulatory Visit: Payer: Self-pay

## 2021-05-01 MED ORDER — ONETOUCH DELICA LANCETS 33G MISC: 3 refills | Status: DC

## 2021-05-01 NOTE — Telephone Encounter (Signed)
Pt called to verify lancets with 9 G was sent to [facility]; advised pt that [facility] had requested by fax the refill and refill had already been done. Pt voiced understanding and very appreciative. Nothing further needed at this time.

## 2021-05-06 DIAGNOSIS — M62838 Other muscle spasm: Secondary | ICD-10-CM | POA: Diagnosis not present

## 2021-05-06 DIAGNOSIS — M6289 Other specified disorders of muscle: Secondary | ICD-10-CM | POA: Diagnosis not present

## 2021-05-06 DIAGNOSIS — M6281 Muscle weakness (generalized): Secondary | ICD-10-CM | POA: Diagnosis not present

## 2021-05-20 DIAGNOSIS — M62838 Other muscle spasm: Secondary | ICD-10-CM | POA: Diagnosis not present

## 2021-05-20 DIAGNOSIS — M6281 Muscle weakness (generalized): Secondary | ICD-10-CM | POA: Diagnosis not present

## 2021-06-25 ENCOUNTER — Other Ambulatory Visit: Payer: Self-pay | Admitting: Primary Care

## 2021-08-27 ENCOUNTER — Other Ambulatory Visit: Payer: Self-pay | Admitting: Family Medicine

## 2021-09-22 IMAGING — CT CT MAXILLOFACIAL W/ CM
1 series · 1 of 1 positions shown · IV contrast (omnipaque)
Comparison: None.

CLINICAL DATA: Sebaceous cyst.

EXAM:
CT MAXILLOFACIAL WITH CONTRAST
TECHNIQUE: Multidetector CT imaging of the maxillofacial structures was
performed with intravenous contrast. Multiplanar CT image
reconstructions were also generated.
CONTRAST:  75mL OMNIPAQUE IOHEXOL 300 MG/ML  SOLN

[Series 1: topogram 0.60 sag · sagittal · 1.00mm/px · 1 of 1 slices shown]
[im 1/1]
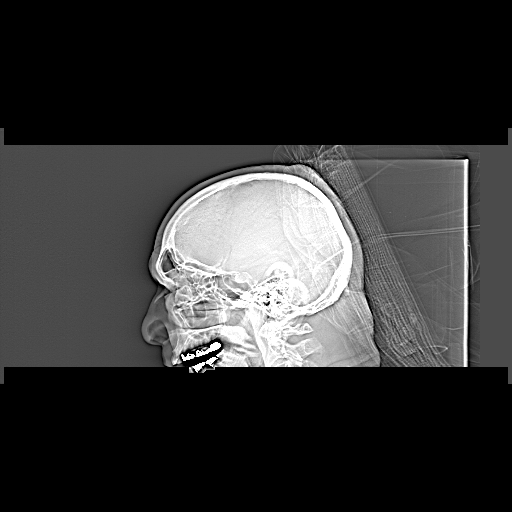

[1 of 1 positions shown; findings below may reference images not displayed]

FINDINGS: Osseous: No fracture or mandibular dislocation. No destructive
process.

Orbits: Negative. No traumatic or inflammatory finding.

Sinuses: Clear.

Soft tissues: Focal skin thickening with cystic appearance measuring
approximately 4-5 mm in the right submandibular region, at the
region of concern. No evidence of deep soft tissue tissue extension

Limited intracranial: No significant or unexpected finding.
IMPRESSION: Focal skin thickening with cystic appearance measuring approximately
4-5 mm in the right submandibular region, at the region of concern
most likely representing an epidermal inclusion cyst. No evidence of
deep soft tissue extension.

## 2021-09-25 DIAGNOSIS — H401132 Primary open-angle glaucoma, bilateral, moderate stage: Secondary | ICD-10-CM | POA: Diagnosis not present

## 2021-10-08 ENCOUNTER — Ambulatory Visit: Payer: Medicare Other | Admitting: Family Medicine

## 2021-10-20 ENCOUNTER — Other Ambulatory Visit: Payer: Self-pay | Admitting: Family Medicine

## 2021-10-20 DIAGNOSIS — E119 Type 2 diabetes mellitus without complications: Secondary | ICD-10-CM

## 2021-11-20 ENCOUNTER — Other Ambulatory Visit: Payer: Self-pay | Admitting: Family Medicine

## 2021-12-09 ENCOUNTER — Encounter: Payer: Self-pay | Admitting: Urology

## 2021-12-09 ENCOUNTER — Ambulatory Visit: Payer: Medicare Other | Admitting: Urology

## 2021-12-09 ENCOUNTER — Other Ambulatory Visit: Payer: Self-pay

## 2021-12-09 VITALS — BP 151/68 | HR 81 | Ht 72.0 in | Wt 187.0 lb

## 2021-12-09 DIAGNOSIS — N433 Hydrocele, unspecified: Secondary | ICD-10-CM

## 2021-12-09 DIAGNOSIS — N529 Male erectile dysfunction, unspecified: Secondary | ICD-10-CM

## 2021-12-09 DIAGNOSIS — N9989 Other postprocedural complications and disorders of genitourinary system: Secondary | ICD-10-CM | POA: Diagnosis not present

## 2021-12-09 DIAGNOSIS — N393 Stress incontinence (female) (male): Secondary | ICD-10-CM

## 2021-12-09 DIAGNOSIS — C61 Malignant neoplasm of prostate: Secondary | ICD-10-CM | POA: Diagnosis not present

## 2021-12-09 MED ORDER — SILDENAFIL CITRATE 100 MG PO TABS: 100.0000 mg | ORAL_TABLET | Freq: Every day | ORAL | 5 refills | Status: DC | PRN

## 2021-12-09 NOTE — Progress Notes (Signed)
12/09/2021 1:04 PM   Alexander Dixon 10-20-1956 [id number]  Referring provider: Lesleigh Noe, MD [place],  [place] [address]  Chief Complaint  Patient presents with   Other    HPI: Alexander Dixon is a 66 y.o. male who presents to establish local urologic care.  Summary of urologic history:  TRUS/biopsy September 2010 [facility]; PSA 7.39; benign pathology Rebiopsy 05/2015; PSA 9.37; volume 118 cc; Path focal Gleason 3+3; elected active surveillance MRI 11/2015 no evidence of extracapsular disease; confirmatory biopsy 11/2015 1 core Gleason 3+3 adenocarcinoma; volume 126 cc Rebiopsy 06/2019; 2 cores Gleason 3+4 adenocarcinoma Elected RARP by Dr. Thomos Dixon at [facility]; pT2N0 PSA has remained undetectable and last checked 03/2021 and was 0 Complains of bothersome stress incontinence.  Underwent physical therapy without improvement.  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   FVC 71% and FEV1 73% untreated   Diabetes mellitus without complication ([place])    History of chicken pox    Prostate cancer ([place])    Pure hypercholesterolemia 11/21/2019    Surgical History: Past Surgical History:  Procedure Laterality Date   COLONOSCOPY WITH PROPOFOL N/A 04/21/2020   Procedure: COLONOSCOPY WITH PROPOFOL;  Surgeon: Alexander Bellows, MD;  Location: [facility] ENDOSCOPY;  Service: Gastroenterology;  Laterality: N/A;  POSITIVE 3/23   CONTINUOUS NERVE MONITORING N/A 01/07/2021   Procedure: FACIAL NERVE MONITORING;  Surgeon: Alexander Manner, MD;  Location: [place];  Service: ENT;  Laterality: N/A;   EAR CYST EXCISION Right 01/07/2021   Procedure: EXCISION EPIDERMAL INCLUSION CYST;  Surgeon:  Alexander Manner, MD;  Location: [place];  Service: ENT;  Laterality: Right;   HERNIA REPAIR     inguinal   KNEE ARTHROSCOPY Right 1984   PROSTATE SURGERY     due to prostate cancer    Home Medications:  Allergies as of 12/09/2021   No Known Allergies      Medication List        Accurate as of December 09, 2021  1:04 PM. If you have any questions, ask your nurse or doctor.          aspirin EC 81 MG tablet Take 81 mg by mouth daily. Swallow whole.   atorvastatin 10 MG tablet Commonly known as: LIPITOR TAKE 1 TABLET BY MOUTH  DAILY   latanoprost 0.005 % ophthalmic solution Commonly known as: XALATAN Place 1 drop into both eyes at bedtime.   metFORMIN 1000 MG tablet Commonly known as: GLUCOPHAGE TAKE 1 TABLET BY MOUTH  TWICE DAILY   OneTouch Delica Plus [id number] Misc USE TO CHECK BLOOD SUGAR TWICE  DAILY   OneTouch Ultra test strip Generic drug: glucose blood USE TO CHECK BLOOD SUGAR  TWICE DAILY   [employer] Blood Glucose Meter w/Device Kit Meter choice per insurance.   tadalafil 5 MG tablet Commonly known as: CIALIS Take by mouth.   Vitamin D3 50 MCG (2000 UT) Tabs Take 2,000 Units by mouth daily.        Allergies: No Known Allergies  Family History: Family History  Problem Relation Age of Onset   Leukemia Mother    Arthritis Mother    Diabetes Mother    Alcohol  abuse Father    COPD Father    Arthritis Maternal Grandmother    Diabetes Maternal Grandmother    Hearing loss Maternal Grandmother    Arthritis Maternal Grandfather    Heart attack Maternal Grandfather 52   Diabetes Maternal Grandfather    Heart disease Maternal Grandfather    Hyperlipidemia Maternal Grandfather    Hypertension Maternal Grandfather    Prostate cancer Maternal Grandfather    Prostate cancer Maternal Uncle    Diabetes Maternal Uncle    Prostate cancer Cousin    Diabetes Maternal Uncle     Social History:  reports that he quit smoking about 20 years  ago. His smoking use included cigarettes. He has a 30.00 pack-year smoking history. He has never used smokeless tobacco. He reports that he does not currently use alcohol. He reports that he does not use drugs.   Physical Exam: BP (!) 151/68    Pulse 81    Ht 6' (1.829 m)    Wt 187 lb (84.8 kg)    BMI 25.36 kg/m   Constitutional:  Alert and oriented, No acute distress. HEENT: [place] AT, moist mucus membranes.  Trachea midline, no masses. Cardiovascular: No clubbing, cyanosis, or edema. Respiratory: Normal respiratory effort, no increased work of breathing. GI: Abdomen is soft, nontender, nondistended, no abdominal masses GU: Large right hydrocele >8 cm with some extension into the groin region though definite hernia not appreciated Skin: No rashes, bruises or suspicious lesions. Neurologic: Grossly intact, no focal deficits, moving all 4 extremities. Psychiatric: Normal mood and affect.   Assessment & Plan:    pT2 Gleason 3+4 adenocarcinoma prostate 2 years status post radical prostatectomy with undetectable PSA PSA repeated today  2.  Post prostatectomy incontinence Interested in artificial urinary sphincter and will schedule appointment Alexander Dixon  3.  Right hydrocele Large right hydrocele some extension into the groin region.  Scrotal ultrasound ordered.  If he is a candidate for AUS will defer to Alexander Dixon if this could be addressed at the time of his implant or would need to be repaired prior   Alexander Dixon, [place] [address], [place], [place] [address] [phone]

## 2021-12-10 ENCOUNTER — Other Ambulatory Visit: Payer: Self-pay | Admitting: Family Medicine

## 2021-12-10 LAB — PSA: Prostate Specific Ag, Serum: <0.1 ng/mL (ref 0.0–4.0)

## 2021-12-10 NOTE — Patient Instructions (Signed)
Summary urologic history summary of urologic history testing of urologic history: Of urologic history: Rebiopsy 06/2019; also has a large right hydrocele which he states is bothersome

## 2021-12-11 ENCOUNTER — Encounter: Payer: Self-pay | Admitting: *Deleted

## 2021-12-11 ENCOUNTER — Encounter: Payer: Self-pay | Admitting: Urology

## 2021-12-14 ENCOUNTER — Ambulatory Visit: Payer: Medicare Other | Admitting: Urology

## 2021-12-14 ENCOUNTER — Other Ambulatory Visit: Payer: Self-pay

## 2021-12-14 VITALS — BP 143/72 | HR 61 | Ht 72.0 in | Wt 196.0 lb

## 2021-12-14 DIAGNOSIS — N3946 Mixed incontinence: Secondary | ICD-10-CM

## 2021-12-14 NOTE — Progress Notes (Signed)
12/14/2021 8:29 AM   Alexander Dixon [birth year] [id number]  Referring provider: Lesleigh Noe, MD [place],  Alexander Dixon [id number]  Chief Complaint  Patient presents with   Prostate Cancer    HPI: [place]: Patient has prostate cancer.  He had a robotic prostatectomy a few years ago at [facility]  Has had physical therapy for stress incontinence using clamp and 6-8 pads a day.  He is known to have a right hydrocele  The patient leaks with coughing sneezing bending and activity.  He also has urge incontinence.  He has mild to moderate bedwetting.  He wears 6-8 pads a day moderately wet to soaked.  He voids approximately every hour depending on fluid intake and cannot hold it for 2 hours.  He wears a clamp.  He gets up once or twice a night.  Flow was good.  He is right-handed.  He had a hernia repair on the left 20 years ago and is not certain if mesh was used.  He is on oral hypoglycemics.  He is passed a kidney stone.  No history of urinary tract infections.  Bowel movements normal.  On physical examination he did have a very large tight hydrocele on the right even making the left hemiscrotum elevated and a bit tight.  It did extend up into his inguinal canal.  It likely is a hydrocele and not a hernia.  Importantly he has a scrotal ultrasound ordered.  He has a flat abdomen and a very faint scar left lower abdomen.     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   FVC 71% and FEV1 73% untreated   Diabetes mellitus without complication (HCC)    History of chicken pox    Prostate cancer ([place])    Pure hypercholesterolemia 11/21/2019    Surgical History: Past Surgical History:  Procedure Laterality Date   COLONOSCOPY WITH PROPOFOL N/A 04/21/2020   Procedure: COLONOSCOPY WITH PROPOFOL;  Surgeon: Jonathon Bellows, MD;  Location: [facility] ENDOSCOPY;  Service: Gastroenterology;  Laterality: N/A;  POSITIVE 3/23   CONTINUOUS NERVE MONITORING N/A  01/07/2021   Procedure: FACIAL NERVE MONITORING;  Surgeon: Carloyn Manner, MD;  Location: [place];  Service: ENT;  Laterality: N/A;   EAR CYST EXCISION Right 01/07/2021   Procedure: EXCISION EPIDERMAL INCLUSION CYST;  Surgeon: Carloyn Manner, MD;  Location: [place];  Service: ENT;  Laterality: Right;   HERNIA REPAIR     inguinal   KNEE ARTHROSCOPY Right 1984   PROSTATE SURGERY     due to prostate cancer    Home Medications:  Allergies as of 12/14/2021   No Known Allergies      Medication List        Accurate as of December 14, 2021  8:29 AM. If you have any questions, ask your nurse or doctor.          aspirin EC 81 MG tablet Take 81 mg by mouth daily. Swallow whole.   atorvastatin 10 MG tablet Commonly known as: LIPITOR TAKE 1 TABLET BY MOUTH  DAILY   latanoprost 0.005 % ophthalmic solution Commonly known as: XALATAN Place 1 drop into both eyes at bedtime.   metFORMIN 1000 MG tablet Commonly known as: GLUCOPHAGE TAKE 1 TABLET BY MOUTH  TWICE DAILY   OneTouch Delica Plus [id number] Misc USE TO CHECK BLOOD SUGAR TWICE  DAILY   OneTouch Ultra test strip Generic drug: glucose blood USE TO CHECK BLOOD  SUGAR  TWICE DAILY   [employer] Blood Glucose Meter w/Device Kit Meter choice per insurance.   sildenafil 100 MG tablet Commonly known as: VIAGRA Take 1 tablet (100 mg total) by mouth daily as needed for erectile dysfunction.   tadalafil 5 MG tablet Commonly known as: CIALIS Take by mouth.   Vitamin D3 50 MCG (2000 UT) Tabs Take 2,000 Units by mouth daily.        Allergies: No Known Allergies  Family History: Family History  Problem Relation Age of Onset   Leukemia Mother    Arthritis Mother    Diabetes Mother    Alcohol abuse Father    COPD Father    Arthritis Maternal Grandmother    Diabetes Maternal Grandmother    Hearing loss Maternal Grandmother    Arthritis Maternal Grandfather    Heart attack Maternal Grandfather 52    Diabetes Maternal Grandfather    Heart disease Maternal Grandfather    Hyperlipidemia Maternal Grandfather    Hypertension Maternal Grandfather    Prostate cancer Maternal Grandfather    Prostate cancer Maternal Uncle    Diabetes Maternal Uncle    Prostate cancer Cousin    Diabetes Maternal Uncle     Social History:  reports that he quit smoking about 20 years ago. His smoking use included cigarettes. He has a 30.00 pack-year smoking history. He has never used smokeless tobacco. He reports that he does not currently use alcohol. He reports that he does not use drugs.  ROS:                                        Physical Exam: BP (!) 143/72    Pulse 61    Ht 6' (1.829 m)    Wt 88.9 kg    BMI 26.58 kg/m   Constitutional:  Alert and oriented, No acute distress. HEENT: [place] AT, moist mucus membranes.  Trachea midline, no masses.   Laboratory Data: Lab Results  Component Value Date   WBC 7.7 04/03/2020   HGB 14.1 04/03/2020   HCT 42.6 04/03/2020   MCV 84.0 04/03/2020   PLT 269.0 04/03/2020    Lab Results  Component Value Date   CREATININE 1.01 04/07/2021    Lab Results  Component Value Date   PSA 0.00 (L) 04/07/2021   PSA 0.00 Repeated and verified X2. (L) 04/03/2020   PSA 0.00 (L) 12/27/2019    Lab Results  Component Value Date   TESTOSTERONE 707.39 08/15/2019    Lab Results  Component Value Date   HGBA1C 6.8 (A) 04/07/2021    Urinalysis    Component Value Date/Time   COLORURINE ORANGE BIOCHEMICALS MAY BE AFFECTED BY COLOR (A) 11/02/2009 1623   APPEARANCEUR CLOUDY (A) 11/02/2009 1623   LABSPEC 1.017 11/02/2009 1623   PHURINE 5.5 11/02/2009 1623   GLUCOSEU NEGATIVE 11/02/2009 1623   HGBUR SMALL (A) 11/02/2009 1623   BILIRUBINUR NEGATIVE 11/02/2009 1623   KETONESUR 15 (A) 11/02/2009 1623   PROTEINUR 30 (A) 11/02/2009 1623   UROBILINOGEN 1.0 11/02/2009 1623   NITRITE POSITIVE (A) 11/02/2009 1623   LEUKOCYTESUR LARGE (A) 11/02/2009  1623    Pertinent Imaging:   Assessment & Plan: Pathophysiology of urinary incontinence discussed.  Role of urodynamics discussed.  The patient is an excellent candidate for urodynamics and work-up for an artificial sphincter.  There is no question that his hydroceles should be addressed prior and  should not be done simultaneously with the sphincter.  This would delay his sphincter for many months.  The sphincter could be placed on the left side but depending on how well he heals and local tissue findings it may be likely placed on the right side. We both agreed to wait till he is healing the order of the urodynamics.  When he is doing well postop the urodynamics can be ordered and then he can follow-up with me at a later date.  The scrotum in my opinion should heal for at least 4 months or longer before we do local surgery with an implant  There are no diagnoses linked to this encounter.  No follow-ups on file.  Reece Packer, MD  [place] [address] [place], [place] [address] [phone]

## 2021-12-23 ENCOUNTER — Ambulatory Visit
Admission: RE | Admit: 2021-12-23 | Discharge: 2021-12-23 | Disposition: A | Discharge status: Home / Self Care | Payer: Medicare Other | Source: Ambulatory Visit | Attending: Urology | Admitting: Urology

## 2021-12-23 ENCOUNTER — Other Ambulatory Visit: Payer: Self-pay

## 2021-12-23 DIAGNOSIS — N433 Hydrocele, unspecified: Secondary | ICD-10-CM | POA: Diagnosis present

## 2021-12-31 ENCOUNTER — Telehealth: Payer: Self-pay | Admitting: Family Medicine

## 2022-01-04 ENCOUNTER — Ambulatory Visit: Payer: Medicare Other | Admitting: Family Medicine

## 2022-01-07 ENCOUNTER — Ambulatory Visit: Payer: Medicare Other | Admitting: Family Medicine

## 2022-01-13 ENCOUNTER — Ambulatory Visit: Payer: Medicare Other | Admitting: Urology

## 2022-01-13 ENCOUNTER — Ambulatory Visit: Payer: Medicare Other | Admitting: Family Medicine

## 2022-01-14 ENCOUNTER — Other Ambulatory Visit: Payer: Self-pay

## 2022-01-14 ENCOUNTER — Ambulatory Visit: Payer: Medicare Other | Admitting: Urology

## 2022-01-14 ENCOUNTER — Other Ambulatory Visit: Payer: Self-pay | Admitting: Urology

## 2022-01-14 ENCOUNTER — Encounter: Payer: Self-pay | Admitting: Urology

## 2022-01-14 VITALS — BP 161/71 | HR 80 | Ht 72.0 in | Wt 192.0 lb

## 2022-01-14 DIAGNOSIS — N433 Hydrocele, unspecified: Secondary | ICD-10-CM

## 2022-01-14 NOTE — Progress Notes (Signed)
Surgical Physician Order Form [facility] [place]  * Scheduling expectation :  Patient preference  *Length of Case: 60 minutes  *Clearance needed: no  *Anticoagulation Instructions: N/A  *Aspirin Instructions: N/A  *Post-op visit Date/Instructions:  1-3 day drain removal  *Diagnosis: Hydrocele  *Procedure: right Hydrocele excision groin/unilateral scrotal approach (92426)   Additional orders: N/A  -Admit type: OUTpatient  -Anesthesia: Choice  -VTE Prophylaxis Standing Order SCDs       Other:   -Standing Lab Orders Per Anesthesia    Lab other: None  -Standing Test orders EKG/Chest x-ray per Anesthesia       Test other:   - Medications:  Ancef 2gm IV  -Other orders:  N/A

## 2022-01-14 NOTE — Progress Notes (Signed)
01/14/2022 3:38 PM   Alexander Dixon 1956/01/12 [id number]  Referring provider: Lesleigh Noe, MD Alexander Dixon,  [place] [address]  Chief Complaint  Patient presents with   Hydrocele    HPI: Alexander Dixon is a 66 y.o. male who presents to establish local urologic care.  Summary of urologic history:  TRUS/biopsy September 2010 [facility]; PSA 7.39; benign pathology Rebiopsy 05/2015; PSA 9.37; volume 118 cc; Path focal Gleason 3+3; elected active surveillance MRI 11/2015 no evidence of extracapsular disease; confirmatory biopsy 11/2015 1 core Gleason 3+3 adenocarcinoma; volume 126 cc Rebiopsy 06/2019; 2 cores Gleason 3+4 adenocarcinoma Elected RARP by Dr. Thomos [facility] at [facility]; pT2N0 PSA has remained undetectable and last checked 03/2021 and was 0 Complains of bothersome stress incontinence.  Underwent physical therapy without improvement.  Currently using a Cunningham clamp however when not using a clamp he goes through 6-8 pads per day he is interested in an artificial urinary sphincter Also has a large right hydrocele which he states is bothersome Using PDE 5 inhibitor for ED  Initially seen 12/09/2021 with a large right hydrocele and desire for placement of an artificial urinary sphincter Scrotal ultrasound did show large right hydrocele without evidence of hernia He did see Dr. 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    FVC 71% and FEV1 73% untreated   Diabetes mellitus without complication ([place])    History of chicken pox    Prostate cancer ([place])    Pure hypercholesterolemia 11/21/2019    Surgical History: Past Surgical History:  Procedure Laterality Date   COLONOSCOPY WITH PROPOFOL N/A 04/21/2020   Procedure: COLONOSCOPY WITH PROPOFOL;  Surgeon: Alexander Bellows, MD;  [facility];  Service: Gastroenterology;  Laterality: N/A;  POSITIVE 3/23   CONTINUOUS NERVE MONITORING N/A 01/07/2021   Procedure: FACIAL NERVE MONITORING;  Surgeon: Alexander Manner, MD;  Location: [facility];  Service: ENT;  Laterality: N/A;   EAR CYST EXCISION Right 01/07/2021   Procedure: EXCISION EPIDERMAL INCLUSION CYST;  Surgeon: Alexander Manner, MD;  Location: [facility];  Service: ENT;  Laterality: Right;   HERNIA REPAIR     inguinal   KNEE ARTHROSCOPY Right 1984   PROSTATE SURGERY     due to prostate cancer    Home Medications:  Allergies as of 01/14/2022   No Known Allergies      Medication List        Accurate as of January 14, 2022  3:38 PM. If you have any questions, ask your nurse or doctor.          aspirin EC 81 MG tablet Take 81 mg by mouth daily. Swallow whole.   atorvastatin 10 MG tablet Commonly known as: LIPITOR TAKE 1 TABLET BY MOUTH  DAILY   latanoprost 0.005 % ophthalmic solution Commonly known as: XALATAN Place 1 drop into both eyes at bedtime.   metFORMIN 1000 MG tablet Commonly known as: GLUCOPHAGE TAKE 1 TABLET BY MOUTH  TWICE DAILY   OneTouch Delica Plus [id number] Misc USE TO CHECK BLOOD SUGAR TWICE  DAILY   OneTouch Ultra test strip Generic drug: glucose blood USE TO CHECK BLOOD SUGAR  TWICE DAILY   OptumRx Blood Glucose Meter w/Device Kit Meter choice per insurance.   sildenafil 100 MG tablet Commonly known as: VIAGRA Take 1 tablet (100 mg total) by mouth daily as needed for erectile dysfunction.   tadalafil 5 MG tablet Commonly known as:  CIALIS Take by mouth.   Vitamin D3 50 MCG (2000 UT) Tabs Take 2,000 Units by mouth daily.        Allergies: No Known Allergies  Family History: Family History  Problem Relation Age of Onset   Leukemia Mother    Arthritis Mother    Diabetes Mother    Alcohol abuse Father    COPD Father    Arthritis Maternal Grandmother    Diabetes Maternal Grandmother    Hearing loss Maternal Grandmother    Arthritis Maternal Grandfather    Heart attack Maternal Grandfather 52   Diabetes Maternal Grandfather    Heart disease Maternal Grandfather    Hyperlipidemia Maternal Grandfather    Hypertension Maternal Grandfather    Prostate cancer Maternal Grandfather    Prostate cancer Maternal Uncle    Diabetes Maternal Uncle    Prostate cancer Cousin    Diabetes Maternal Uncle     Social History:  reports that he quit smoking about 21 years ago. His smoking use included cigarettes. He has a 30.00 pack-year smoking history. He has never used smokeless tobacco. He reports that he does not currently use alcohol. He reports that he does not use drugs.   Physical Exam: BP (!) 161/71    Pulse 80    Ht 6' (1.829 m)    Wt 192 lb (87.1 kg)    BMI 26.04 kg/m   Constitutional:  Alert and oriented, No acute distress. HEENT: [place] AT, moist mucus membranes.  Trachea midline, no masses. Cardiovascular: No clubbing, cyanosis, or edema. Respiratory: Normal respiratory effort, no increased work of breathing. Psychiatric: Normal mood and affect.   Assessment & Plan:    pT2 Gleason 3+4 adenocarcinoma prostate 2 years status post radical prostatectomy with undetectable PSA PSA 12/09/2021 undetectable <0.1  2.  Post prostatectomy incontinence As above  3.  Right hydrocele Desires to schedule right hydrocelectomy.  The procedure was cussed in detail including potential risks of bleeding/hematoma, infection/abscess; either which could require reexploration.  Possibility of recurrent hydrocele was discussed  though the incidence quite low.  We discussed the likelihood of significant postoperative swelling and the need for a drain for 2-3 days post procedure.  All questions were answered and he desires to schedule.   Alexander Dixon, [facility] [address], [place], [place] [address] [phone]

## 2022-01-15 ENCOUNTER — Telehealth: Payer: Self-pay

## 2022-01-15 NOTE — Progress Notes (Signed)
[facility] Posting Form   Surgery Date/Time: Date: 02/09/2022  Surgeon: Dr. John Giovanni, MD  Surgery Location: Day Surgery  Inpt ( No  )   Outpt (Yes)   Obs ( No  )   Diagnosis: N43.3 Right Hydrocele  -CPT: 57846  Surgery: Right Hydrocelectomy using unilateral scrotal approach.   Stop Anticoagulations: N/A  Cardiac/Medical/Pulmonary Clearance needed: no  *Orders entered into EPIC  Date: 01/15/22   *Case booked in EPIC  Date: 01/15/22  *Notified pt of Surgery: Date: 01/15/22  PRE-OP UA & CX: no  *Placed into Prior Authorization Work Que Date: 01/15/2022   Assistant/laser/rep:No

## 2022-01-15 NOTE — Telephone Encounter (Signed)
I spoke with Mr. Nine. We have discussed possible surgery dates and Tuesday March 14th, 2023 was agreed upon by all parties. Patient given information about surgery date, what to expect pre-operatively and post operatively.   We discussed that a Pre-Admission Testing office will be calling to set up the pre-op visit that will take place prior to surgery, and that these appointments are typically done over the phone with a Pre-Admissions RN.   Informed patient that our office will communicate any additional care to be provided after surgery. Patients questions or concerns were discussed during our call. Advised to call our office should there be any additional information, questions or concerns that arise. Patient verbalized understanding.

## 2022-01-21 ENCOUNTER — Ambulatory Visit (INDEPENDENT_AMBULATORY_CARE_PROVIDER_SITE_OTHER): Payer: Medicare Other | Admitting: Family Medicine

## 2022-01-21 ENCOUNTER — Other Ambulatory Visit: Payer: Self-pay

## 2022-01-21 VITALS — BP 100/60 | HR 64 | Temp 98.0°F | Wt 193.2 lb

## 2022-01-21 DIAGNOSIS — I10 Essential (primary) hypertension: Secondary | ICD-10-CM | POA: Diagnosis not present

## 2022-01-21 DIAGNOSIS — Z8546 Personal history of malignant neoplasm of prostate: Secondary | ICD-10-CM | POA: Diagnosis not present

## 2022-01-21 DIAGNOSIS — E119 Type 2 diabetes mellitus without complications: Secondary | ICD-10-CM

## 2022-01-21 DIAGNOSIS — J449 Chronic obstructive pulmonary disease, unspecified: Secondary | ICD-10-CM

## 2022-01-21 DIAGNOSIS — E1169 Type 2 diabetes mellitus with other specified complication: Secondary | ICD-10-CM | POA: Insufficient documentation

## 2022-01-21 DIAGNOSIS — E785 Hyperlipidemia, unspecified: Secondary | ICD-10-CM | POA: Diagnosis not present

## 2022-01-21 LAB — LIPID PANEL
Cholesterol: 144 mg/dL (ref 0–200)
HDL: 42.3 mg/dL (ref >39.00)
LDL Cholesterol: 86 mg/dL (ref 0–99)
NonHDL: 101.22
Total CHOL/HDL Ratio: 3
Triglycerides: 75 mg/dL (ref 0.0–149.0)
VLDL: 15 mg/dL (ref 0.0–40.0)

## 2022-01-21 LAB — COMPREHENSIVE METABOLIC PANEL
ALT: 16 U/L (ref 0–53)
AST: 16 U/L (ref 0–37)
Albumin: 4.5 g/dL (ref 3.5–5.2)
Alkaline Phosphatase: 56 U/L (ref 39–117)
BUN: 24 mg/dL — ABNORMAL HIGH (ref 6–23)
CO2: 35 mEq/L — ABNORMAL HIGH (ref 19–32)
Calcium: 9.9 mg/dL (ref 8.4–10.5)
Chloride: 102 mEq/L (ref 96–112)
Creatinine, Ser: 0.93 mg/dL (ref 0.40–1.50)
GFR: 85.18 mL/min (ref >60.00)
Glucose, Bld: 134 mg/dL — ABNORMAL HIGH (ref 70–99)
Potassium: 4.9 mEq/L (ref 3.5–5.1)
Sodium: 140 mEq/L (ref 135–145)
Total Bilirubin: 0.5 mg/dL (ref 0.2–1.2)
Total Protein: 7 g/dL (ref 6.0–8.3)

## 2022-01-21 LAB — PSA, MEDICARE: PSA: 0 ng/ml — ABNORMAL LOW (ref 0.10–4.00)

## 2022-01-21 LAB — POCT GLYCOSYLATED HEMOGLOBIN (HGB A1C): Hemoglobin A1C: 7.1 % — AB (ref 4.0–5.6)

## 2022-01-21 MED ORDER — METFORMIN HCL 1000 MG PO TABS: 1000.0000 mg | ORAL_TABLET | Freq: Two times a day (BID) | ORAL | 3 refills | Status: DC

## 2022-01-21 NOTE — Assessment & Plan Note (Signed)
Lab Results  Component Value Date   CHOL 142 04/07/2021   HDL 42.60 04/07/2021   LDLCALC 81 04/07/2021   TRIG 93.0 04/07/2021   CHOLHDL 3 04/07/2021  Repeat cholesterol today.  Continue atorvastatin 10 mg.  If LDL still above 70 we will discuss increasing statin.

## 2022-01-21 NOTE — Progress Notes (Signed)
Subjective:     Alexander Dixon is a 66 y.o. male presenting for Follow-up (DM )     HPI  #Diabetes Currently taking metformin  Using medications without difficulties: No Hypoglycemic episodes:No  Hyperglycemic episodes:No  Feet problems:No  Blood Sugars averaging: was high in December, improved now Last HgbA1c:  Lab Results  Component Value Date   HGBA1C 7.1 (A) 01/21/2022    Diabetes Health Maintenance Due:    Diabetes Health Maintenance Due  Topic Date Due   OPHTHALMOLOGY EXAM  03/27/2022   FOOT EXAM  04/07/2022   URINE MICROALBUMIN  04/07/2022   HEMOGLOBIN A1C  07/21/2022   Was higher when he wasn't exercising   Review of Systems   Social History   Tobacco Use  Smoking Status Former   Packs/day: 1.00   Years: 30.00   Pack years: 30.00   Types: Cigarettes   Quit date: 12/2000   Years since quitting: 21.0  Smokeless Tobacco Never        Objective:    BP Readings from Last 3 Encounters:  01/21/22 100/60  01/14/22 (!) 161/71  12/14/21 (!) 143/72   Wt Readings from Last 3 Encounters:  01/21/22 193 lb 4 oz (87.7 kg)  01/14/22 192 lb (87.1 kg)  12/14/21 196 lb (88.9 kg)    BP 100/60    Pulse 64    Temp 98 F (36.7 C) (Oral)    Wt 193 lb 4 oz (87.7 kg)    SpO2 93%    BMI 26.21 kg/m    Physical Exam Constitutional:      Appearance: Normal appearance. He is not ill-appearing or diaphoretic.  HENT:     Right Ear: External ear normal.     Left Ear: External ear normal.  Eyes:     General: No scleral icterus.    Extraocular Movements: Extraocular movements intact.     Conjunctiva/sclera: Conjunctivae normal.  Cardiovascular:     Rate and Rhythm: Normal rate and regular rhythm.  Pulmonary:     Effort: Pulmonary effort is normal. No respiratory distress.     Breath sounds: Normal breath sounds. No wheezing.  Musculoskeletal:     Cervical back: Neck supple.  Skin:    General: Skin is warm and dry.  Neurological:     Mental Status:  He is alert. Mental status is at baseline.  Psychiatric:        Mood and Affect: Mood normal.          Assessment & Plan:   Problem List Items Addressed This Visit       Cardiovascular and Mediastinum   Benign essential HTN    Blood pressure at goal without medication.      Relevant Orders   Comprehensive metabolic panel     Respiratory   COPD, mild ([place])     Endocrine   Type 2 diabetes mellitus without complication, without long-term current use of insulin ([place]) - Primary    Lab Results  Component Value Date   HGBA1C 7.1 (A) 01/21/2022  Well-controlled on metformin 1000mg  twice daily.  Encouraged to continue diet and regular exercise.  Continue statin.       Relevant Medications   metFORMIN (GLUCOPHAGE) 1000 MG tablet   Other Relevant Orders   POCT glycosylated hemoglobin (Hb A1C) (Completed)   Hyperlipidemia associated with type 2 diabetes mellitus ([place])    Lab Results  Component Value Date   CHOL 142 04/07/2021   HDL 42.60 04/07/2021   LDLCALC 81  04/07/2021   TRIG 93.0 04/07/2021   CHOLHDL 3 04/07/2021  Repeat cholesterol today.  Continue atorvastatin 10 mg.  If LDL still above 70 we will discuss increasing statin.       Relevant Medications   metFORMIN (GLUCOPHAGE) 1000 MG tablet   Other Relevant Orders   Lipid panel     Other   History of prostate cancer    Annual PSA done today.  Completed treatment.      Relevant Orders   PSA, Medicare     Return in about 6 months (around 07/21/2022) for wellness visit - health nurse .  Lesleigh Noe, MD  This visit occurred during the SARS-CoV-2 public health emergency.  Safety protocols were in place, including screening questions prior to the visit, additional usage of staff PPE, and extensive cleaning of exam room while observing appropriate contact time as indicated for disinfecting solutions.

## 2022-01-21 NOTE — Assessment & Plan Note (Signed)
Annual PSA done today.  Completed treatment.

## 2022-01-21 NOTE — Assessment & Plan Note (Signed)
Blood pressure at goal without medication.

## 2022-01-21 NOTE — Assessment & Plan Note (Signed)
Lab Results  Component Value Date   HGBA1C 7.1 (A) 01/21/2022  Well-controlled on metformin 1000mg  twice daily.  Encouraged to continue diet and regular exercise.  Continue statin.

## 2022-02-01 ENCOUNTER — Ambulatory Visit: Payer: Medicare Other

## 2022-02-05 ENCOUNTER — Encounter
Admission: RE | Admit: 2022-02-05 | Discharge: 2022-02-05 | Disposition: A | Discharge status: Home / Self Care | Payer: Medicare Other | Source: Ambulatory Visit | Attending: Urology | Admitting: Urology

## 2022-02-05 ENCOUNTER — Other Ambulatory Visit: Payer: Self-pay

## 2022-02-05 ENCOUNTER — Encounter: Payer: Self-pay | Admitting: Urgent Care

## 2022-02-05 VITALS — Ht 72.0 in | Wt 190.0 lb

## 2022-02-05 DIAGNOSIS — Z01812 Encounter for preprocedural laboratory examination: Secondary | ICD-10-CM | POA: Diagnosis not present

## 2022-02-05 DIAGNOSIS — Z01810 Encounter for preprocedural cardiovascular examination: Secondary | ICD-10-CM | POA: Diagnosis not present

## 2022-02-05 DIAGNOSIS — J449 Chronic obstructive pulmonary disease, unspecified: Secondary | ICD-10-CM | POA: Insufficient documentation

## 2022-02-05 HISTORY — DX: Unspecified osteoarthritis, unspecified site: M19.90

## 2022-02-05 LAB — CBC
HCT: 42.7 % (ref 39.0–52.0)
Hemoglobin: 13.6 g/dL (ref 13.0–17.0)
MCH: 27 pg (ref 26.0–34.0)
MCHC: 31.9 g/dL (ref 30.0–36.0)
MCV: 84.7 fL (ref 80.0–100.0)
Platelets: 280 10*3/uL (ref 150–400)
RBC: 5.04 MIL/uL (ref 4.22–5.81)
RDW: 13.8 % (ref 11.5–15.5)
WBC: 6.9 10*3/uL (ref 4.0–10.5)
nRBC: 0 % (ref 0.0–0.2)

## 2022-02-05 NOTE — Patient Instructions (Addendum)
Your procedure is scheduled on: Tuesday 02/09/22 ?Report to the Registration Desk on the 1st floor of the [facility]. ?To find out your arrival time, please call [phone] between 1PM - 3PM on: Monday 02/08/22 ? ?REMEMBER: ?Instructions that are not followed completely may result in serious medical risk, up to and including death; or upon the discretion of your surgeon and anesthesiologist your surgery may need to be rescheduled. ? ?Do not eat or drink after midnight the night before surgery.  ?No gum chewing, lozengers or hard candies. ? ?TAKE THESE MEDICATIONS THE MORNING OF SURGERY WITH A SIP OF WATER: ?atorvastatin (LIPITOR) 10 MG tablet ? ?Stop taking your Metform 2 days prior to surgery. Take last dose Saturday 02/06/22. ? ?One week prior to surgery: ?Stop taking your Cholecalciferol (VITAMIN D3) 50 MCG (2000 UT) TABS, Ibuprofen-diphenhydrAMINE HCl (ADVIL PM) 200-25 MG CAPS, KRILL OIL PO, Multiple Vitamins-Minerals (HAIR/SKIN/NAILS) CAPS, celsius live fit vitamin drink, TURMERIC PO and Anti-inflammatories (NSAIDS) such as Advil, Aleve, Ibuprofen, Motrin, Naproxen, Naprosyn and Aspirin based products such as Excedrin, Goodys Powder, BC Powder. ?Stop taking your KRILL OIL PO, Multiple Vitamins-Minerals (HAIR/SKIN/NAILS) CAPS, celsius live fit vitamin drink, TURMERIC PO ANY OVER THE COUNTER supplements until after surgery. ?You may however, continue to take Tylenol if needed for pain up until the day of surgery. ? ?No Alcohol for 24 hours before or after surgery. ? ?No Smoking including e-cigarettes for 24 hours prior to surgery.  ?No chewable tobacco products for at least 6 hours prior to surgery.  ?No nicotine patches on the day of surgery. ? ?Do not use any "recreational" drugs for at least a week prior to your surgery.  ?Please be advised that the combination of cocaine and anesthesia may have negative outcomes, up to and including death. ?If you test positive for cocaine, your surgery will be  cancelled. ? ?On the morning of surgery brush your teeth with toothpaste and water, you may rinse your mouth with mouthwash if you wish. ?Do not swallow any toothpaste or mouthwash. ? ?Do not wear jewelry. ? ?Do not wear lotions, powders, or colognes.  ? ?Do not shave body from the neck down 48 hours prior to surgery just in case you cut yourself which could leave a site for infection.  ?Also, freshly shaved skin may become irritated if using the CHG soap. ? ?Do not bring valuables to the hospital. [facility] is not responsible for any missing/lost belongings or valuables.  ? ?Notify your doctor if there is any change in your medical condition (cold, fever, infection). ? ?Wear comfortable clothing (specific to your surgery type) to the hospital. ? ?If you are being discharged the day of surgery, you will not be allowed to drive home. ?You will need a responsible adult (18 years or older) to drive you home and stay with you that night.  ? ?If you are taking public transportation, you will need to have a responsible adult (18 years or older) with you. ?Please confirm with your physician that it is acceptable to use public transportation.  ? ?Please call the [facility] at [phone] if you have any questions about these instructions. ? ?Surgery Visitation Policy: ? ?Patients undergoing a surgery or procedure may have one family member or support person with them as long as that person is not COVID-19 positive or experiencing its symptoms.  ?That person may remain in the waiting area during the procedure and may rotate out with other people. ? ?Inpatient Visitation:   ? ?  Visiting hours are 7 a.m. to 8 p.m. ?Up to two visitors ages 16+ are allowed at one time in a patient room. The visitors may rotate out with other people during the day. Visitors must check out when they leave, or other visitors will not be allowed. One designated support person may remain overnight. ?The visitor must pass  COVID-19 screenings, use hand sanitizer when entering and exiting the patient?s room and wear a mask at all times, including in the patient?s room. ?Patients must also wear a mask when staff or their visitor are in the room. ?Masking is required regardless of vaccination status.  ?

## 2022-02-08 MED ORDER — FAMOTIDINE 20 MG PO TABS: 20.0000 mg | ORAL_TABLET | Freq: Once | ORAL | Status: AC

## 2022-02-08 MED ORDER — SODIUM CHLORIDE 0.9 % IV SOLN
INTRAVENOUS | Status: DC
Start: 2022-02-08 — End: 2022-02-09

## 2022-02-08 MED ORDER — ORAL CARE MOUTH RINSE: 15.0000 mL | Freq: Once | OROMUCOSAL | Status: AC

## 2022-02-08 MED ORDER — CEFAZOLIN SODIUM-DEXTROSE 2-4 GM/100ML-% IV SOLN
2.0000 g | INTRAVENOUS | Status: AC
  Administered 2022-02-09: 2 g via INTRAVENOUS

## 2022-02-08 MED ORDER — CHLORHEXIDINE GLUCONATE 0.12 % MT SOLN: 15.0000 mL | Freq: Once | OROMUCOSAL | Status: AC

## 2022-02-09 ENCOUNTER — Other Ambulatory Visit: Payer: Self-pay

## 2022-02-09 ENCOUNTER — Ambulatory Visit
Admission: RE | Admit: 2022-02-09 | Discharge: 2022-02-09 | Disposition: A | Discharge status: Home / Self Care | Payer: Medicare Other | Attending: Urology | Admitting: Urology

## 2022-02-09 ENCOUNTER — Encounter
Admission: RE | Disposition: A | Discharge status: Home / Self Care | Payer: Self-pay | Source: Home / Self Care | Attending: Urology

## 2022-02-09 ENCOUNTER — Ambulatory Visit: Payer: Medicare Other | Admitting: Anesthesiology

## 2022-02-09 ENCOUNTER — Encounter: Payer: Self-pay | Admitting: Urology

## 2022-02-09 DIAGNOSIS — J449 Chronic obstructive pulmonary disease, unspecified: Secondary | ICD-10-CM | POA: Diagnosis not present

## 2022-02-09 DIAGNOSIS — Z8546 Personal history of malignant neoplasm of prostate: Secondary | ICD-10-CM | POA: Diagnosis not present

## 2022-02-09 DIAGNOSIS — N393 Stress incontinence (female) (male): Secondary | ICD-10-CM | POA: Insufficient documentation

## 2022-02-09 DIAGNOSIS — Z9079 Acquired absence of other genital organ(s): Secondary | ICD-10-CM | POA: Diagnosis not present

## 2022-02-09 DIAGNOSIS — E119 Type 2 diabetes mellitus without complications: Secondary | ICD-10-CM | POA: Insufficient documentation

## 2022-02-09 DIAGNOSIS — N433 Hydrocele, unspecified: Secondary | ICD-10-CM

## 2022-02-09 DIAGNOSIS — E7800 Pure hypercholesterolemia, unspecified: Secondary | ICD-10-CM | POA: Diagnosis not present

## 2022-02-09 DIAGNOSIS — I10 Essential (primary) hypertension: Secondary | ICD-10-CM | POA: Diagnosis not present

## 2022-02-09 DIAGNOSIS — Z87891 Personal history of nicotine dependence: Secondary | ICD-10-CM | POA: Diagnosis not present

## 2022-02-09 DIAGNOSIS — I2510 Atherosclerotic heart disease of native coronary artery without angina pectoris: Secondary | ICD-10-CM | POA: Insufficient documentation

## 2022-02-09 LAB — GLUCOSE, CAPILLARY
Glucose-Capillary: 133 mg/dL — ABNORMAL HIGH (ref 70–99)
Glucose-Capillary: 145 mg/dL — ABNORMAL HIGH (ref 70–99)

## 2022-02-09 SURGERY — HYDROCELECTOMY
Anesthesia: General | Site: Scrotum | Laterality: Right

## 2022-02-09 MED ORDER — DEXAMETHASONE SODIUM PHOSPHATE 10 MG/ML IJ SOLN
INTRAMUSCULAR | Status: AC
  Filled 2022-02-09: qty 1

## 2022-02-09 MED ORDER — ACETAMINOPHEN 10 MG/ML IV SOLN: 1000.0000 mg | Freq: Once | INTRAVENOUS | Status: DC | PRN

## 2022-02-09 MED ORDER — ACETAMINOPHEN 10 MG/ML IV SOLN
INTRAVENOUS | Status: DC | PRN
  Administered 2022-02-09: 1000 mg via INTRAVENOUS

## 2022-02-09 MED ORDER — EPHEDRINE SULFATE (PRESSORS) 50 MG/ML IJ SOLN
INTRAMUSCULAR | Status: DC | PRN
  Administered 2022-02-09 (×4): 5 mg via INTRAVENOUS

## 2022-02-09 MED ORDER — FAMOTIDINE 20 MG PO TABS
ORAL_TABLET | ORAL | Status: AC
  Administered 2022-02-09: 20 mg via ORAL
  Filled 2022-02-09: qty 1

## 2022-02-09 MED ORDER — PROPOFOL 10 MG/ML IV BOLUS
INTRAVENOUS | Status: DC | PRN
  Administered 2022-02-09: 180 mg via INTRAVENOUS

## 2022-02-09 MED ORDER — BACITRACIN ZINC 500 UNIT/GM EX OINT
TOPICAL_OINTMENT | CUTANEOUS | Status: AC
  Filled 2022-02-09: qty 28.35

## 2022-02-09 MED ORDER — BUPIVACAINE HCL 0.5 % IJ SOLN
INTRAMUSCULAR | Status: DC | PRN
  Administered 2022-02-09: 7 mL

## 2022-02-09 MED ORDER — BUPIVACAINE HCL (PF) 0.5 % IJ SOLN
INTRAMUSCULAR | Status: AC
  Filled 2022-02-09: qty 30

## 2022-02-09 MED ORDER — ONDANSETRON HCL 4 MG/2ML IJ SOLN: 4.0000 mg | Freq: Once | INTRAMUSCULAR | Status: DC | PRN

## 2022-02-09 MED ORDER — FENTANYL CITRATE (PF) 100 MCG/2ML IJ SOLN
INTRAMUSCULAR | Status: DC | PRN
  Administered 2022-02-09 (×4): 25 ug via INTRAVENOUS

## 2022-02-09 MED ORDER — EPHEDRINE 5 MG/ML INJ
INTRAVENOUS | Status: AC
  Filled 2022-02-09: qty 5

## 2022-02-09 MED ORDER — LIDOCAINE HCL (CARDIAC) PF 100 MG/5ML IV SOSY
PREFILLED_SYRINGE | INTRAVENOUS | Status: DC | PRN
  Administered 2022-02-09: 100 mg via INTRAVENOUS

## 2022-02-09 MED ORDER — CEFAZOLIN SODIUM-DEXTROSE 2-4 GM/100ML-% IV SOLN
INTRAVENOUS | Status: AC
  Filled 2022-02-09: qty 100

## 2022-02-09 MED ORDER — SULFAMETHOXAZOLE-TRIMETHOPRIM 800-160 MG PO TABS: 1.0000 | ORAL_TABLET | Freq: Two times a day (BID) | ORAL | 0 refills | Status: AC

## 2022-02-09 MED ORDER — 0.9 % SODIUM CHLORIDE (POUR BTL) OPTIME
TOPICAL | Status: DC | PRN
  Administered 2022-02-09: 200 mL

## 2022-02-09 MED ORDER — ONDANSETRON HCL 4 MG/2ML IJ SOLN
INTRAMUSCULAR | Status: DC | PRN
  Administered 2022-02-09: 4 mg via INTRAVENOUS

## 2022-02-09 MED ORDER — OXYCODONE HCL 5 MG/5ML PO SOLN: 5.0000 mg | Freq: Once | ORAL | Status: AC | PRN

## 2022-02-09 MED ORDER — FENTANYL CITRATE (PF) 100 MCG/2ML IJ SOLN
INTRAMUSCULAR | Status: AC
  Filled 2022-02-09: qty 2

## 2022-02-09 MED ORDER — ONDANSETRON HCL 4 MG/2ML IJ SOLN
INTRAMUSCULAR | Status: AC
  Filled 2022-02-09: qty 2

## 2022-02-09 MED ORDER — OXYCODONE HCL 5 MG PO TABS
5.0000 mg | ORAL_TABLET | Freq: Once | ORAL | Status: AC | PRN
  Administered 2022-02-09: 5 mg via ORAL

## 2022-02-09 MED ORDER — FENTANYL CITRATE (PF) 100 MCG/2ML IJ SOLN: 25.0000 ug | INTRAMUSCULAR | Status: DC | PRN

## 2022-02-09 MED ORDER — OXYCODONE HCL 5 MG PO TABS
ORAL_TABLET | ORAL | Status: AC
  Filled 2022-02-09: qty 1

## 2022-02-09 MED ORDER — ACETAMINOPHEN 10 MG/ML IV SOLN
INTRAVENOUS | Status: AC
  Filled 2022-02-09: qty 100

## 2022-02-09 MED ORDER — HYDROCODONE-ACETAMINOPHEN 7.5-325 MG PO TABS: 1.0000 | ORAL_TABLET | Freq: Four times a day (QID) | ORAL | 0 refills | Status: DC | PRN

## 2022-02-09 MED ORDER — BACITRACIN 500 UNIT/GM EX OINT
TOPICAL_OINTMENT | CUTANEOUS | Status: DC | PRN
  Administered 2022-02-09: 1 via TOPICAL

## 2022-02-09 MED ORDER — MIDAZOLAM HCL 2 MG/2ML IJ SOLN
INTRAMUSCULAR | Status: AC
  Filled 2022-02-09: qty 2

## 2022-02-09 MED ORDER — CHLORHEXIDINE GLUCONATE 0.12 % MT SOLN
OROMUCOSAL | Status: AC
  Administered 2022-02-09: 15 mL via OROMUCOSAL
  Filled 2022-02-09: qty 15

## 2022-02-09 MED ORDER — DEXAMETHASONE SODIUM PHOSPHATE 10 MG/ML IJ SOLN
INTRAMUSCULAR | Status: DC | PRN
Start: 2022-02-09 — End: 2022-02-09
  Administered 2022-02-09: 4 mg via INTRAVENOUS

## 2022-02-09 MED ORDER — MIDAZOLAM HCL 2 MG/2ML IJ SOLN
INTRAMUSCULAR | Status: DC | PRN
  Administered 2022-02-09: 2 mg via INTRAVENOUS

## 2022-02-09 SURGICAL SUPPLY — 37 items
BLADE CLIPPER SURG (BLADE) ×2 IMPLANT
BLADE SURG 15 STRL LF DISP TIS (BLADE) ×1 IMPLANT
BLADE SURG 15 STRL SS (BLADE) ×2
CATH FOLEY 2WAY  5CC 16FR (CATHETERS) ×2
CATH URTH 16FR FL 2W BLN LF (CATHETERS) IMPLANT
CHLORAPREP W/TINT 26 (MISCELLANEOUS) ×2 IMPLANT
DRAIN PENROSE 12X.25 LTX STRL (MISCELLANEOUS) ×1 IMPLANT
DRAPE LAPAROTOMY 77X122 PED (DRAPES) ×2 IMPLANT
DRSG GAUZE FLUFF 36X18 (GAUZE/BANDAGES/DRESSINGS) ×2 IMPLANT
ELECT CAUTERY BLADE 6.4 (BLADE) IMPLANT
ELECT REM PT RETURN 9FT ADLT (ELECTROSURGICAL) ×2
ELECTRODE REM PT RTRN 9FT ADLT (ELECTROSURGICAL) ×1 IMPLANT
GAUZE 4X4 16PLY ~~LOC~~+RFID DBL (SPONGE) ×2 IMPLANT
GAUZE SPONGE 4X4 12PLY STRL (GAUZE/BANDAGES/DRESSINGS) IMPLANT
GLOVE SURG UNDER POLY LF SZ7.5 (GLOVE) ×2 IMPLANT
GOWN STRL REUS W/ TWL LRG LVL3 (GOWN DISPOSABLE) ×1 IMPLANT
GOWN STRL REUS W/TWL LRG LVL3 (GOWN DISPOSABLE) ×2
GOWN STRL REUS W/TWL XL LVL4 (GOWN DISPOSABLE) ×2 IMPLANT
KIT TURNOVER KIT A (KITS) ×2 IMPLANT
LABEL OR SOLS (LABEL) ×2 IMPLANT
MANIFOLD NEPTUNE II (INSTRUMENTS) ×2 IMPLANT
NDL HYPO 25X1 1.5 SAFETY (NEEDLE) ×1 IMPLANT
NEEDLE HYPO 25X1 1.5 SAFETY (NEEDLE) ×2 IMPLANT
NS IRRIG 500ML POUR BTL (IV SOLUTION) ×2 IMPLANT
PACK BASIN MINOR ARMC (MISCELLANEOUS) ×2 IMPLANT
SUPPORETR ATHLETIC LG (MISCELLANEOUS) ×1 IMPLANT
SUPPORTER ATHLETIC LG (MISCELLANEOUS) ×2
SUT CHROMIC 3 0 PS 2 (SUTURE) ×1 IMPLANT
SUT CHROMIC 3 0 SH 27 (SUTURE) ×2 IMPLANT
SUT ETHILON 3-0 FS-10 30 BLK (SUTURE) ×2
SUT ETHILON NAB PS2 4-0 18IN (SUTURE) IMPLANT
SUT MNCRL 3-0 UNDYED SH (SUTURE) ×1 IMPLANT
SUT MONOCRYL 3-0 UNDYED (SUTURE) ×2
SUTURE EHLN 3-0 FS-10 30 BLK (SUTURE) IMPLANT
SYR 10ML LL (SYRINGE) ×2 IMPLANT
SYR BULB IRRIG 60ML STRL (SYRINGE) ×2 IMPLANT
WATER STERILE IRR 500ML POUR (IV SOLUTION) ×2 IMPLANT

## 2022-02-09 NOTE — Discharge Instructions (Addendum)
AMBULATORY SURGERY  ?DISCHARGE INSTRUCTIONS ? ? ?The drugs that you were given will stay in your system until tomorrow so for the next 24 hours you should not: ? ?Drive an automobile ?Make any legal decisions ?Drink any alcoholic beverage ? ? ?You may resume regular meals tomorrow.  Today it is better to start with liquids and gradually work up to solid foods. ? ?You may eat anything you prefer, but it is better to start with liquids, then soup and crackers, and gradually work up to solid foods. ? ? ?Please notify your doctor immediately if you have any unusual bleeding, trouble breathing, redness and pain at the surgery site, drainage, fever, or pain not relieved by medication. ? ?  ? ?Your post-operative visit with Dr.                     ? ? ?           ?     is: Date:                        Time:   ? ?Please call to schedule your post-operative visit. ? ?Additional Instructions:  ? ?Discharge instructions following scrotal surgery ? ?Call your doctor for: ?Fever is greater than 100.5 ?Severe nausea or vomiting ?Increasing pain not controlled by pain medication ?Increasing redness or drainage from incisions ? ?The number for questions or concerns is [phone] ? ?Medications: Resume prior medications.  A prescription for antibiotic and pain medication was sent to your pharmacy ? ?Activity level: ?No lifting greater than 10 pounds (about equal to gallon of milk) for the next 2 weeks or until cleared to do so at follow-up appointment.  Otherwise activity as tolerated by comfort level. ? ?Diet: ?May resume your regular diet as tolerated ? ?Driving: ?No driving while still taking opiate pain medications (weight at least 6-8 hours after last dose).  No driving if you still sore from surgery as it may limit her ability to react quickly if necessary. ? ? ?Shower/bath: ?May shower 48 hours after surgery.  No tub bath, hot tub or swimming for 10 days. ? ?Wound care: ?He may cover wounds with sterile gauze as needed to  prevent incisions rubbing on close follow-up in any seepage.  ?You have a scrotal drain in place and replace as needed.  Wear supportive underwear for at least 2 weeks.  You should apply cold compresses (ice or sac of frozen peas/corn) to your scrotum for at least 48 hours to reduce the swelling.  You should expect that his scrotum will swell up initially and then get smaller over the next 2-4 weeks. ? ?Follow-up appointments: ?Follow-up appointment 02/12/2022 for drain removal ? ?

## 2022-02-09 NOTE — Anesthesia Postprocedure Evaluation (Signed)
Anesthesia Post Note ? ?Patient: JOASH TONY ? ?Procedure(s) Performed: HYDROCELECTOMY ADULT (Right: Scrotum) ? ?Patient location during evaluation: PACU ?Anesthesia Type: General ?Level of consciousness: awake and alert ?Pain management: pain level controlled ?Vital Signs Assessment: post-procedure vital signs reviewed and stable ?Respiratory status: spontaneous breathing, nonlabored ventilation, respiratory function stable and patient connected to nasal cannula oxygen ?Cardiovascular status: blood pressure returned to baseline and stable ?Postop Assessment: no apparent nausea or vomiting ?Anesthetic complications: no ? ? ?No notable events documented. ? ? ?Last Vitals:  ?Vitals:  ? 02/09/22 1500 02/09/22 1524  ?BP: (!) 173/75 (!) 165/72  ?Pulse: 74 74  ?Resp: 15 14  ?Temp: (!) 36.1 ?C (!) 36.1 ?C  ?SpO2: 98% 99%  ?  ?Last Pain:  ?Vitals:  ? 02/09/22 1524  ?TempSrc: Temporal  ?PainSc: 2   ? ? ?  ?  ?  ?  ?  ?  ? ?Arita Miss ? ? ? ? ?

## 2022-02-09 NOTE — Anesthesia Procedure Notes (Signed)
Procedure Name: LMA Insertion ?Date/Time: 02/09/2022 1:05 PM ?Performed by: Demetrius Charity, CRNA ?Pre-anesthesia Checklist: Patient identified, Patient being monitored, Timeout performed, Emergency Drugs available and Suction available ?Patient Re-evaluated:Patient Re-evaluated prior to induction ?Oxygen Delivery Method: Circle system utilized ?Preoxygenation: Pre-oxygenation with 100% oxygen ?Induction Type: IV induction ?Ventilation: Mask ventilation without difficulty ?LMA: LMA inserted ?LMA Size: 4.0 ?Tube type: Oral ?Number of attempts: 1 ?Placement Confirmation: positive ETCO2 and breath sounds checked- equal and bilateral ?Tube secured with: Tape ?Dental Injury: Teeth and Oropharynx as per pre-operative assessment  ? ? ? ? ?

## 2022-02-09 NOTE — Op Note (Signed)
Preoperative diagnosis:  ?Right hydrocele ? ?Postoperative diagnosis:  ?Right hydrocele ? ?Procedure: ?Right hydrocelectomy ? ?Surgeon: Abbie Sons, MD ? ?Anesthesia: General ? ?Complications: None ? ?Intraoperative findings: Large septated right hydrocele.  Testis normal in appearance.  380 cc clear fluid suctioned ? ?EBL: Minimal ? ?Specimens: None ? ?Indication: Alexander [facility] is a 66 y.o. male with a large symptomatic right hydrocele.  History of prostate cancer status post radical prostatectomy with urinary incontinence and he is contemplating artificial sphincter placement.  It was recommended his hydrocele be repaired and healed prior to AUS surgery.  After reviewing the management options for treatment, he elected to proceed with the above surgical procedure(s). We have discussed the potential benefits and risks of the procedure, side effects of the proposed treatment, the likelihood of the patient achieving the goals of the procedure, and any potential problems that might occur during the procedure or recuperation. Informed consent has been obtained. ? ?Description of procedure: ? ?The patient was taken to the operating room and general anesthesia was induced.  The patient was placed in the supine position, prepped and draped in the usual sterile fashion, and preoperative antibiotics were administered. A preoperative time-out was performed.  ? ?A transverse incision was made in the midportion of the right hemiscrotum.  Dartos fascia was incised with cautery to expose the hydrocele sac.  The hydrocele sac was then bluntly separated from scrotal walls with a combination of blunt dissection and cautery and delivered into the operative field.  The sac was then incised anteriorly and 380 mL of clear fluid was suctioned.  The hydrocele sac was then opened anteriorly with cautery.  The testis and cord structures were normal in appearance.  There was no evidence of a communicating hydrocele.  The hydrocele  sac was excised with cautery leaving a rim of approximately 2 cm around the testis and cord structures. The sac edges were then whipstitched with a running 3-0 chromic suture.   ? ?Hemostasis was obtained with cautery and noted to be adequate.  The testis, cord structures and scrotum were copiously irrigated with sterile saline. ? ?1/4 inch Penrose drain was placed through the dependent portion of the right hemiscrotum via a separate stab incision and secured with 4-0 nylon.  The testis was delivered back into the scrotum in its anatomic position.  The incision was anesthetized with 0.25% plain Sensorcaine.  The dartos was closed with a running suture of 3-0 chromic and the skin was closed with a running horizontal mattress suture of 3-0 chromic.  A dressing of fluffs and scrotal support was applied. ? ?The patient was transported to the PACU in stable condition after anesthetic reversal. ? ?Plan: ?Office follow-up schedule 02/12/2022 for drain removal ? ? ?Abbie Sons, M.D. ? ?

## 2022-02-09 NOTE — Interval H&P Note (Signed)
History and Physical Interval Note: ? ?CV:RRR ?Lungs:clear ? ?02/09/2022 ?12:41 PM ? ?Alexander Dixon  has presented today for surgery, with the diagnosis of Hydrocele.  The various methods of treatment have been discussed with the patient and family. After consideration of risks, benefits and other options for treatment, the patient has consented to  Procedure(s): ?HYDROCELECTOMY ADULT (Right) as a surgical intervention.  The patient's history has been reviewed, patient examined, no change in status, stable for surgery.  I have reviewed the patient's chart and labs.  Questions were answered to the patient's satisfaction.   ? ? ?Kloey Cazarez C Tallen Schnorr ? ? ?

## 2022-02-09 NOTE — Transfer of Care (Signed)
Immediate Anesthesia Transfer of Care Note ? ?Patient: Alexander Dixon ? ?Procedure(s) Performed: HYDROCELECTOMY ADULT (Right: Scrotum) ? ?Patient Location: PACU ? ?Anesthesia Type:General ? ?Level of Consciousness: sedated ? ?Airway & Oxygen Therapy: Patient Spontanous Breathing and Patient connected to face mask oxygen ? ?Post-op Assessment: Report given to RN and Post -op Vital signs reviewed and stable ? ?Post vital signs: Reviewed and stable ? ?Last Vitals:  ?Vitals Value Taken Time  ?BP 145/70 02/09/22 1420  ?Temp 36 ?C 02/09/22 1420  ?Pulse 66 02/09/22 1420  ?Resp 12 02/09/22 1420  ?SpO2 99 % 02/09/22 1420  ?Vitals shown include unvalidated device data. ? ?Last Pain:  ?Vitals:  ? 02/09/22 1109  ?TempSrc: Oral  ?PainSc: 0-No pain  ?   ? ?  ? ?Complications: No notable events documented. ?

## 2022-02-09 NOTE — Anesthesia Preprocedure Evaluation (Addendum)
Anesthesia Evaluation  ?Patient identified by MRN, date of birth, ID band ?Patient awake ? ? ? ?Reviewed: ?Allergy & Precautions, NPO status , Patient's Chart, lab work & pertinent test results ? ?History of Anesthesia Complications ?Negative for: history of anesthetic complications ? ?Airway ?Mallampati: I ? ?TM Distance: >3 FB ?Neck ROM: Full ? ? ? Dental ?no notable dental hx. ?(+) Teeth Intact ?  ?Pulmonary ?neg sleep apnea, COPD, Patient abstained from smoking.Not current smoker, former smoker,  ?  ?Pulmonary exam normal ?breath sounds clear to auscultation ? ? ? ? ? ? Cardiovascular ?Exercise Tolerance: Good ?METShypertension, Pt. on medications ?+ CAD  ?(-) Past MI (-) dysrhythmias  ?Rhythm:Regular Rate:Normal ?- Systolic murmurs ? ?  ?Neuro/Psych ?negative neurological ROS ? negative psych ROS  ? GI/Hepatic ?neg GERD  ,(+)  ?  ? (-) substance abuse ? ,   ?Endo/Other  ?diabetes ? Renal/GU ?negative Renal ROS  ? ?  ?Musculoskeletal ? ? Abdominal ?  ?Peds ? Hematology ?  ?Anesthesia Other Findings ?Past Medical History: ?No date: Arthritis ?    Comment:  right knee ?09/27/2019: COPD, mild ([place]) ?    Comment:  FVC 71% and FEV1 73% untreated ?No date: Diabetes mellitus without complication ([place]) ?No date: History of chicken pox ?No date: Prostate cancer ([place]) ?11/21/2019: Pure hypercholesterolemia ? Reproductive/Obstetrics ? ?  ? ? ? ? ? ? ? ? ? ? ? ? ? ?  ?  ? ? ? ? ? ? ? ?                                  Anesthesia Evaluation  ?Patient identified by MRN, date of birth, ID band ?Patient awake ? ? ? ?Reviewed: ?Allergy & Precautions, NPO status , Patient's Chart, lab work & pertinent test results ? ?History of Anesthesia Complications ?Negative for: history of anesthetic complications ? ?Airway ?Mallampati: II ? ?TM Distance: >3 FB ?Neck ROM: Full ? ? ? Dental ?no notable dental hx. ? ?  ?Pulmonary ?neg sleep apnea, COPD,  COPD inhaler, former smoker,  ?  ?Pulmonary exam  normal ?breath sounds clear to auscultation- rhonchi ?(-) wheezing ? ? ? ? ? Cardiovascular ?Exercise Tolerance: Good ?(-) hypertension(-) CAD, (-) Past MI, (-) Cardiac Stents and (-) CABG Normal cardiovascular exam ?Rhythm:Regular Rate:Normal ?- Systolic murmurs and - Diastolic murmurs ? ?  ?Neuro/Psych ?neg Seizures negative neurological ROS ? negative psych ROS  ? GI/Hepatic ?negative GI ROS, Neg liver ROS,   ?Endo/Other  ?diabetes, Oral Hypoglycemic Agents ? Renal/GU ?negative Renal ROS  ? ?  ?Musculoskeletal ?negative musculoskeletal ROS ?(+)  ? Abdominal ?Normal abdominal exam  (+) - obese,   ?Peds ? Hematology ?negative hematology ROS ?(+)   ?Anesthesia Other Findings ?Past Medical History: ?No date: Diabetes mellitus without complication ([place]) ?No date: History of chicken pox ?No date: Prostate cancer [facility]) ? ? Reproductive/Obstetrics ? ?  ? ? ? ? ? ? ? ? ? ? ? ? ? ?  ?  ? ? ? ? ? ? ? ? ?Anesthesia Physical ? ?Anesthesia Plan ? ?ASA: III ? ?Anesthesia Plan: General  ? ?Post-op Pain Management:   ? ?Induction: Intravenous ? ?PONV Risk Score and Plan: 1 and Propofol infusion ? ?Airway Management Planned: LMA ? ?Additional Equipment:  ? ?Intra-op Plan:  ? ?Post-operative Plan:  ? ?Informed Consent: I have reviewed the patients History and Physical, chart, labs and discussed the procedure including  the risks, benefits and alternatives for the proposed anesthesia with the patient or authorized representative who has indicated his/her understanding and acceptance.  ? ? ? ?Dental advisory given ? ?Plan Discussed with: CRNA and Surgeon ? ?Anesthesia Plan Comments: (Avoid long acting paralytic for facial nerve monitoring)  ? ? ? ? ? ?Anesthesia Quick Evaluation ? ?Patient Active Problem List  ? Diagnosis Date Noted  ?? Hyperlipidemia associated with type 2 diabetes mellitus ([place]) 01/21/2022  ?? Absence of bladder continence 04/07/2021  ?? Benign essential HTN 01/08/2021  ?? Chronic constipation 10/08/2020  ?? Skin  mass 07/08/2020  ?? Mild atherosclerosis of carotid artery, right 05/07/2020  ?? Elevated BP without diagnosis of hypertension 04/03/2020  ?? Nonrheumatic mitral (valve) insufficiency 11/21/2019  ?? Cerebrovascular disease 11/21/2019  ?? Pure hypercholesterolemia 11/21/2019  ?? Erectile dysfunction 11/21/2019  ?? Coronary arteriosclerosis 11/21/2019  ?? COPD, mild ([place]) 09/27/2019  ?? Type 2 diabetes mellitus without complication, without long-term current use of insulin ([place]) 09/27/2019  ?? History of prostate cancer 08/03/2019  ? ? ?CBC Latest Ref Rng & Units 02/05/2022 04/03/2020 10/05/2019  ?WBC 4.0 - 10.5 K/uL 6.9 7.7 5.6  ?Hemoglobin 13.0 - 17.0 g/dL 13.6 14.1 11.3(A)  ?Hematocrit 39.0 - 52.0 % 42.7 42.6 36(A)  ?Platelets 150 - 400 K/uL 280 269.0 213  ? ?BMP Latest Ref Rng & Units 01/21/2022 04/07/2021 12/08/2020  ?Glucose 70 - 99 mg/dL 134(H) 153(H) -  ?BUN 6 - 23 mg/dL 24(H) 25(H) -  ?Creatinine 0.40 - 1.50 mg/dL 0.93 1.01 0.90  ?Sodium 135 - 145 mEq/L 140 140 -  ?Potassium 3.5 - 5.1 mEq/L 4.9 5.1 -  ?Chloride 96 - 112 mEq/L 102 102 -  ?CO2 19 - 32 mEq/L 35(H) 32 -  ?Calcium 8.4 - 10.5 mg/dL 9.9 10.1 -  ? ? ?Risks and benefits of anesthesia discussed at length, patient or surrogate demonstrates understanding. Appropriately NPO. Plan to proceed with anesthesia. ? ?Champ Mungo, MD ?02/09/22 ? ? ?Anesthesia Physical ?Anesthesia Plan ? ?ASA: 2 ? ?Anesthesia Plan: General  ? ?Post-op Pain Management: Ofirmev IV (intra-op)*  ? ?Induction: Intravenous ? ?PONV Risk Score and Plan: 3 and Ondansetron, Dexamethasone and Midazolam ? ?Airway Management Planned: LMA ? ?Additional Equipment: None ? ?Intra-op Plan:  ? ?Post-operative Plan: Extubation in OR ? ?Informed Consent: I have reviewed the patients History and Physical, chart, labs and discussed the procedure including the risks, benefits and alternatives for the proposed anesthesia with the patient or authorized representative who has indicated his/her understanding and  acceptance.  ? ? ? ?Dental advisory given ? ?Plan Discussed with: CRNA and Surgeon ? ?Anesthesia Plan Comments: (Discussed risks of anesthesia with patient, including PONV, sore throat, lip/dental/eye damage. Rare risks discussed as well, such as cardiorespiratory and neurological sequelae, and allergic reactions. Discussed the role of CRNA in patient's perioperative care. Patient understands.)  ? ? ? ? ? ? ?Anesthesia Quick Evaluation ? ?

## 2022-02-10 ENCOUNTER — Encounter: Payer: Self-pay | Admitting: Urology

## 2022-02-12 ENCOUNTER — Ambulatory Visit: Payer: Medicare Other | Admitting: Urology

## 2022-02-12 ENCOUNTER — Other Ambulatory Visit: Payer: Self-pay

## 2022-02-12 ENCOUNTER — Encounter: Payer: Self-pay | Admitting: Urology

## 2022-02-12 DIAGNOSIS — N9989 Other postprocedural complications and disorders of genitourinary system: Secondary | ICD-10-CM

## 2022-02-12 DIAGNOSIS — Z9889 Other specified postprocedural states: Secondary | ICD-10-CM

## 2022-02-12 DIAGNOSIS — N393 Stress incontinence (female) (male): Secondary | ICD-10-CM

## 2022-02-12 MED ORDER — HYDROCODONE-ACETAMINOPHEN 7.5-325 MG PO TABS: 1.0000 | ORAL_TABLET | Freq: Four times a day (QID) | ORAL | 0 refills | Status: DC | PRN

## 2022-02-12 NOTE — Progress Notes (Signed)
66 y.o. male status post right hydrocelectomy 3/14.  No postop complaints ?Minimal Penrose drainage at the last 24 hours ? ?Exam: Moderate right hemiscrotal ecchymosis and edema.  Incision clean and intact ? ?Impression: Doing well status post right hydrocelectomy ? ?Plan: ?Penrose removed ?May shower in 24 hours; no bath x1 week ?Hydrocodone was refilled ?He requested physical therapy referral for his urinary incontinence.  He wants to try again prior to scheduling artificial sphincter surgery  ?Postop follow-up 1 month ? ? ?Alexander Giovanni, MD ? ?

## 2022-03-06 ENCOUNTER — Other Ambulatory Visit: Payer: Self-pay | Admitting: Family Medicine

## 2022-03-06 DIAGNOSIS — E7800 Pure hypercholesterolemia, unspecified: Secondary | ICD-10-CM

## 2022-03-06 DIAGNOSIS — E119 Type 2 diabetes mellitus without complications: Secondary | ICD-10-CM

## 2022-03-06 DIAGNOSIS — I6521 Occlusion and stenosis of right carotid artery: Secondary | ICD-10-CM

## 2022-03-08 ENCOUNTER — Ambulatory Visit: Payer: Medicare Other | Attending: Urology | Admitting: Physical Therapy

## 2022-03-08 ENCOUNTER — Encounter: Payer: Self-pay | Admitting: Physical Therapy

## 2022-03-08 DIAGNOSIS — N9989 Other postprocedural complications and disorders of genitourinary system: Secondary | ICD-10-CM | POA: Diagnosis not present

## 2022-03-08 DIAGNOSIS — R293 Abnormal posture: Secondary | ICD-10-CM | POA: Diagnosis not present

## 2022-03-08 DIAGNOSIS — N393 Stress incontinence (female) (male): Secondary | ICD-10-CM | POA: Diagnosis not present

## 2022-03-08 DIAGNOSIS — M533 Sacrococcygeal disorders, not elsewhere classified: Secondary | ICD-10-CM | POA: Diagnosis not present

## 2022-03-08 DIAGNOSIS — G8929 Other chronic pain: Secondary | ICD-10-CM

## 2022-03-08 DIAGNOSIS — R2689 Other abnormalities of gait and mobility: Secondary | ICD-10-CM

## 2022-03-08 DIAGNOSIS — M25561 Pain in right knee: Secondary | ICD-10-CM | POA: Diagnosis not present

## 2022-03-08 DIAGNOSIS — C61 Malignant neoplasm of prostate: Secondary | ICD-10-CM | POA: Insufficient documentation

## 2022-03-08 NOTE — Patient Instructions (Signed)
?  Avoid straining pelvic floor, abdominal muscles , spine  ?Use log rolling technique instead of getting out of bed with your neck or the sit-up  ? ? ? ?Log rolling into and out of bed ? ? ?Log rolling into and out of bed ?If getting out of bed on R side, ?Bent knees, scoot hips/ shoulder to L  ?Raise R arm completely overhead, rolling onto armpit  ?Then lower bent knees to bed to get into complete side lying position  ?Then drop legs off bed, and push up onto R elbow/forearm, and use L hand to push onto the bed ? ? ?___ ? ? ?Proper body mechanics with getting out of a chair to decrease strain  ?on back &pelvic floor  ? ?Avoid holding your breath when ?Getting out of the chair: ? ?Scoot to front part of chair chair ?Heels behind knees, feet are hip width apart, nose over toes  ?Inhale like you are smelling roses ?Exhale to stand  ? ?___ ?Twice a day  ? ?Lengthen Back rib by R  shoulder  ?  ?Lie on L  side , pillow between knees and under head  ?Pull  arm overhead over mattress, grab the edge of mattress,pull it upward, drawing elbow away from ears  ?Breathing ?10 reps ? ?Open book (handout)  ?Lying on  _R side , rotating  _L_ only this week  ?Rotating onto pillow /yoga block , dragging top hand on bottom arm, 3/4 turn onto pillow behind back  ?Pillow/ Block between knees  ?10 reps ? ?___ ? ?Hold off on deadlifting, adductor machine ( thigh in )  ?Exhale with lifting bicep and tricep curls  ? ?Ok to keep doing cardio  ? ? ? ?
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
## 2022-03-15 ENCOUNTER — Encounter: Payer: Self-pay | Admitting: Urology

## 2022-03-15 ENCOUNTER — Ambulatory Visit: Payer: Medicare Other | Admitting: Physical Therapy

## 2022-03-15 ENCOUNTER — Ambulatory Visit: Payer: Medicare Other | Admitting: Urology

## 2022-03-15 VITALS — BP 137/69 | HR 57 | Ht 72.0 in | Wt 190.0 lb

## 2022-03-15 DIAGNOSIS — N9989 Other postprocedural complications and disorders of genitourinary system: Secondary | ICD-10-CM

## 2022-03-15 DIAGNOSIS — G8929 Other chronic pain: Secondary | ICD-10-CM | POA: Diagnosis not present

## 2022-03-15 DIAGNOSIS — M533 Sacrococcygeal disorders, not elsewhere classified: Secondary | ICD-10-CM

## 2022-03-15 DIAGNOSIS — N393 Stress incontinence (female) (male): Secondary | ICD-10-CM

## 2022-03-15 DIAGNOSIS — R2689 Other abnormalities of gait and mobility: Secondary | ICD-10-CM

## 2022-03-15 DIAGNOSIS — Z9889 Other specified postprocedural states: Secondary | ICD-10-CM

## 2022-03-15 DIAGNOSIS — M25561 Pain in right knee: Secondary | ICD-10-CM | POA: Diagnosis not present

## 2022-03-15 DIAGNOSIS — R293 Abnormal posture: Secondary | ICD-10-CM

## 2022-03-15 NOTE — Progress Notes (Signed)
? ?03/15/2022 ?9:17 AM  ? ?Alexander Dixon ?07/25/1956 ?[id number] ? ?Referring provider: Lesleigh Noe, MD ?[place],  [place] [address] ? ?Chief Complaint  ?Patient presents with  ? Follow-up  ? ? ?HPI: ?66 y.o. male presents for postop follow-up. ? ?Right hydrocelectomy 02/09/2022 ?No postop problems ?This was initially done in anticipation of placement of an artificial urinary sphincter ?He elected to retry pelvic floor PT prior to proceeding with sphincter implantation which he is currently undergoing and states he is already seeing improvement. ?Last PSA January 2023 was undetectable ? ? ?PMH: ?Past Medical History:  ?Diagnosis Date  ? Arthritis   ? right knee  ? COPD, mild ([place]) 09/27/2019  ? FVC 71% and FEV1 73% untreated  ? Diabetes mellitus without complication ([place])   ? History of chicken pox   ? Prostate cancer ([place]) 2020  ? Pure hypercholesterolemia 11/21/2019  ? ? ?Surgical History: ?Past Surgical History:  ?Procedure Laterality Date  ? COLONOSCOPY WITH PROPOFOL N/A 04/21/2020  ? Procedure: COLONOSCOPY WITH PROPOFOL;  Surgeon: Jonathon Bellows, MD;  [facility] ENDOSCOPY;  Service: Gastroenterology;  Laterality: N/A;  POSITIVE 3/23  ? CONTINUOUS NERVE MONITORING N/A 01/07/2021  ? Procedure: FACIAL NERVE MONITORING;  Surgeon: Carloyn Manner, MD;  Location: [facility];  Service: ENT;  Laterality: N/A;  ? EAR CYST EXCISION Right 01/07/2021  ? Procedure: EXCISION EPIDERMAL INCLUSION CYST;  Surgeon: Carloyn Manner, MD;  Location: [facility];  Service: ENT;  Laterality: Right;  ? HERNIA REPAIR Left   ? inguinal  ? HYDROCELE EXCISION Right 02/09/2022  ? Procedure: HYDROCELECTOMY ADULT;  Surgeon: Abbie Sons, MD;  Location: [facility] ORS;  Service: Urology;  Laterality: Right;  ? KNEE ARTHROSCOPY Right 1984  ? PROSTATE SURGERY    ? due to prostate cancer  ? ? ?Home Medications:  ?Allergies as of 03/15/2022   ?No Known Allergies ?  ? ?  ?Medication List  ?  ? ?  ? Accurate  as of March 15, 2022  9:17 AM. If you have any questions, ask your nurse or doctor.  ?  ?  ? ?  ? ?STOP taking these medications   ? ?HYDROcodone-acetaminophen 7.5-325 MG tablet ?Commonly known as: NORCO ?Stopped by: Abbie Sons, MD ?  ? ?  ? ?TAKE these medications   ? ?Advil PM 200-25 MG Caps ?Generic drug: Ibuprofen-diphenhydrAMINE HCl ?Take 1 capsule by mouth at bedtime as needed (sleep). ?  ?atorvastatin 10 MG tablet ?Commonly known as: LIPITOR ?TAKE 1 TABLET BY MOUTH  DAILY ?  ?Hair/Skin/Nails Caps ?Take 1 capsule by mouth daily. ?  ?KRILL OIL PO ?Take 1,200 mg by mouth daily. ?  ?latanoprost 0.005 % ophthalmic solution ?Commonly known as: XALATAN ?Place 1 drop into both eyes at bedtime. ?  ?metFORMIN 1000 MG tablet ?Commonly known as: GLUCOPHAGE ?Take 1 tablet (1,000 mg total) by mouth 2 (two) times daily. ?  ?OneTouch Delica Plus [id number] Misc ?USE TO CHECK BLOOD SUGAR TWICE  DAILY ?  ?OneTouch Ultra test strip ?Generic drug: glucose blood ?USE TO CHECK BLOOD SUGAR  TWICE DAILY ?  ?OptumRx Blood Glucose Meter w/Device Kit ?Meter choice per insurance. ?  ?OVER THE COUNTER MEDICATION ?Take 1 Container by mouth 2 (two) times daily. celsius live fit vitamin drink ?  ?Rogaine Mens Extra Strength 5 % Foam ?Generic drug: Minoxidil ?Apply 1 application. topically 2 (two) times daily. ?  ?sildenafil 100 MG tablet ?Commonly known as: VIAGRA ?Take 1 tablet (100  mg total) by mouth daily as needed for erectile dysfunction. ?What changed:  ?how much to take ?when to take this ?  ?TURMERIC PO ?Take 2,000 mg by mouth daily as needed (inflammation). ?  ?[place] ?Inhale 1 puff into the lungs daily as needed (congestion). ?  ?Vitamin D3 50 MCG (2000 UT) Tabs ?Take 2,000 Units by mouth daily. ?  ? ?  ? ? ?Allergies: No Known Allergies ? ?Family History: ?Family History  ?Problem Relation Age of Onset  ? Leukemia Mother   ? Arthritis Mother   ? Diabetes Mother   ? Alcohol abuse Father   ? COPD Father   ?  Arthritis Maternal Grandmother   ? Diabetes Maternal Grandmother   ? Hearing loss Maternal Grandmother   ? Arthritis Maternal Grandfather   ? Heart attack Maternal Grandfather 52  ? Diabetes Maternal Grandfather   ? Heart disease Maternal Grandfather   ? Hyperlipidemia Maternal Grandfather   ? Hypertension Maternal Grandfather   ? Prostate cancer Maternal Grandfather   ? Prostate cancer Maternal Uncle   ? Diabetes Maternal Uncle   ? Prostate cancer Cousin   ? Diabetes Maternal Uncle   ? ? ?Social History:  reports that he quit smoking about 21 years ago. His smoking use included cigarettes. He has a 30.00 pack-year smoking history. He has never used smokeless tobacco. He reports that he does not currently use alcohol. He reports that he does not use drugs. ? ? ?Physical Exam: ?BP 137/69   Pulse (!) 57   Ht 6' (1.829 m)   Wt 190 lb (86.2 kg)   BMI 25.77 kg/m?   ?Constitutional:  Alert and oriented, No acute distress. ?HEENT: Central AT, moist mucus membranes.  Trachea midline, no masses. ?Cardiovascular: No clubbing, cyanosis, or edema. ?Respiratory: Normal respiratory effort, no increased work of breathing. ?GI: Abdomen is soft, nontender, nondistended, no abdominal masses ?GU: Right hemiscrotal incision healed.  Mild intrascrotal thickening consistent with postsurgical changes.  No tenderness ?Skin: No rashes, bruises or suspicious lesions. ?Neurologic: Grossly intact, no focal deficits, moving all 4 extremities. ?Psychiatric: Normal mood and affect. ? ? ? ?Assessment & Plan:   ?Doing well status post right hydrocelectomy ?He will follow-up with Dr. Matilde Sprang if he desires to pursue sphincter implantation ?Lab visit for PSA July 2023 and office visit with PSA January 2024 ? ? ?Abbie Sons, MD ? ?[facility] ?[address] ?[place], [place] [address] ?[phone] ?

## 2022-03-15 NOTE — Addendum Note (Signed)
Addended by: Kyra Manges on: 03/15/2022 10:59 AM ? ? Modules accepted: Orders ? ?

## 2022-03-15 NOTE — Patient Instructions (Signed)
Angels wings 10 reps for shoulder and posture improvement ? ?Deep core level 1-2  ?

## 2022-03-15 NOTE — Therapy (Addendum)
[facility] [facility] ?[place], [place], [address] ?Phone: [phone]   Fax:  [phone] ? ?Physical Therapy Treatment ? ?Patient Details  ?Name: Alexander Dixon ?MRN: [id number] ?Date of Birth: Sep 21, 1956 ?Referring Provider (PT): Sto[place] ? ? ?Encounter Date: 03/15/2022 ? ? PT End of Session - 03/15/22 1809   ? ? Visit Number 2   ? Number of Visits 10   ? Date for PT Re-Evaluation 05/18/22   ? PT Start Time 2355   ? PT Stop Time 7322   ? PT Time Calculation (min) 57 min   ? ?  ?  ? ?  ? ? ?Past Medical History:  ?Diagnosis Date  ? Arthritis   ? right knee  ? COPD, mild ([place]) 09/27/2019  ? FVC 71% and FEV1 73% untreated  ? Diabetes mellitus without complication ([place])   ? History of chicken pox   ? Prostate cancer ([place]) 2020  ? Pure hypercholesterolemia 11/21/2019  ? ? ?Past Surgical History:  ?Procedure Laterality Date  ? COLONOSCOPY WITH PROPOFOL N/A 04/21/2020  ? Procedure: COLONOSCOPY WITH PROPOFOL;  Surgeon: Jonathon Bellows, MD;  Location[facility] ENDOSCOPY;  Service: Gastroenterology;  Laterality: N/A;  POSITIVE 3/23  ? CONTINUOUS NERVE MONITORING N/A 01/07/2021  ? Procedure: FACIAL NERVE MONITORING;  Surgeon: Carloyn Manner, MD;  Location: [place];  Service: ENT;  Laterality: N/A;  ? EAR CYST EXCISION Right 01/07/2021  ? Procedure: EXCISION EPIDERMAL INCLUSION CYST;  Surgeon: Carloyn Manner, MD;  Location: [facility];  Service: ENT;  Laterality: Right;  ? HERNIA REPAIR Left   ? inguinal  ? HYDROCELE EXCISION Right 02/09/2022  ? Procedure: HYDROCELECTOMY ADULT;  Surgeon: Abbie Sons, MD;  Location: [facility] ORS;  Service: Urology;  Laterality: Right;  ? KNEE ARTHROSCOPY Right 1984  ? PROSTATE SURGERY    ? due to prostate cancer  ? ? ?There were no vitals filed for this visit. ? ? Subjective Assessment - 03/15/22 1711   ? ? Subjective Pt reported he noticed  a difference since last session. Pt has been able to wake up and walk  to the bathroom without leakage. Pt practiced log rolling.   ? Pertinent History Hx of prostatectomy, hydrocelectomy, diabetes II,  denied LBP, constipation, pain withurinaiton and bowel movements.  inguinal hernia repair L   ? Patient Stated Goals cure his incontinence   ? ?  ?  ? ?  ? ? ? ? ? [facility] PT Assessment - 03/15/22 1815   ? ?  ? Observation/Other Assessments  ? Observations reported shoulder pain with moist pad under thoracic spine, no pain when support on pillows in semi recline position   ?  ? Coordination  ? Coordination and Movement Description poor coordination with deep core levle 2,   ?  ? AROM  ? Overall AROM Comments increased thoracic mobiloity sideflexion/ rotation   ?  ? Palpation  ? SI assessment  tightness at posterior intercostals, medial scapular, levator scapula, teres minor , hypombile upper T segments   ? Palpation comment tightness along C/T junction medial scapula, upper trap, intercostals, interspinals B   ? ?  ?  ? ?  ? ? ? ? ? ? ? ? ? ? ? ? ? ? ? ? [facility] Adult PT Treatment/Exercise - 03/15/22 1712   ? ?  ? Neuro Re-ed   ? Neuro Re-ed Details  cued for cervicoscapular retraction HEP and deep core 1-2   ?  ?  Modalities  ? Modalities Moist Heat   ?  ? Moist Heat Therapy  ? Number Minutes Moist Heat 5 Minutes   ? Moist Heat Location --   thoracic/ cervical, modified position to minimize shoulder pain into semi-reclined position, guided depe core instructions while receiving moist  ?  ? Manual Therapy  ? Manual therapy comments STM/MWM at thoracic  and shoulder to promote more upright posture   ? ?  ?  ? ?  ? ? ? ? ? ? ? ? ? ? ? ? ? ? ? PT Long Term Goals - 03/08/22 1628   ? ?  ? PT LONG TERM GOAL #1  ? Title Pt will wean off of wearing penile clamp for 2 hours while being on his feet in order to prepare to go in the community activities without the clamp   ? Time 10   ? Period Weeks   ? Status New   ? Target Date 05/17/22   ?  ? PT LONG TERM GOAL #2  ? Title Pt will demo equal pelvic  girdle and shoulder height across 2 weeks in order to minimize knee pain and advance to deep core exericses for continence   ? Time 2   ? Period Weeks   ? Status New   ? Target Date 03/22/22   ?  ? PT LONG TERM GOAL #3  ? Title Pt will demo IND and understanding how to modify his workout routine with weight lifting and demo proper alignment and co-activaiton of deep core system to minimzie SUI   ? Time 10   ? Period Weeks   ? Status New   ? Target Date 05/17/22   ?  ? PT LONG TERM GOAL #4  ? Title Pt will demo less forward head posture/ thoracic kyphosis, separation at linea alba below sternal costal angle and more optimal excursion of diaphragm in order to improve IAP system for continence   ? Time 4   ? Period Weeks   ? Status New   ? Target Date 04/06/22   ?  ? PT LONG TERM GOAL #5  ? Title Pt will  improve > 5 pt changee with FOTO Urinary Problem 46 pts and PFDI Urinary from 46 pts in order to demo imrpve continence and improved QOL   ? Time 10   ? Period Weeks   ? Status New   ? Target Date 05/18/22   ? ?  ?  ? ?  ? ? ? ? ? ? ? ? Plan - 03/15/22 1814   ? ? Clinical Impression Statement Pt reported positive response to first Tx as pt is not leakage getting out of bed. Pt showed good carry over levelled pelvic girdle and improved gait. Pt required manual Tx to minimize thoracic kyphosis and forward head and optimize diaphragm excursion post Tx. Pt was progressed to deep core coordination today. Pt required modification to semi reclined position to deep core coordination to minimize shoulder pain. Plan to review and modify his workout routine at next session. Pt continues to benefit from skilled PT.   ? Personal Factors and Comorbidities Fitness   ? Examination-Activity Limitations Continence;Locomotion Level;Stand   ? Stability/Clinical Decision Making Evolving/Moderate complexity   ? Rehab Potential Good   ? PT Frequency 1x / week   ? PT Duration Other (comment)   10  ? PT Treatment/Interventions Functional  mobility training;Therapeutic activities;Therapeutic exercise;Neuromuscular re-education;Balance training;Manual techniques;Splinting;Taping;Scar mobilization;Moist Heat;Gait training;Cryotherapy;Dry needling;Patient/family education;Spinal Manipulations;Joint Manipulations   ?  Consulted and Agree with Plan of Care Patient   ? ?  ?  ? ?  ? ? ?Patient will benefit from skilled therapeutic intervention in order to improve the following deficits and impairments:  Decreased activity tolerance, Decreased endurance, Decreased range of motion, Decreased strength, Hypomobility, Decreased coordination, Decreased mobility, Postural dysfunction, Abnormal gait, Improper body mechanics, Pain, Increased muscle spasms, Decreased safety awareness, Impaired flexibility ? ?Visit Diagnosis: ?Sacrococcygeal disorders, not elsewhere classified ? ?Other abnormalities of gait and mobility ? ?Abnormal posture ? ?Chronic pain of right knee ? ? ? ? ?Problem List ?Patient Active Problem List  ? Diagnosis Date Noted  ? Prostate cancer ([place]) 03/08/2022  ? Hyperlipidemia associated with type 2 diabetes mellitus ([place]) 01/21/2022  ? Absence of bladder continence 04/07/2021  ? Benign essential HTN 01/08/2021  ? Chronic constipation 10/08/2020  ? Skin mass 07/08/2020  ? Mild atherosclerosis of carotid artery, right 05/07/2020  ? Elevated BP without diagnosis of hypertension 04/03/2020  ? Nonrheumatic mitral (valve) insufficiency 11/21/2019  ? Cerebrovascular disease 11/21/2019  ? Pure hypercholesterolemia 11/21/2019  ? Erectile dysfunction 11/21/2019  ? Coronary arteriosclerosis 11/21/2019  ? COPD, mild ([place]) 09/27/2019  ? Type 2 diabetes mellitus without complication, without long-term current use of insulin ([place]) 09/27/2019  ? History of prostate cancer 08/03/2019  ? ? ?Jerl Mina, PT ?03/15/2022, 6:18 PM ? ? ?[facility] ?[place], [place], [address] ?Phone: [phone]    Fax:  [phone] ? ?Name: Alexander Dixon ?MRN: [id number] ?Date of Birth: 1956-06-14 ? ? ? ?

## 2022-03-23 ENCOUNTER — Ambulatory Visit: Payer: Medicare Other | Admitting: Physical Therapy

## 2022-03-23 DIAGNOSIS — R293 Abnormal posture: Secondary | ICD-10-CM

## 2022-03-23 DIAGNOSIS — R2689 Other abnormalities of gait and mobility: Secondary | ICD-10-CM | POA: Diagnosis not present

## 2022-03-23 DIAGNOSIS — M533 Sacrococcygeal disorders, not elsewhere classified: Secondary | ICD-10-CM

## 2022-03-23 DIAGNOSIS — M25561 Pain in right knee: Secondary | ICD-10-CM | POA: Diagnosis not present

## 2022-03-23 DIAGNOSIS — G8929 Other chronic pain: Secondary | ICD-10-CM

## 2022-03-23 NOTE — Patient Instructions (Signed)
Deep core level 1 and 2   ?

## 2022-03-23 NOTE — Therapy (Signed)
[facility] ?[place], [place], [address] ?Phone: [phone]   Fax:  [phone] ? ?Physical Therapy Treatment ? ?Patient Details  ?Name: Alexander Dixon ?MRN: [id number] ?Date of Birth: 10-12-1956 ?Referring Provider (PT): Stoiff ? ? ?Encounter Date: 03/23/2022 ? ? PT End of Session - 03/23/22 1647   ? ? Visit Number 3   ? Number of Visits 10   ? Date for PT Re-Evaluation 05/18/22   ? PT Start Time 1002   ? PT Stop Time 1100   ? PT Time Calculation (min) 58 min   ? ?  ?  ? ?  ? ? ?Past Medical History:  ?Diagnosis Date  ? Arthritis   ? right knee  ? 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 FVC 71% and FEV1 73% untreated  ? Diabetes mellitus without complication ([place])   ? History of chicken pox   ? Prostate cancer ([place]) 2020  ? Pure hypercholesterolemia 11/21/2019  ? ? ?Past Surgical History:  ?Procedure Laterality Date  ? COLONOSCOPY WITH PROPOFOL N/A 04/21/2020  ? Procedure: COLONOSCOPY WITH PROPOFOL;  Surgeon: Jonathon Bellows, MD;  Location: [facility]OSCOPY;  Service: Gastroenterology;  Laterality: N/A;  POSITIVE 3/23  ? CONTINUOUS NERVE MONITORING N/A 01/07/2021  ? Procedure: FACIAL NERVE MONITORING;  Surgeon: Carloyn Manner, MD;  Location: [facility];  Service: ENT;  Laterality: N/A;  ? EAR CYST EXCISION Right 01/07/2021  ? Procedure: EXCISION EPIDERMAL INCLUSION CYST;  Surgeon: Carloyn Manner, MD;  Location: [facility];  Service: ENT;  Laterality: Right;  ? HERNIA REPAIR Left   ? inguinal  ? HYDROCELE EXCISION Right 02/09/2022  ? Procedure: HYDROCELECTOMY ADULT;  Surgeon: Abbie Sons, MD;  Location: [facility] ORS;  Service: Urology;  Laterality: Right;  ? KNEE ARTHROSCOPY Right 1984  ? PROSTATE SURGERY    ? due to prostate cancer  ? ? ?There were no vitals filed for this visit. ? ? Subjective Assessment - 03/23/22 1009   ? ? Subjective Pt reported he was not sore after last session.   ? Pertinent History Hx of prostatectomy,  hydrocelectomy, diabetes II,  denied LBP, constipation, pain withurinaiton and bowel movements.  inguinal hernia repair L   ? Patient Stated Goals cure his incontinence   ? ?  ?  ? ?  ? ? ? ? ? [facility] PT Assessment - 03/23/22 1009   ? ?  ? Observation/Other Assessments  ? Observations reported shoulder pain with moist pad under thoracic spine, no pain when support on pillows in semi recline position   ?  ? Coordination  ? Coordination and Movement Description limited anterior excursion of intercostals B, upper trap overuse , delayed depression of scapula B   ?  ? Posture/Postural Control  ? Posture Comments less thoracic kyphosis but forward head still pressent   ?  ? Palpation  ? Spinal mobility hypomobile upper t-spine segments, tightness of upper trap, teres minor/ SA, intercostals B   ?  ? Bed Mobility  ? Bed Mobility --   sit to long sit  ? ?  ?  ? ?  ? ? ? ? ? ? ? ? ? ? ? ? ? Pelvic Floor Special Questions - 03/23/22 0001   ? ? Diastasis Recti less abdominal bulging   ? ?  ?  ? ?  ? ? ? ? [facility] Adult PT Treatment/Exercise - 03/23/22 1009   ? ?  ? Neuro Re-ed   ? Neuro  Re-ed Details  cued for less upper trap overuse, lateral and anterior anteral excursion of ribs to optimize pdeep core   ?  ? Modalities  ? Modalities Moist Heat   instructions to deep core during moist heat  ?  ? Moist Heat Therapy  ? Number Minutes Moist Heat 5 Minutes   ? Moist Heat Location --   thoracic  ?  ? Manual Therapy  ? Manual therapy comments STM/MWM at thoracic  and shoulder to promote more upright posture, optimize excursion of diaphragm   ? ?  ?  ? ?  ? ? ? ? ? ? ? ? ? ? ? ? ? ? ? PT Long Term Goals - 03/08/22 1628   ? ?  ? PT LONG TERM GOAL #1  ? Title Pt will wean off of wearing penile clamp for 2 hours while being on his feet in order to prepare to go in the community activities without the clamp   ? Time 10   ? Period Weeks   ? Status New   ? Target Date 05/17/22   ?  ? PT LONG TERM GOAL #2  ? Title Pt will demo equal pelvic  girdle and shoulder height across 2 weeks in order to minimize knee pain and advance to deep core exericses for continence   ? Time 2   ? Period Weeks   ? Status New   ? Target Date 03/22/22   ?  ? PT LONG TERM GOAL #3  ? Title Pt will demo IND and understanding how to modify his workout routine with weight lifting and demo proper alignment and co-activaiton of deep core system to minimzie SUI   ? Time 10   ? Period Weeks   ? Status New   ? Target Date 05/17/22   ?  ? PT LONG TERM GOAL #4  ? Title Pt will demo less forward head posture/ thoracic kyphosis, separation at linea alba below sternal costal angle and more optimal excursion of diaphragm in order to improve IAP system for continence   ? Time 4   ? Period Weeks   ? Status New   ? Target Date 04/06/22   ?  ? PT LONG TERM GOAL #5  ? Title Pt will  improve > 5 pt changee with FOTO Urinary Problem 46 pts and PFDI Urinary from 46 pts in order to demo imrpve continence and improved QOL   ? Time 10   ? Period Weeks   ? Status New   ? Target Date 05/18/22   ? ?  ?  ? ?  ? ? ? ? ? ? ? ? Plan - 03/23/22 1647   ? ? Clinical Impression Statement Pt showed less thoracic kyphosis but forward head still present. Manual Tx was applied with tolerance. Pt demo'd increased mobility at C/T junction. Anticipate today's improvement will be help with decreasing forward head posture. Pt also showed increased excursion of diaphragm after manual Tx which deemed him ready for deep core training. Pt required cues for less overuse of upper trap. Pt continues to benefit from skilled PT to regain continence. Plan to modify fitness and weight lifting routine at next session.   ? Personal Factors and Comorbidities Fitness   ? Examination-Activity Limitations Continence;Locomotion Level;Stand   ? Stability/Clinical Decision Making Evolving/Moderate complexity   ? Rehab Potential Good   ? PT Frequency 1x / week   ? PT Duration Other (comment)   10  ? PT Treatment/Interventions  Functional  mobility training;Therapeutic activities;Therapeutic exercise;Neuromuscular re-education;Balance training;Manual techniques;Splinting;Taping;Scar mobilization;Moist Heat;Gait training;Cryotherapy;Dry needling;Patient/family education;Spinal Manipulations;Joint Manipulations   ? Consulted and Agree with Plan of Care Patient   ? ?  ?  ? ?  ? ? ?Patient will benefit from skilled therapeutic intervention in order to improve the following deficits and impairments:  Decreased activity tolerance, Decreased endurance, Decreased range of motion, Decreased strength, Hypomobility, Decreased coordination, Decreased mobility, Postural dysfunction, Abnormal gait, Improper body mechanics, Pain, Increased muscle spasms, Decreased safety awareness, Impaired flexibility ? ?Visit Diagnosis: ?Sacrococcygeal disorders, not elsewhere classified ? ?Other abnormalities of gait and mobility ? ?Abnormal posture ? ?Chronic pain of right knee ? ? ? ? ?Problem List ?Patient Active Problem List  ? Diagnosis Date Noted  ? Prostate cancer ([place]) 03/08/2022  ? Hyperlipidemia associated with type 2 diabetes mellitus ([place]) 01/21/2022  ? Absence of bladder continence 04/07/2021  ? Benign essential HTN 01/08/2021  ? Chronic constipation 10/08/2020  ? Skin mass 07/08/2020  ? Mild atherosclerosis of carotid artery, right 05/07/2020  ? Elevated BP without diagnosis of hypertension 04/03/2020  ? Nonrheumatic mitral (valve) insufficiency 11/21/2019  ? Cerebrovascular disease 11/21/2019  ? Pure hypercholesterolemia 11/21/2019  ? Erectile dysfunction 11/21/2019  ? Coronary arteriosclerosis 11/21/2019  ? COPD, mild ([place]) 09/27/2019  ? Type 2 diabetes mellitus without complication, without long-term current use of insulin ([place]) 09/27/2019  ? History of prostate cancer 08/03/2019  ? ? ?Jerl Mina, PT ?03/23/2022, 4:49 PM ? ?[facility] ?[facility] ?[place], [place], [address] ?Phone: [phone]    Fax:  [phone] ? ?Name: TARVIS BLOSSOM ?MRN: [id number] ?Date of Birth: 1956/04/11 ? ? ? ?

## 2022-03-26 DIAGNOSIS — H401132 Primary open-angle glaucoma, bilateral, moderate stage: Secondary | ICD-10-CM | POA: Diagnosis not present

## 2022-03-26 LAB — HM DIABETES EYE EXAM

## 2022-03-30 ENCOUNTER — Ambulatory Visit: Payer: Medicare Other | Attending: Urology | Admitting: Physical Therapy

## 2022-03-30 DIAGNOSIS — G8929 Other chronic pain: Secondary | ICD-10-CM | POA: Diagnosis not present

## 2022-03-30 DIAGNOSIS — R293 Abnormal posture: Secondary | ICD-10-CM

## 2022-03-30 DIAGNOSIS — R2689 Other abnormalities of gait and mobility: Secondary | ICD-10-CM

## 2022-03-30 DIAGNOSIS — M533 Sacrococcygeal disorders, not elsewhere classified: Secondary | ICD-10-CM

## 2022-03-30 DIAGNOSIS — M25561 Pain in right knee: Secondary | ICD-10-CM | POA: Insufficient documentation

## 2022-03-30 NOTE — Patient Instructions (Signed)
PELVIC FLOOR / KEGEL EXERCISES ? ? ?Pelvic floor/ Kegel exercises are used to strengthen the muscles in the base of your pelvis that are responsible for supporting your pelvic organs and preventing urine/feces leakage. Based on your therapist?s recommendations, they can be performed while standing, sitting, or lying down. ? ?Make yourself aware of this muscle group by using these cues: ?Imagine you are in a crowded room and you feel the need to pass gas. Your response is to pull up and in at the rectum. ?Close the rectum. Pull the muscles up inside your body,feeling your scrotum lifting as well . Feel the pelvic floor muscles lift as if you were walking into a cold lake. ?Place your hand on top of your pubic bone. Tighten and draw in the muscles around the anal muscles without squeezing the buttock muscles. ? ?Common Errors: ?Breath holding: If you are holding your breath, you may be bearing down against your bladder instead of pulling it up. If you belly bulges up while you are squeezing, you are holding your breath. Be sure to breathe gently in and out while exercising. Counting out loud may help you avoid holding your breath. ?Accessory muscle use: You should not see or feel other muscle movement when performing pelvic floor exercises. When done properly, no one can tell that you are performing the exercises. Keep the buttocks, belly and inner thighs relaxed. ?Overdoing it: Your muscles can fatigue and stop working for you if you over-exercise. You may actually leak more or feel soreness at the lower abdomen or rectum. ? ? ? ?SHORT HOLDS: Position: on back, ?Inhale and then exhale. Then squeeze the muscle.  (Be sure to let belly sink in with exhales and not push outward) ?Perform 5 repetitions,  ? ?Then do the Deep core level 1  ?Then do the Deep core level 2  ? ?Then do another set of 5 quick pelvic floor squeezes without butt and inner thighs muscles  ?LESS IS MORE  ? ? ? ? ?**ALSO SQUEEZE BEFORE YOUR SNEEZE,  COUGH, LAUGH to decrease downward pressure  ? ?**ALSO EXHALE BEFORE YOU RISE AGAINST GRAVITY (lifting, sit to stand, from squat to stand)  ? ?

## 2022-03-30 NOTE — Therapy (Signed)
[place] ?[place], [place], [address] ?Phone: [phone]   Fax:  [phone] ? ?Physical Therapy Treatment ? ?Patient Details  ?Name: Alexander Dixon ?MRN: [id number] ?Date of Birth: [birth year] ?Referring Provider (PT): Stoiff ? ? ?Encounter Date: 03/30/2022 ? ? PT End of Session - 03/30/22 1555   ? ? Visit Number 4   ? Number of Visits 10   ? Date for PT Re-Evaluation 05/18/22   ? PT Start Time 1500   ? PT Stop Time 1556   ? PT Time Calculation (min) 56 min   ? ?  ?  ? ?  ? ? ?Past Medical History:  ?Diagnosis Date  ? Arthritis   ? right knee  ? 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 FVC 71% and FEV1 73% untreated  ? Diabetes mellitus without complication ([place])   ? History of chicken pox   ? Prostate cancer ([place]) 2020  ? Pure hypercholesterolemia 11/21/2019  ? ? ?Past Surgical History:  ?Procedure Laterality Date  ? COLONOSCOPY WITH PROPOFOL N/A 04/21/2020  ? Procedure: COLONOSCOPY WITH PROPOFOL;  Surgeon: Jonathon Bellows, MD;  Location: [facility] ENDOSCOPY;  Service: Gastroenterology;  Laterality: N/A;  POSITIVE 3/23  ? CONTINUOUS NERVE MONITORING N/A 01/07/2021  ? Procedure: FACIAL NERVE MONITORING;  Surgeon: Carloyn Manner, MD;  Location: [facility];  Service: ENT;  Laterality: N/A;  ? EAR CYST EXCISION Right 01/07/2021  ? Procedure: EXCISION EPIDERMAL INCLUSION CYST;  Surgeon: Carloyn Manner, MD;  Location: [facility];  Service: ENT;  Laterality: Right;  ? HERNIA REPAIR Left   ? inguinal  ? HYDROCELE EXCISION Right 02/09/2022  ? Procedure: HYDROCELECTOMY ADULT;  Surgeon: Abbie Sons, MD;  Location: [facility] ORS;  Service: Urology;  Laterality: Right;  ? KNEE ARTHROSCOPY Right 1984  ? PROSTATE SURGERY    ? due to prostate cancer  ? ? ?There were no vitals filed for this visit. ? ? Subjective Assessment - 03/30/22 1508   ? ? Subjective Pt reported he is noticing he has urge sensation when bladder is full. Pt notices the approach  the Pelvic PT is differnet than the other PTs in the past and this approach is helping more.   ? Pertinent History Hx of prostatectomy, hydrocelectomy, diabetes II,  denied LBP, constipation, pain withurinaiton and bowel movements.  inguinal hernia repair L   ? Patient Stated Goals cure his incontinence   ? ?  ?  ? ?  ? ? ? ? ? [facility] PT Assessment - 03/30/22 1510   ? ?  ? Posture/Postural Control  ? Posture Comments less thoracic kyphosis, forward head   ?  ? Ambulation/Gait  ? Gait Comments reciporcal gait pattern   ? ?  ?  ? ?  ? ? ? ? ? ? ? ? ? ? ? ? ? Pelvic Floor Special Questions - 03/30/22 1602   ? ? Diastasis Recti no abldominal bulging, slight scar restrictions in upper and lower quadrants from surgery   ? External Perineal Exam through clothing: overuse of ab for cue for pelvic floor contraction   tightness at anterior mm  ? External Palpation able to elicit correct technique 5 quick contractions before fatigue   ? ?  ?  ? ?  ? ? ? ? [facility] Adult PT Treatment/Exercise - 03/30/22 1510   ? ?  ? Therapeutic Activites   ? Other Therapeutic Activities provided schedule for kegels and explanation of deep core system to  optimize kegel   ?  ? Neuro Re-ed   ? Neuro Re-ed Details  excessive cues for pelvic tilts and less overuse of adductor/ gluts with quick pelvic floor mm   ?  ? Manual Therapy  ? Manual therapy comments STM/MWM at anterior pelvic floor mmto promote promote lengthening   ? ?  ?  ? ?  ? ? ? ? ? ? ? ? ? ? ? ? ? ? ? PT Long Term Goals - 03/08/22 1628   ? ?  ? PT LONG TERM GOAL #1  ? Title Pt will wean off of wearing penile clamp for 2 hours while being on his feet in order to prepare to go in the community activities without the clamp   ? Time 10   ? Period Weeks   ? Status New   ? Target Date 05/17/22   ?  ? PT LONG TERM GOAL #2  ? Title Pt will demo equal pelvic girdle and shoulder height across 2 weeks in order to minimize knee pain and advance to deep core exericses for continence   ? Time 2   ?  Period Weeks   ? Status New   ? Target Date 03/22/22   ?  ? PT LONG TERM GOAL #3  ? Title Pt will demo IND and understanding how to modify his workout routine with weight lifting and demo proper alignment and co-activaiton of deep core system to minimzie SUI   ? Time 10   ? Period Weeks   ? Status New   ? Target Date 05/17/22   ?  ? PT LONG TERM GOAL #4  ? Title Pt will demo less forward head posture/ thoracic kyphosis, separation at linea alba below sternal costal angle and more optimal excursion of diaphragm in order to improve IAP system for continence   ? Time 4   ? Period Weeks   ? Status New   ? Target Date 04/06/22   ?  ? PT LONG TERM GOAL #5  ? Title Pt will  improve > 5 pt changee with FOTO Urinary Problem 46 pts and PFDI Urinary from 46 pts in order to demo imrpve continence and improved QOL   ? Time 10   ? Period Weeks   ? Status New   ? Target Date 05/18/22   ? ?  ?  ? ?  ? ? ? ? ? ? ? ? Plan - 03/30/22 1604   ? ? Clinical Impression Statement Pt is making improvements with report of increased sensation of urgency and full bladder. Pt demo'd improved posture and no more deviations to spine and pelvic girdle. Pt showed decreased abdominal bulging as pt is compliant with deep core exericses. Progressed to pelvic quick contractions ( 5 reps in hooklying) with cues and after external manual Tx to optimize lengthening of pelvic floor and less use of global mm of adductors/ gluts/ oblique mm. Pt continues to benefit from skilled PT. Plan to modify his workout routine at upcoming sessions.   ? Personal Factors and Comorbidities Fitness   ? Examination-Activity Limitations Continence;Locomotion Level;Stand   ? Stability/Clinical Decision Making Evolving/Moderate complexity   ? Rehab Potential Good   ? PT Frequency 1x / week   ? PT Duration Other (comment)   10  ? PT Treatment/Interventions Functional mobility training;Therapeutic activities;Therapeutic exercise;Neuromuscular re-education;Balance training;Manual  techniques;Splinting;Taping;Scar mobilization;Moist Heat;Gait training;Cryotherapy;Dry needling;Patient/family education;Spinal Manipulations;Joint Manipulations   ? Consulted and Agree with Plan of Care Patient   ? ?  ?  ? ?  ? ? ?  Patient will benefit from skilled therapeutic intervention in order to improve the following deficits and impairments:  Decreased activity tolerance, Decreased endurance, Decreased range of motion, Decreased strength, Hypomobility, Decreased coordination, Decreased mobility, Postural dysfunction, Abnormal gait, Improper body mechanics, Pain, Increased muscle spasms, Decreased safety awareness, Impaired flexibility ? ?Visit Diagnosis: ?Sacrococcygeal disorders, not elsewhere classified ? ?Other abnormalities of gait and mobility ? ?Abnormal posture ? ?Chronic pain of right knee ? ? ? ? ?Problem List ?Patient Active Problem List  ? Diagnosis Date Noted  ? Prostate cancer ([place]) 03/08/2022  ? Hyperlipidemia associated with type 2 diabetes mellitus ([place]) 01/21/2022  ? Absence of bladder continence 04/07/2021  ? Benign essential HTN 01/08/2021  ? Chronic constipation 10/08/2020  ? Skin mass 07/08/2020  ? Mild atherosclerosis of carotid artery, right 05/07/2020  ? Elevated BP without diagnosis of hypertension 04/03/2020  ? Nonrheumatic mitral (valve) insufficiency 11/21/2019  ? Cerebrovascular disease 11/21/2019  ? Pure hypercholesterolemia 11/21/2019  ? Erectile dysfunction 11/21/2019  ? Coronary arteriosclerosis 11/21/2019  ? COPD, mild ([place]) 09/27/2019  ? Type 2 diabetes mellitus without complication, without long-term current use of insulin ([place]) 09/27/2019  ? History of prostate cancer 08/03/2019  ? ? ?Jerl Mina, PT ?03/30/2022, 4:07 PM ? ? ?[facility] ?[place], [place], [address] ?Phone: [phone]   Fax:  [phone] ? ?Name: Alexander Dixon ?MRN: [id number] ?Date of Birth: 1956-04-27 ? ? ? ?

## 2022-04-06 ENCOUNTER — Ambulatory Visit: Payer: Medicare Other | Admitting: Physical Therapy

## 2022-04-06 DIAGNOSIS — R2689 Other abnormalities of gait and mobility: Secondary | ICD-10-CM | POA: Diagnosis not present

## 2022-04-06 DIAGNOSIS — G8929 Other chronic pain: Secondary | ICD-10-CM | POA: Diagnosis not present

## 2022-04-06 DIAGNOSIS — M533 Sacrococcygeal disorders, not elsewhere classified: Secondary | ICD-10-CM | POA: Diagnosis not present

## 2022-04-06 DIAGNOSIS — M25561 Pain in right knee: Secondary | ICD-10-CM | POA: Diagnosis not present

## 2022-04-06 DIAGNOSIS — R293 Abnormal posture: Secondary | ICD-10-CM | POA: Diagnosis not present

## 2022-04-06 NOTE — Therapy (Signed)
[facility] ?[place], [place], [address] ?Phone: [phone]   Fax:  [phone] ? ?Physical Therapy Treatment ? ?Patient Details  ?Name: Alexander Dixon ?MRN: [id number] ?Date of Birth: Jun 26, 1956 ?Referring Provider (Alexander Dixon): Stoiff ? ? ?Encounter Date: 04/06/2022 ? ? Alexander Dixon End of Session - 04/06/22 1812   ? ? Visit Number 5   ? Number of Visits 10   ? Date for Alexander Dixon Re-Evaluation 05/18/22   ? Alexander Dixon Start Time 1410   ? Alexander Dixon Stop Time 1505   ? Alexander Dixon Time Calculation (min) 55 min   ? ?  ?  ? ?  ? ? ?Past Medical History:  ?Diagnosis Date  ? Arthritis   ? right knee  ? 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 FVC 71% and FEV1 73% untreated  ? Diabetes mellitus without complication ([place])   ? History of chicken pox   ? Prostate cancer ([place]) 2020  ? Pure hypercholesterolemia 11/21/2019  ? ? ?Past Surgical History:  ?Procedure Laterality Date  ? COLONOSCOPY WITH PROPOFOL N/A 04/21/2020  ? Procedure: COLONOSCOPY WITH PROPOFOL;  Surgeon: Jonathon Bellows, MD;  Location: [facility] ENDOSCOPY;  Service: Gastroenterology;  Laterality: N/A;  POSITIVE 3/23  ? CONTINUOUS NERVE MONITORING N/A 01/07/2021  ? Procedure: FACIAL NERVE MONITORING;  Surgeon: Carloyn Manner, MD;  Location: [facility];  Service: ENT;  Laterality: N/A;  ? EAR CYST EXCISION Right 01/07/2021  ? Procedure: EXCISION EPIDERMAL INCLUSION CYST;  Surgeon: Carloyn Manner, MD;  Location: [facility];  Service: ENT;  Laterality: Right;  ? HERNIA REPAIR Left   ? inguinal  ? HYDROCELE EXCISION Right 02/09/2022  ? Procedure: HYDROCELECTOMY ADULT;  Surgeon: Abbie Sons, MD;  Location: [facility]S;  Service: Urology;  Laterality: Right;  ? KNEE ARTHROSCOPY Right 1984  ? PROSTATE SURGERY    ? due to prostate cancer  ? ? ?There were no vitals filed for this visit. ? ? Subjective Assessment - 04/06/22 1813   ? ? Subjective Alexander Dixon reported he notices leakage is improving.   ? Pertinent History Hx of prostatectomy,  hydrocelectomy, diabetes II,  denied LBP, constipation, pain withurinaiton and bowel movements.  inguinal hernia repair L   ? ?  ?  ? ?  ? ? ? ? ? [facility] Alexander Dixon Assessment - 04/06/22 1814   ? ?  ? Observation/Other Assessments  ? Observations more upright posture, less forward head   ?  ? Coordination  ? Coordination and Movement Description poor dissassociation of trunk/pelvis with multidifis HEP   ?  ? Other:  ? Other/ Comments bilateral stance with perturbation with use of dumbbells, poor coordination with deep core with amachine use in gymm, excessive overuse of upper trap mm B in lat pull down and new HEP   ? ?  ?  ? ?  ? ? ? ? ? ? ? ? ? ? ? ? ? ? ? ? [facility] Adult Alexander Dixon Treatment/Exercise - 04/06/22 1816   ? ?  ? Therapeutic Activites   ? Other Therapeutic Activities explained role of modifications to his fitness routine to promote more continence   ?  ? Neuro Re-ed   ? Neuro Re-ed Details  cued for long contraction of pelvic floor, seated contraction qquick required cues for less posterior contraction, excessive cues for less upper trap overuse, coordination with mul;tidifis and shoulder HEP with blue band   ? ?  ?  ? ?  ? ? ? ? ? ? ? ? ? ? ? ? ? ? ?  Alexander Dixon Long Term Goals - 03/08/22 1628   ? ?  ? Alexander Dixon LONG TERM GOAL #1  ? Title Alexander Dixon will wean off of wearing penile clamp for 2 hours while being on his feet in order to prepare to go in the community activities without the clamp   ? Time 10   ? Period Weeks   ? Status New   ? Target Date 05/17/22   ?  ? Alexander Dixon LONG TERM GOAL #2  ? Title Alexander Dixon will demo equal pelvic girdle and shoulder height across 2 weeks in order to minimize knee pain and advance to deep core exericses for continence   ? Time 2   ? Period Weeks   ? Status New   ? Target Date 03/22/22   ?  ? Alexander Dixon LONG TERM GOAL #3  ? Title Alexander Dixon will demo IND and understanding how to modify his workout routine with weight lifting and demo proper alignment and co-activaiton of deep core system to minimzie SUI   ? Time 10   ? Period Weeks   ?  Status New   ? Target Date 05/17/22   ?  ? Alexander Dixon LONG TERM GOAL #4  ? Title Alexander Dixon will demo less forward head posture/ thoracic kyphosis, separation at linea alba below sternal costal angle and more optimal excursion of diaphragm in order to improve IAP system for continence   ? Time 4   ? Period Weeks   ? Status New   ? Target Date 04/06/22   ?  ? Alexander Dixon LONG TERM GOAL #5  ? Title Alexander Dixon will  improve > 5 Alexander Dixon changee with FOTO Urinary Problem 46 pts and PFDI Urinary from 46 pts in order to demo imrpve continence and improved QOL   ? Time 10   ? Period Weeks   ? Status New   ? Target Date 05/18/22   ? ?  ?  ? ?  ? ? ? ? ? ? ? ? Plan - 04/06/22 1813   ? ? Clinical Impression Statement Alexander Dixon demo more upright posture and better coordination with deep core. Advanced to long contractions and seated short contractions. Cued for long contraction of pelvic floor, seated contraction qquick required cues for less posterior contraction.  Requied modifications and coordination training with excessive cues for less upper trap overuse, coordination with multidifis and shoulder HEP with blue band, and more stability withco-activation of deep core system with use of dumbbells. Alexander Dixon continues to benefit from skilled Alexander Dixon  ? Personal Factors and Comorbidities Fitness   ? Examination-Activity Limitations Continence;Locomotion Level;Stand   ? Stability/Clinical Decision Making Evolving/Moderate complexity   ? Rehab Potential Good   ? Alexander Dixon Frequency 1x / week   ? Alexander Dixon Duration Other (comment)   10  ? Alexander Dixon Treatment/Interventions Functional mobility training;Therapeutic activities;Therapeutic exercise;Neuromuscular re-education;Balance training;Manual techniques;Splinting;Taping;Scar mobilization;Moist Heat;Gait training;Cryotherapy;Dry needling;Patient/family education;Spinal Manipulations;Joint Manipulations   ? Consulted and Agree with Plan of Care Patient   ? ?  ?  ? ?  ? ? ?Patient will benefit from skilled therapeutic intervention in order to improve the  following deficits and impairments:  Decreased activity tolerance, Decreased endurance, Decreased range of motion, Decreased strength, Hypomobility, Decreased coordination, Decreased mobility, Postural dysfunction, Abnormal gait, Improper body mechanics, Pain, Increased muscle spasms, Decreased safety awareness, Impaired flexibility ? ?Visit Diagnosis: ?Sacrococcygeal disorders, not elsewhere classified ? ?Other abnormalities of gait and mobility ? ?Abnormal posture ? ?Chronic pain of right knee ? ? ? ? ?Problem List ?Patient Active Problem List  ?  Diagnosis Date Noted  ? Prostate cancer ([place]) 03/08/2022  ? Hyperlipidemia associated with type 2 diabetes mellitus ([place]) 01/21/2022  ? Absence of bladder continence 04/07/2021  ? Benign essential HTN 01/08/2021  ? Chronic constipation 10/08/2020  ? Skin mass 07/08/2020  ? Mild atherosclerosis of carotid artery, right 05/07/2020  ? Elevated BP without diagnosis of hypertension 04/03/2020  ? Nonrheumatic mitral (valve) insufficiency 11/21/2019  ? Cerebrovascular disease 11/21/2019  ? Pure hypercholesterolemia 11/21/2019  ? Erectile dysfunction 11/21/2019  ? Coronary arteriosclerosis 11/21/2019  ? COPD, mild ([place]) 09/27/2019  ? Type 2 diabetes mellitus without complication, without long-term current use of insulin ([place]) 09/27/2019  ? History of prostate cancer 08/03/2019  ? ? ?Alexander Dixon, Alexander Dixon ?04/06/2022, 6:21 PM ? ?[facility] ?[place], [place], [address] ?Phone: [phone]   Fax:  [phone] ? ?Name: Alexander Dixon ?MRN: [id number] ?Date of Birth: 06-08-1956 ? ? ? ?

## 2022-04-06 NOTE — Patient Instructions (Signed)
Workout ? ?Stand in ski track stance with dumbbells ? ?Shoulders down with weight machines ?Chin tuck   ?___ ? ?Multifidis twist  ?Band is on doorknob: seated in chair rom door (facing perpendicular)  ? ?Twisting trunk without moving the hips and knees ?Hold band at the level of ribcage, elbows bent,shoulder blades roll back and down like squeezing a pencil under armpit ?  ?Exhale twist,.10-15 deg away from door without moving your hips/ knees, press more weight on the side of the sitting bones/ foot opp of your direction of turn as your counterweight. Continue to maintain equal weight through legs.  Keep knee unlocked.  ?30 reps ? ?__ ? ?Same position in chair ( discontinue cable)  ? ?External rotator for shoulder,  ?Squeeze a towel under elbow , forearm out at 20 deg from side ? ?____ ? ? ?Progression with long kegels: ? ?Morning and night  ? ?5 sec holds count aloud, 2 breaths   -- 4 reps  ?Deep core level 1 n 2  ?5 sec holds count aloud, 2 breaths   -- 4 reps  ? ?Quick kegels 3 x day  ?Breakfast, lunch and dinner in a chair with good posture ?5 sec quick with less effort at anal muscles, focus on lift in the center  ? ? ?

## 2022-04-13 ENCOUNTER — Ambulatory Visit: Payer: Medicare Other | Admitting: Physical Therapy

## 2022-04-13 DIAGNOSIS — R2689 Other abnormalities of gait and mobility: Secondary | ICD-10-CM | POA: Diagnosis not present

## 2022-04-13 DIAGNOSIS — R293 Abnormal posture: Secondary | ICD-10-CM | POA: Diagnosis not present

## 2022-04-13 DIAGNOSIS — M533 Sacrococcygeal disorders, not elsewhere classified: Secondary | ICD-10-CM

## 2022-04-13 DIAGNOSIS — G8929 Other chronic pain: Secondary | ICD-10-CM | POA: Diagnosis not present

## 2022-04-13 DIAGNOSIS — M25561 Pain in right knee: Secondary | ICD-10-CM | POA: Diagnosis not present

## 2022-04-13 NOTE — Patient Instructions (Signed)
? ? ? ?  Transition from standing to floor if you have hypertension or can not have your head lower than your heart  ?Wide squat in front of a CHAIR ? Forearms onto the chair ?Lower knees down into double kneeling  ? floor  ? ?To get up ?Walk on your knees to the front of chair again ?Forearms onto the chair ?Inhale, exhale, pushing onto the forearms to life  ?lifts hips up, kneeping knees bent ?hands on thighs, then hips then pause HERE  to avoid (moving too quickly up/ blood rush) -->  knees glide forward and roll  Hips up instead of hinging spine up ? ? ?* KEEP YOUR HEAD AND HEART LEVELLED , NEVER LETTING YOUR HEAD GET BELOW YOUR HEART ? ?___ ? ?Advance long holds to 5 sec, 5 reps on your back ?Remember to expand at circularly not push the belly on the inhale  ? ? ?

## 2022-04-13 NOTE — Therapy (Signed)
[facility] ?[place], [place], [address] ?Phone: [phone]   Fax:  [phone] ? ?Physical Therapy Treatment ? ?Patient Details  ?Name: Alexander Dixon ?MRN: [id number] ?Date of Birth: 13-Nov-1956 ?Referring Provider (PT): Sto[place] ? ? ?Encounter Date: 04/13/2022 ? ? PT End of Session - 04/13/22 1938   ? ? Visit Number 6   ? Number of Visits 10   ? Date for PT Re-Evaluation 05/18/22   ? PT Start Time 1610   ? PT Stop Time 5852   ? PT Time Calculation (min) 55 min   ? Activity Tolerance Patient tolerated treatment well   ? Behavior During Therapy [facility] for tasks assessed/performed   ? ?  ?  ? ?  ? ? ?Past Medical History:  ?Diagnosis Date  ? Arthritis   ? right knee  ? 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 FVC 71% and FEV1 73% untreated  ? Diabetes mellitus without complication ([place])   ? History of chicken pox   ? Prostate cancer ([place]) 2020  ? Pure hypercholesterolemia 11/21/2019  ? ? ?Past Surgical History:  ?Procedure Laterality Date  ? COLONOSCOPY WITH PROPOFOL N/A 04/21/2020  ? Procedure: COLONOSCOPY WITH PROPOFOL;  Surgeon: Jonathon Bellows, MD;  Location: [facility] ENDOSCOPY;  Service: Gastroenterology;  Laterality: N/A;  POSITIVE 3/23  ? CONTINUOUS NERVE MONITORING N/A 01/07/2021  ? Procedure: FACIAL NERVE MONITORING;  Surgeon: Carloyn Manner, MD;  Location: [place];  Service: ENT;  Laterality: N/A;  ? EAR CYST EXCISION Right 01/07/2021  ? Procedure: EXCISION EPIDERMAL INCLUSION CYST;  Surgeon: Carloyn Manner, MD;  Location: [place];  Service: ENT;  Laterality: Right;  ? HERNIA REPAIR Left   ? inguinal  ? HYDROCELE EXCISION Right 02/09/2022  ? Procedure: HYDROCELECTOMY ADULT;  Surgeon: Abbie Sons, MD;  Location: [facility] ORS;  Service: Urology;  Laterality: Right;  ? KNEE ARTHROSCOPY Right 1984  ? PROSTATE SURGERY    ? due to prostate cancer  ? ? ?There were no vitals filed for this visit. ? ? Subjective Assessment - 04/13/22  1939   ? ? Subjective Pt reported he is doing his pelvic floor contractions. Pt would like to learn how to get up from the floor. Pt has been using less weights and more cable collumn in his fitness . Pt noticed leakage with stting back down. Pt feels his leakage is decreasing.   ? ?  ?  ? ?  ? ? ? ? ? [facility] PT Assessment - 04/13/22 1939   ? ?  ? Floor to Stand  ? Comments limited hamstring mobility,   ?  ? Palpation  ? Spinal mobility limited excursion of anterior ribs , fascial restrictions along flank and lower ribs   ? ?  ?  ? ?  ? ? ? ? ? ? ? ? ? ? ? ? ? Pelvic Floor Special Questions - 04/13/22 1940   ? ? External Perineal Exam through clothing:  hooklying, progressed to 5 sec, 5 reps with cues for less glut overuse   tightness at anterior mm  ? ?  ?  ? ?  ? ? ? ? [facility] Adult PT Treatment/Exercise - 04/13/22 1941   ? ?  ? Therapeutic Activites   ? Other Therapeutic Activities explained endurance program, practiced floor<> stand t/f with pelvic stability to stretch back and hamstrings   ?  ? Neuro Re-ed   ? Neuro Re-ed Details  cued for endurance contraction  of pelvic floor to 5 sec, 5 reps ,   ?  ? Manual Therapy  ? Manual therapy comments fascial releases over flank and back to promote anterior rib /lateral excursion   ? ?  ?  ? ?  ? ? ? ? ? ? ? ? ? ? ? ? ? ? ? PT Long Term Goals - 04/13/22 1617   ? ?  ? PT LONG TERM GOAL #1  ? Title Pt will wean off of wearing penile clamp for 2 hours while being on his feet in order to prepare to go in the community activities without the clamp   ? Time 10   ? Period Weeks   ? Status Partially Met   ? Target Date 05/17/22   ?  ? PT LONG TERM GOAL #2  ? Title Pt will demo equal pelvic girdle and shoulder height across 2 weeks in order to minimize knee pain and advance to deep core exericses for continence   ? Time 2   ? Period Weeks   ? Status Achieved   ? Target Date 03/22/22   ?  ? PT LONG TERM GOAL #3  ? Title Pt will demo IND and understanding how to modify his workout  routine with weight lifting and demo proper alignment and co-activaiton of deep core system to minimzie SUI   ? Time 10   ? Period Weeks   ? Status Achieved   ? Target Date 05/17/22   ?  ? PT LONG TERM GOAL #4  ? Title Pt will demo less forward head posture/ thoracic kyphosis, separation at linea alba below sternal costal angle and more optimal excursion of diaphragm in order to improve IAP system for continence   ? Time 4   ? Period Weeks   ? Status Achieved   ? Target Date 04/06/22   ?  ? PT LONG TERM GOAL #5  ? Title Pt will  improve > 5 pt changee with FOTO Urinary Problem 46 pts and PFDI Urinary from 46 pts in order to demo imrpve continence and improved QOL   ? Time 10   ? Period Weeks   ? Status New   ? Target Date 05/18/22   ? ?  ?  ? ?  ? ? ? ? ? ? ? ? Plan - 04/13/22 1943   ? ? Clinical Impression Statement Pt progressed to endurance pelvic floor strengthening and was explained the program to perform in gravity eliminated position first. Pt required cues for less glut overuse.  ? ?Provided body mechanics training for stand<> floor t/f to optimize mobility of hamstrings and back mm. Pt demo'd correctly post training.  ? ?Pt required manual Tx to continue increasing anterior excursion of diaphragm and required cues for less abdominal pushing with breathing. Anticipate optimal diaphragmatic excursion will continue helping pelvic floor function. Plan to add abdominal strengthening exercises at  next session. Pt continues to benefit from skilled PT  ? Personal Factors and Comorbidities Fitness   ? Examination-Activity Limitations Continence;Locomotion Level;Stand   ? Stability/Clinical Decision Making Evolving/Moderate complexity   ? Rehab Potential Good   ? PT Frequency 1x / week   ? PT Duration Other (comment)   10  ? PT Treatment/Interventions Functional mobility training;Therapeutic activities;Therapeutic exercise;Neuromuscular re-education;Balance training;Manual techniques;Splinting;Taping;Scar  mobilization;Moist Heat;Gait training;Cryotherapy;Dry needling;Patient/family education;Spinal Manipulations;Joint Manipulations   ? Consulted and Agree with Plan of Care Patient   ? ?  ?  ? ?  ? ? ?Patient will benefit  from skilled therapeutic intervention in order to improve the following deficits and impairments:  Decreased activity tolerance, Decreased endurance, Decreased range of motion, Decreased strength, Hypomobility, Decreased coordination, Decreased mobility, Postural dysfunction, Abnormal gait, Improper body mechanics, Pain, Increased muscle spasms, Decreased safety awareness, Impaired flexibility ? ?Visit Diagnosis: ?Sacrococcygeal disorders, not elsewhere classified ? ?Other abnormalities of gait and mobility ? ?Abnormal posture ? ?Chronic pain of right knee ? ? ? ? ?Problem List ?Patient Active Problem List  ? Diagnosis Date Noted  ? Prostate cancer ([place]) 03/08/2022  ? Hyperlipidemia associated with type 2 diabetes mellitus ([place]) 01/21/2022  ? Absence of bladder continence 04/07/2021  ? Benign essential HTN 01/08/2021  ? Chronic constipation 10/08/2020  ? Skin mass 07/08/2020  ? Mild atherosclerosis of carotid artery, right 05/07/2020  ? Elevated BP without diagnosis of hypertension 04/03/2020  ? Nonrheumatic mitral (valve) insufficiency 11/21/2019  ? Cerebrovascular disease 11/21/2019  ? Pure hypercholesterolemia 11/21/2019  ? Erectile dysfunction 11/21/2019  ? Coronary arteriosclerosis 11/21/2019  ? COPD, mild ([place]) 09/27/2019  ? Type 2 diabetes mellitus without complication, without long-term current use of insulin ([place]) 09/27/2019  ? History of prostate cancer 08/03/2019  ? ? ?Jerl Mina, PT ?04/13/2022, 7:44 PM ? ?[facility] ?[place], [place], [address] ?Phone: [phone]   Fax:  [phone] ? ?Name: Alexander Dixon ?MRN: [id number] ?Date of Birth: 1956/08/21 ? ? ? ?

## 2022-04-28 ENCOUNTER — Ambulatory Visit: Payer: Medicare Other | Admitting: Physical Therapy

## 2022-04-28 DIAGNOSIS — R2689 Other abnormalities of gait and mobility: Secondary | ICD-10-CM | POA: Diagnosis not present

## 2022-04-28 DIAGNOSIS — R293 Abnormal posture: Secondary | ICD-10-CM | POA: Diagnosis not present

## 2022-04-28 DIAGNOSIS — M533 Sacrococcygeal disorders, not elsewhere classified: Secondary | ICD-10-CM | POA: Diagnosis not present

## 2022-04-28 DIAGNOSIS — G8929 Other chronic pain: Secondary | ICD-10-CM

## 2022-04-28 DIAGNOSIS — M25561 Pain in right knee: Secondary | ICD-10-CM | POA: Diagnosis not present

## 2022-04-28 NOTE — Patient Instructions (Signed)
Core/ Sit UP alternatives    7.5 lbs weight each on cable collumn  Standing PNF band exercises  Stand 45 deg to doorway, front leg is opp of hand holding band   "Drawing a sword" " Pulling a lawnmover" Band is under feet, hold band in L hand, across body by R pocket Stand with toes, knees, and pelvis turned 45 deg away from the door,  ski track stance, 1st ballmounds down, knee aligned 2-3rd toes.   Press downward into the ballmounds and heels of the feet, thigh muscle active, Keep knees and hips squared the front and do not move them throughout the activity, Exhale to pull band from R pocket,  across the face to the R pocket, rotating only your ribcage to L as if "drawing a sword"  Follow hand with eyes/head, elbow by ribs, arm is kept at 90 deg forearm to upper arm   10 x 2 reps      "Pulling seat belt"   Band over door knob with knot on the other side of the door Stand with toes, knees, and pelvis turned 45 deg away from the door,  ski track stance, 1st ballmounds down, knee aligned 2-3rd toes.  Exhale to pull band from R ear height across body to the L pocket without turning pelvis and knees  Follow hand with eyes/head, elbow by ribs, arm is kept at 90 deg forearm to upper arm    10 x 2  reps each side   _____________   17. 5 lbs each with pushing up hill  Elbows by ribs  Stepping forward 9 leaning like up a hill) Step backwards wider feet , slow  _____________  Blue band under feet with mini squat then elbow straight, pull 30 deg from hips for shoulder abduction ,chin tuck  10 x 2 reps   ___  Dolphin plank by the wall  Forearm in triangle, fist interlaced Min squat, straighten knees, rise foreahead up like dolphin out the water Exhale rise   10 x 2 reps

## 2022-04-28 NOTE — Therapy (Signed)
[facility] [address] [place], [place], [address] Phone: [phone]   Fax:  [phone]  Physical Therapy Treatment  Patient Details  Name: Alexander Dixon MRN: [id number] Date of Birth: 1956-02-01 Referring Provider (PT): Stoiff   Encounter Date: 04/28/2022   PT End of Session - 04/28/22 1012     Visit Number 7    Number of Visits 10    Date for PT Re-Evaluation 05/18/22    PT Start Time 1010    PT Stop Time 1105    PT Time Calculation (min) 55 min    Activity Tolerance Patient tolerated treatment well    Behavior During Therapy [facility] for tasks assessed/performed             Past Medical History:  Diagnosis Date   Arthritis    right knee   COPD, 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   FVC 71% and FEV1 73% untreated   Diabetes mellitus without complication ([place])    History of chicken pox    Prostate cancer ([place]) 2020   Pure hypercholesterolemia 11/21/2019    Past Surgical History:  Procedure Laterality Date   COLONOSCOPY WITH PROPOFOL N/A 04/21/2020   Procedure: COLONOSCOPY WITH PROPOFOL;  Surgeon: Jonathon Bellows, MD;  Location: [facility] ENDOSCOPY;  Service: Gastroenterology;  Laterality: N/A;  POSITIVE 3/23   CONTINUOUS NERVE MONITORING N/A 01/07/2021   Procedure: FACIAL NERVE MONITORING;  Surgeon: Carloyn Manner, MD;  Location: [place];  Service: ENT;  Laterality: N/A;   EAR CYST EXCISION Right 01/07/2021   Procedure: EXCISION EPIDERMAL INCLUSION CYST;  Surgeon: Carloyn Manner, MD;  Location: [facility];  Service: ENT;  Laterality: Right;   HERNIA REPAIR Left    inguinal   HYDROCELE EXCISION Right 02/09/2022   Procedure: HYDROCELECTOMY ADULT;  Surgeon: Abbie Sons, MD;  Location: [facility] ORS;  Service: Urology;  Laterality: Right;   KNEE ARTHROSCOPY Right 1984   PROSTATE SURGERY     due to prostate cancer    There were no vitals filed for this visit.   Subjective Assessment - 04/28/22  1011     Subjective Pt notices leakage with geting in/out of car. Ptis working on his breath during his workout and does not have leakage with working out. Pt would like to learn more to do for his abs instead of crunches.    Pertinent History Hx of prostatectomy, hydrocelectomy, diabetes II,  denied LBP, constipation, pain withurinaiton and bowel movements.  inguinal hernia repair L                [facility] PT Assessment - 04/28/22 1046       Coordination   Coordination and Movement Description stepping backwards with narrow BOS,  scapular abduction 30 deg with lumbar lordosis                           [facility] Adult PT Treatment/Exercise - 04/28/22 1047       Therapeutic Activites    Other Therapeutic Activities explained modifications to core and sit ups and how today's cable collumn HEP and band exericse helps with core in functional positions and correct rounded shoulders      Neuro Re-ed    Neuro Re-ed Details  cued for PNF and mini squats, back stepping  with opp scaption, cued for hand place ment on ribs not at head for optimal pelvic floor contraction/. lengthening  PT Long Term Goals - 04/13/22 1617       PT LONG TERM GOAL #1   Title Pt will wean off of wearing penile clamp for 2 hours while being on his feet in order to prepare to go in the community activities without the clamp    Time 10    Period Weeks    Status Partially Met    Target Date 05/17/22      PT LONG TERM GOAL #2   Title Pt will demo equal pelvic girdle and shoulder height across 2 weeks in order to minimize knee pain and advance to deep core exericses for continence    Time 2    Period Weeks    Status Achieved    Target Date 03/22/22      PT LONG TERM GOAL #3   Title Pt will demo IND and understanding how to modify his workout routine with weight lifting and demo proper alignment and co-activaiton of deep core system to minimzie SUI    Time  10    Period Weeks    Status Achieved    Target Date 05/17/22      PT LONG TERM GOAL #4   Title Pt will demo less forward head posture/ thoracic kyphosis, separation at linea alba below sternal costal angle and more optimal excursion of diaphragm in order to improve IAP system for continence    Time 4    Period Weeks    Status Achieved    Target Date 04/06/22      PT LONG TERM GOAL #5   Title Pt will  improve > 5 pt changee with FOTO Urinary Problem 46 pts and PFDI Urinary from 46 pts in order to demo imrpve continence and improved QOL    Time 10    Period Weeks    Status New    Target Date 05/18/22                   Plan - 04/28/22 1012     Clinical Impression Statement Pt required cues and modification to his core workout to optimize IAP system for continence. Pt was cued for PNF and mini squats, back stepping  with opposite scaption, cued for hand place ment on ribs not at head for optimal pelvic floor contraction/lengthening.  Pt continues to benefit from skilled PT.    Personal Factors and Comorbidities Fitness    Examination-Activity Limitations Continence;Locomotion Level;Stand    Stability/Clinical Decision Making Evolving/Moderate complexity    Rehab Potential Good    PT Frequency 1x / week    PT Duration Other (comment)   10   PT Treatment/Interventions Functional mobility training;Therapeutic activities;Therapeutic exercise;Neuromuscular re-education;Balance training;Manual techniques;Splinting;Taping;Scar mobilization;Moist Heat;Gait training;Cryotherapy;Dry needling;Patient/family education;Spinal Manipulations;Joint Manipulations    Consulted and Agree with Plan of Care Patient             Patient will benefit from skilled therapeutic intervention in order to improve the following deficits and impairments:  Decreased activity tolerance, Decreased endurance, Decreased range of motion, Decreased strength, Hypomobility, Decreased coordination, Decreased  mobility, Postural dysfunction, Abnormal gait, Improper body mechanics, Pain, Increased muscle spasms, Decreased safety awareness, Impaired flexibility  Visit Diagnosis: Sacrococcygeal disorders, not elsewhere classified  Other abnormalities of gait and mobility  Abnormal posture  Chronic pain of right knee     Problem List Patient Active Problem List   Diagnosis Date Noted   Prostate cancer ([place]) 03/08/2022   Hyperlipidemia associated with type 2 diabetes mellitus ([place]) 01/21/2022  Absence of bladder continence 04/07/2021   Benign essential HTN 01/08/2021   Chronic constipation 10/08/2020   Skin mass 07/08/2020   Mild atherosclerosis of carotid artery, right 05/07/2020   Elevated BP without diagnosis of hypertension 04/03/2020   Nonrheumatic mitral (valve) insufficiency 11/21/2019   Cerebrovascular disease 11/21/2019   Pure hypercholesterolemia 11/21/2019   Erectile dysfunction 11/21/2019   Coronary arteriosclerosis 11/21/2019   COPD, mild ([place]) 09/27/2019   Type 2 diabetes mellitus without complication, without long-term current use of insulin ([place]) 09/27/2019   History of prostate cancer 08/03/2019    Jerl Mina, PT 04/28/2022, 10:56 AM  [facility] [address] [place], [place], [address] Phone: [phone]   Fax:  [phone]  Name: Alexander Dixon MRN: [id number] Date of Birth: 1956/01/17

## 2022-05-19 ENCOUNTER — Encounter: Payer: Self-pay | Admitting: Family Medicine

## 2022-05-27 ENCOUNTER — Ambulatory Visit: Payer: Medicare Other | Attending: Urology | Admitting: Physical Therapy

## 2022-05-27 DIAGNOSIS — M533 Sacrococcygeal disorders, not elsewhere classified: Secondary | ICD-10-CM | POA: Diagnosis not present

## 2022-05-27 DIAGNOSIS — R2689 Other abnormalities of gait and mobility: Secondary | ICD-10-CM | POA: Insufficient documentation

## 2022-05-27 DIAGNOSIS — R293 Abnormal posture: Secondary | ICD-10-CM | POA: Insufficient documentation

## 2022-05-27 DIAGNOSIS — M25561 Pain in right knee: Secondary | ICD-10-CM | POA: Diagnosis not present

## 2022-05-27 DIAGNOSIS — G8929 Other chronic pain: Secondary | ICD-10-CM | POA: Insufficient documentation

## 2022-05-28 NOTE — Therapy (Signed)
[place] MAIN [facility] SERVICES [address] [place], [place], [address] Phone: [phone]   Fax:  [phone]  Physical Therapy Treatment / Recertification  Patient Details  Name: Alexander Dixon MRN: [id number] Date of Birth: Nov 19, 1956 Referring Provider (PT): Stoiff   Encounter Date: 05/27/2022   PT End of Session - 05/27/22 1627     Visit Number 8    Number of Visits --    Date for PT Re-Evaluation 08/05/22    PT Start Time 1613    PT Stop Time 1655    PT Time Calculation (min) 42 min    Activity Tolerance Patient tolerated treatment well    Behavior During Therapy [facility] for tasks assessed/performed             Past Medical History:  Diagnosis Date   Arthritis    right knee   COPD, 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   FVC 71% and FEV1 73% untreated   Diabetes mellitus without complication ([place])    History of chicken pox    Prostate cancer ([place]) 2020   Pure hypercholesterolemia 11/21/2019    Past Surgical History:  Procedure Laterality Date   COLONOSCOPY WITH PROPOFOL N/A 04/21/2020   Procedure: COLONOSCOPY WITH PROPOFOL;  Surgeon: Jonathon Bellows, MD;  Location: [facility] ENDOSCOPY;  Service: Gastroenterology;  Laterality: N/A;  POSITIVE 3/23   CONTINUOUS NERVE MONITORING N/A 01/07/2021   Procedure: FACIAL NERVE MONITORING;  Surgeon: Carloyn Manner, MD;  Location: [place];  Service: ENT;  Laterality: N/A;   EAR CYST EXCISION Right 01/07/2021   Procedure: EXCISION EPIDERMAL INCLUSION CYST;  Surgeon: Carloyn Manner, MD;  Location: [place];  Service: ENT;  Laterality: Right;   HERNIA REPAIR Left    inguinal   HYDROCELE EXCISION Right 02/09/2022   Procedure: HYDROCELECTOMY ADULT;  Surgeon: Abbie Sons, MD;  Location: [facility] ORS;  Service: Urology;  Laterality: Right;   KNEE ARTHROSCOPY Right 1984   PROSTATE SURGERY     due to prostate cancer    There were no vitals filed for this visit.   Subjective  Assessment - 05/27/22 1645     Subjective Pt is not consistent with drinking water. Pt is not able to sense full bladder. Pt has noticed his pads are less wet. Pt was able to go without penile clamp across 2 hours.    Pertinent History Hx of prostatectomy, hydrocelectomy, diabetes II,  denied LBP, constipation, pain withurinaiton and bowel movements.  inguinal hernia repair L                [facility] PT Assessment - 05/28/22 1146       Observation/Other Assessments   Observations ankles crossed                        Pelvic Floor Special Questions - 05/28/22 1147     External Perineal Exam through clothing:  hooklying, progressed to 5 sec, 5 reps with cues for less glut overuse   Seated kegels, quick contracitons with more posterior activation              [facility] Adult PT Treatment/Exercise - 05/28/22 1146       Therapeutic Activites    Other Therapeutic Activities discussed pt's decreased sense of bladder fullness and increased urgency with need for bladder schedule retraining, provided chart to track voiding and water intake. Pt was educated on increasing water intake. Reassessed goals      Neuro Re-ed  Neuro Re-ed Details  cuedf for more anterio pelvic floor activation                          PT Long Term Goals - 05/27/22 1614       PT LONG TERM GOAL #1   Title Pt will wean off of wearing penile clamp for 2 hours while being on his feet in order to prepare to go in the community activities without the clamp    Time 10    Period Weeks    Status Achieved    Target Date 05/17/22      PT LONG TERM GOAL #2   Title Pt will demo equal pelvic girdle and shoulder height across 2 weeks in order to minimize knee pain and advance to deep core exericses for continence    Time 2    Period Weeks    Status Achieved    Target Date 03/22/22      PT LONG TERM GOAL #3   Title Pt will demo IND and understanding how to modify his workout routine  with weight lifting and demo proper alignment and co-activaiton of deep core system to minimzie SUI    Time 10    Period Weeks    Status Achieved    Target Date 05/17/22      PT LONG TERM GOAL #4   Title Pt will demo less forward head posture/ thoracic kyphosis, separation at linea alba below sternal costal angle and more optimal excursion of diaphragm in order to improve IAP system for continence    Time 4    Period Weeks    Status Achieved    Target Date 04/06/22      PT LONG TERM GOAL #5   Title Pt will  improve > 5 pt changee with FOTO Urinary Problem 46 pts and PFDI Urinary from 46 pts in order to demo imrpve continence and improved QOL    Baseline 4 pts change for both ( 05/27/22)    Time 10    Period Weeks    Status Partially Met    Target Date 05/18/22      PT LONG TERM GOAL #6   Title Pt will report not  using penile clamp for 3  hours in order to participate in community activity    Baseline at 05/27/22: 2 hour    Time 8    Period Weeks    Status New    Target Date 06/10/22      PT LONG TERM GOAL #7   Title Pt will practice bladder schedule to drink water and times of urination in order to regulate bladder schedule again.    Time 10    Period Weeks    Status New    Target Date 08/05/22                   Plan - 05/27/22 1627     Clinical Impression Statement Pt is able to decrease penile clamp by 2 hours when in the community. Pt achieved 5 /7 goals and benefit from skilled PT to help with urgency and decreased sensation of full bladder and further decrease penile clamp use. Pt demonstrates significantly improved spinal and pelvic alignment and is IND with pelvic floor training which has helped with pt's progress with gradually improving continence. Provided education o bladder retraining and answered pt's questions.        Personal Factors and Comorbidities Fitness  Examination-Activity Limitations Continence;Locomotion Level;Stand     Stability/Clinical Decision Making Evolving/Moderate complexity    Rehab Potential Good    PT Frequency 1x / week    PT Duration Other (comment)   10   PT Treatment/Interventions Functional mobility training;Therapeutic activities;Therapeutic exercise;Neuromuscular re-education;Balance training;Manual techniques;Splinting;Taping;Scar mobilization;Moist Heat;Gait training;Cryotherapy;Dry needling;Patient/family education;Spinal Manipulations;Joint Manipulations    Consulted and Agree with Plan of Care Patient             Patient will benefit from skilled therapeutic intervention in order to improve the following deficits and impairments:  Decreased activity tolerance, Decreased endurance, Decreased range of motion, Decreased strength, Hypomobility, Decreased coordination, Decreased mobility, Postural dysfunction, Abnormal gait, Improper body mechanics, Pain, Increased muscle spasms, Decreased safety awareness, Impaired flexibility  Visit Diagnosis: Sacrococcygeal disorders, not elsewhere classified  Other abnormalities of gait and mobility  Abnormal posture  Chronic pain of right knee     Problem List Patient Active Problem List   Diagnosis Date Noted   Prostate cancer ([place]) 03/08/2022   Hyperlipidemia associated with type 2 diabetes mellitus ([place]) 01/21/2022   Absence of bladder continence 04/07/2021   Benign essential HTN 01/08/2021   Chronic constipation 10/08/2020   Skin mass 07/08/2020   Mild atherosclerosis of carotid artery, right 05/07/2020   Elevated BP without diagnosis of hypertension 04/03/2020   Nonrheumatic mitral (valve) insufficiency 11/21/2019   Cerebrovascular disease 11/21/2019   Pure hypercholesterolemia 11/21/2019   Erectile dysfunction 11/21/2019   Coronary arteriosclerosis 11/21/2019   COPD, mild ([place]) 09/27/2019   Type 2 diabetes mellitus without complication, without long-term current use of insulin ([place]) 09/27/2019   History of prostate cancer  08/03/2019    Jerl Mina, PT 05/28/2022, 11:50 AM  [place], [place], [address] Phone: [phone]   Fax:  [phone]  Name: PHENIX VANDERMEULEN MRN: [id number] Date of Birth: [birth year]

## 2022-05-30 ENCOUNTER — Other Ambulatory Visit: Payer: Self-pay | Admitting: Family Medicine

## 2022-05-30 DIAGNOSIS — I6521 Occlusion and stenosis of right carotid artery: Secondary | ICD-10-CM

## 2022-05-30 DIAGNOSIS — E119 Type 2 diabetes mellitus without complications: Secondary | ICD-10-CM

## 2022-05-30 DIAGNOSIS — E7800 Pure hypercholesterolemia, unspecified: Secondary | ICD-10-CM

## 2022-06-03 ENCOUNTER — Other Ambulatory Visit: Payer: Self-pay | Admitting: Family Medicine

## 2022-06-03 DIAGNOSIS — C61 Malignant neoplasm of prostate: Secondary | ICD-10-CM

## 2022-06-11 NOTE — Telephone Encounter (Signed)
error 

## 2022-06-14 ENCOUNTER — Other Ambulatory Visit: Payer: Medicare Other

## 2022-06-14 DIAGNOSIS — C61 Malignant neoplasm of prostate: Secondary | ICD-10-CM

## 2022-06-15 LAB — PSA: Prostate Specific Ag, Serum: <0.1 ng/mL (ref 0.0–4.0)

## 2022-06-16 ENCOUNTER — Encounter: Payer: Self-pay | Admitting: Urology

## 2022-07-01 ENCOUNTER — Ambulatory Visit: Payer: Medicare Other | Attending: Urology | Admitting: Physical Therapy

## 2022-07-01 DIAGNOSIS — R2689 Other abnormalities of gait and mobility: Secondary | ICD-10-CM | POA: Insufficient documentation

## 2022-07-01 DIAGNOSIS — M533 Sacrococcygeal disorders, not elsewhere classified: Secondary | ICD-10-CM | POA: Insufficient documentation

## 2022-07-01 DIAGNOSIS — M25561 Pain in right knee: Secondary | ICD-10-CM | POA: Diagnosis not present

## 2022-07-01 DIAGNOSIS — G8929 Other chronic pain: Secondary | ICD-10-CM | POA: Insufficient documentation

## 2022-07-01 DIAGNOSIS — R293 Abnormal posture: Secondary | ICD-10-CM | POA: Insufficient documentation

## 2022-07-01 NOTE — Patient Instructions (Addendum)
Pelvic Floor exercise routine            Morning 5 sec holds, 7 reps, lying down  Before the other exercises           Morning 5 sec holds, 7 reps, lying down  After the other exercises                     In the parked car arriving at the gym: 3 sec  holds, 3 reps seated          In the parked car leaving the gym: 3 sec  holds, 3 reps seated                    Arriving home from gym: seated  5 quicks, 7 reps ,  seated           Lunch seated  5 quicks, 7 reps ,  seated          3pm  seated  5 quicks, 7 reps ,  seated                   Night 5 sec holds, 7 reps, lying down  Before the other exercises           Night 5 sec holds, 7 reps, lying down  After the other exercises

## 2022-07-01 NOTE — Therapy (Signed)
[place] MAIN [facility] SERVICES [address] [place], [place], [address] Phone: [phone]   Fax:  [phone]  Physical Therapy Treatment / Discharge Summary   Patient Details  Name: Alexander Dixon MRN: [id number] Date of Birth: 31-Jan-1956 Referring Provider (PT): Stoiff   Encounter Date: 07/01/2022   PT End of Session - 07/01/22 1106     Visit Number 9    Date for PT Re-Evaluation 08/05/22    PT Start Time 1103    PT Stop Time 1145    PT Time Calculation (min) 42 min    Activity Tolerance Patient tolerated treatment well    Behavior During Therapy [facility] for tasks assessed/performed             Past Medical History:  Diagnosis Date   Arthritis    right knee   COPD, 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   FVC 71% and FEV1 73% untreated   Diabetes mellitus without complication ([place])    History of chicken pox    Prostate cancer ([place]) 2020   Pure hypercholesterolemia 11/21/2019    Past Surgical History:  Procedure Laterality Date   COLONOSCOPY WITH PROPOFOL N/A 04/21/2020   Procedure: COLONOSCOPY WITH PROPOFOL;  Surgeon: Jonathon Bellows, MD;  Location: [facility] ENDOSCOPY;  Service: Gastroenterology;  Laterality: N/A;  POSITIVE 3/23   CONTINUOUS NERVE MONITORING N/A 01/07/2021   Procedure: FACIAL NERVE MONITORING;  Surgeon: Carloyn Manner, MD;  Location: [place];  Service: ENT;  Laterality: N/A;   EAR CYST EXCISION Right 01/07/2021   Procedure: EXCISION EPIDERMAL INCLUSION CYST;  Surgeon: Carloyn Manner, MD;  Location: [place];  Service: ENT;  Laterality: Right;   HERNIA REPAIR Left    inguinal   HYDROCELE EXCISION Right 02/09/2022   Procedure: HYDROCELECTOMY ADULT;  Surgeon: Abbie Sons, MD;  Location: [facility] ORS;  Service: Urology;  Laterality: Right;   KNEE ARTHROSCOPY Right 1984   PROSTATE SURGERY     due to prostate cancer    There were no vitals filed for this visit.   Subjective Assessment - 07/01/22  1110     Subjective Pt feels better with working out at the gym. Pt has started to drink more water.    Pertinent History Hx of prostatectomy, hydrocelectomy, diabetes II,  denied LBP, constipation, pain withurinaiton and bowel movements.  inguinal hernia repair L                 [facility] PT Assessment - 07/01/22 1625       Observation/Other Assessments   Observations less forward head posture, less upper trap tightness                [facility] Adult PT Treatment/Exercise - 07/01/22 1629       Therapeutic Activites    Other Therapeutic Activities reassessed goals, administered FOTO, discussed d/c, educated the importance to maintain pelvic floor routine and not to overdo, ensure quality versus quantity with proper form, educated it takes 6- 8 weeks for mm to increase in strength,      Neuro Re-ed    Neuro Re-ed Details  cued for proper pelvic floor activation in seated position                  Plan -     Clinical Impression Statement Pt is able to decrease penile clamp by 2 hours when in the community. Pt is drinking more water and he is more active. Pt is wearing 2 pads per day instead  of 6 per day. Pt is noticing less leakage when lifting weights in the gym. Overall, pt feels improved 70% since [facility].   Pt;s FOTO scores demonstrate significant improvements. Pt's spinal and pelvic deviations have been corrected. Pt is IND with pelvic floor and deep core strengthening program. Pt has returned to his fitness program with understand the ways to have a well-rounded program to minimize worsening of incontinence and maintain upright posture. Pt is ready for d/c.       Personal Factors and Comorbidities Fitness    Examination-Activity Limitations Continence;Locomotion Level;Stand    Stability/Clinical Decision Making Evolving/Moderate complexity    Rehab Potential Good    PT Frequency 1x / week    PT Duration Other (comment)   10   PT Treatment/Interventions Functional  mobility training;Therapeutic activities;Therapeutic exercise;Neuromuscular re-education;Balance training;Manual techniques;Splinting;Taping;Scar mobilization;Moist Heat;Gait training;Cryotherapy;Dry needling;Patient/family education;Spinal Manipulations;Joint Manipulations    Consulted and Agree with Plan of Care Patient             Patient will benefit from skilled therapeutic intervention in order to improve the following deficits and impairments:     Visit Diagnosis: Sacrococcygeal disorders, not elsewhere classified  Other abnormalities of gait and mobility  Abnormal posture  Chronic pain of right knee     Problem List Patient Active Problem List   Diagnosis Date Noted   Prostate cancer ([place]) 03/08/2022   Hyperlipidemia associated with type 2 diabetes mellitus ([place]) 01/21/2022   Absence of bladder continence 04/07/2021   Benign essential HTN 01/08/2021   Chronic constipation 10/08/2020   Skin mass 07/08/2020   Mild atherosclerosis of carotid artery, right 05/07/2020   Elevated BP without diagnosis of hypertension 04/03/2020   Nonrheumatic mitral (valve) insufficiency 11/21/2019   Cerebrovascular disease 11/21/2019   Pure hypercholesterolemia 11/21/2019   Erectile dysfunction 11/21/2019   Coronary arteriosclerosis 11/21/2019   COPD, 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

## 2022-07-19 ENCOUNTER — Ambulatory Visit: Payer: Medicare Other

## 2022-07-23 ENCOUNTER — Ambulatory Visit (INDEPENDENT_AMBULATORY_CARE_PROVIDER_SITE_OTHER): Payer: Medicare Other | Admitting: Family Medicine

## 2022-07-23 VITALS — BP 130/70 | HR 66 | Temp 97.7°F | Ht 70.75 in | Wt 189.5 lb

## 2022-07-23 DIAGNOSIS — E785 Hyperlipidemia, unspecified: Secondary | ICD-10-CM

## 2022-07-23 DIAGNOSIS — E119 Type 2 diabetes mellitus without complications: Secondary | ICD-10-CM

## 2022-07-23 DIAGNOSIS — I10 Essential (primary) hypertension: Secondary | ICD-10-CM

## 2022-07-23 DIAGNOSIS — Z0000 Encounter for general adult medical examination without abnormal findings: Secondary | ICD-10-CM | POA: Diagnosis not present

## 2022-07-23 DIAGNOSIS — E1169 Type 2 diabetes mellitus with other specified complication: Secondary | ICD-10-CM

## 2022-07-23 LAB — COMPREHENSIVE METABOLIC PANEL
ALT: 13 U/L (ref 0–53)
AST: 14 U/L (ref 0–37)
Albumin: 4.5 g/dL (ref 3.5–5.2)
Alkaline Phosphatase: 71 U/L (ref 39–117)
BUN: 21 mg/dL (ref 6–23)
CO2: 31 mEq/L (ref 19–32)
Calcium: 9.6 mg/dL (ref 8.4–10.5)
Chloride: 102 mEq/L (ref 96–112)
Creatinine, Ser: 0.98 mg/dL (ref 0.40–1.50)
GFR: 79.71 mL/min (ref >60.00)
Glucose, Bld: 144 mg/dL — ABNORMAL HIGH (ref 70–99)
Potassium: 4.8 mEq/L (ref 3.5–5.1)
Sodium: 139 mEq/L (ref 135–145)
Total Bilirubin: 0.6 mg/dL (ref 0.2–1.2)
Total Protein: 7.3 g/dL (ref 6.0–8.3)

## 2022-07-23 LAB — LIPID PANEL
Cholesterol: 168 mg/dL (ref 0–200)
HDL: 41.7 mg/dL (ref >39.00)
LDL Cholesterol: 112 mg/dL — ABNORMAL HIGH (ref 0–99)
NonHDL: 126.77
Total CHOL/HDL Ratio: 4
Triglycerides: 72 mg/dL (ref 0.0–149.0)
VLDL: 14.4 mg/dL (ref 0.0–40.0)

## 2022-07-23 LAB — MICROALBUMIN / CREATININE URINE RATIO
Creatinine,U: 140.9 mg/dL
Microalb Creat Ratio: 2.5 mg/g (ref 0.0–30.0)
Microalb, Ur: 3.6 mg/dL — ABNORMAL HIGH (ref 0.0–1.9)

## 2022-07-23 LAB — HEMOGLOBIN A1C: Hgb A1c MFr Bld: 7.5 % — ABNORMAL HIGH (ref 4.6–6.5)

## 2022-07-23 NOTE — Assessment & Plan Note (Signed)
He stopped atorvastatin due to weakness symptoms.  Discussed rechecking fasting cholesterol in 6 months and if elevated considering Crestor or alternative for 2 to 3 days a week as tolerated.

## 2022-07-23 NOTE — Progress Notes (Signed)
Subjective:   Alexander SKIPPER is a 66 y.o. male who presents for Medicare Annual/Subsequent preventive examination.  Review of Systems    Review of Systems  Constitutional:  Negative for chills and fever.  HENT:  Negative for congestion and sore throat.   Eyes:  Negative for blurred vision and double vision.  Respiratory:  Negative for shortness of breath.   Cardiovascular:  Negative for chest pain.  Gastrointestinal:  Negative for heartburn, nausea and vomiting.  Genitourinary: Negative.   Musculoskeletal: Negative.  Negative for myalgias.  Skin:  Negative for rash.  Neurological:  Negative for dizziness and headaches.  Endo/Heme/Allergies:  Does not bruise/bleed easily.  Psychiatric/Behavioral:  Negative for depression. The patient is not nervous/anxious.     Cardiac Risk Factors include: advanced age (>35mn, >>93women);diabetes mellitus;dyslipidemia;male gender     Objective:    Today's Vitals   07/23/22 0859  BP: 130/70  Pulse: 66  Temp: 97.7 F (36.5 C)  TempSrc: Temporal  SpO2: 95%  Weight: 189 lb 8 oz (86 kg)  Height: 5' 10.75" (1.797 m)   Body mass index is 26.62 kg/m.     07/23/2022    9:11 AM 03/08/2022    4:10 PM 02/09/2022   11:07 AM 02/05/2022    9:16 AM 01/30/2021    2:51 PM 01/07/2021    7:36 AM 10/27/2020    1:49 PM  Advanced Directives  Does Patient Have a Medical Advance Directive? _0  No No  Would patient like information on creating a medical advance directive? Yes (MAU/Ambulatory/Procedural Areas - Information given) Yes (MAU/Ambulatory/Procedural Areas - Information given) No - Patient declined No - Patient declined No - Patient declined No - Patient declined No - Patient declined    Current Medications (verified) Outpatient Encounter Medications as of 07/23/2022  Medication Sig   Aromatic Inhalants (VICKS VAPOINHALER) INHA Inhale 1 puff into the lungs daily as needed (congestion).   Blood Glucose Monitoring Suppl (OPTUMRX BLOOD  GLUCOSE METER) w/Device KIT Meter choice per insurance.   Cholecalciferol (VITAMIN D3) 50 MCG (2000 UT) TABS Take 2,000 Units by mouth daily.    glucose blood (ONETOUCH ULTRA) test strip USE TO CHECK BLOOD SUGAR  TWICE DAILY   Ibuprofen-diphenhydrAMINE HCl (ADVIL PM) 200-25 MG CAPS Take 1 capsule by mouth at bedtime as needed (sleep).   Lancets (ONETOUCH DELICA PLUS [id number] MISC USE TO CHECK BLOOD SUGAR TWICE  DAILY   latanoprost (XALATAN) 0.005 % ophthalmic solution Place 1 drop into both eyes at bedtime.    metFORMIN (GLUCOPHAGE) 1000 MG tablet Take 1 tablet (1,000 mg total) by mouth 2 (two) times daily.   Minoxidil (ROGAINE MENS EXTRA STRENGTH) 5 % FOAM Apply 1 application. topically 2 (two) times daily.   Multiple Vitamins-Minerals (HAIR/SKIN/NAILS) CAPS Take 1 capsule by mouth daily.   KRILL OIL PO Take 1,200 mg by mouth daily. (Patient not taking: Reported on 07/23/2022)   sildenafil (VIAGRA) 100 MG tablet Take 1 tablet (100 mg total) by mouth daily as needed for erectile dysfunction. (Patient not taking: Reported on 07/23/2022)   [DISCONTINUED] atorvastatin (LIPITOR) 10 MG tablet TAKE 1 TABLET BY MOUTH DAILY (Patient not taking: Reported on 07/23/2022)   [DISCONTINUED] OVER THE COUNTER MEDICATION Take 1 Container by mouth 2 (two) times daily. celsius live fit vitamin drink   [DISCONTINUED] TURMERIC PO Take 2,000 mg by mouth daily as needed (inflammation).   No facility-administered encounter medications on file as of 07/23/2022.    Allergies (verified) Patient has  no known allergies.   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   FVC 71% and FEV1 73% untreated   Diabetes mellitus without complication ([place])    History of chicken pox    Prostate cancer ([place]) 2020   Pure hypercholesterolemia 11/21/2019   Past Surgical History:  Procedure Laterality Date   COLONOSCOPY WITH PROPOFOL N/A 04/21/2020   Procedure:  COLONOSCOPY WITH PROPOFOL;  Surgeon: Jonathon Bellows, MD;  [facility] END[facility]COPY;  Service: Gastroenterology;  Laterality: N/A;  POSITIVE 3/23   CONTINUOUS NERVE MONITORING N/A 01/07/2021   Procedure: FACIAL NERVE MONITORING;  Surgeon: Carloyn Manner, MD;  Location: [place];  Service: ENT;  Laterality: N/A;   EAR CYST EXCISION Right 01/07/2021   Procedure: EXCISION EPIDERMAL INCLUSION CYST;  Surgeon: Carloyn Manner, MD;  Location: [place];  Service: ENT;  Laterality: Right;   HERNIA REPAIR Left    inguinal   HYDROCELE EXCISION Right 02/09/2022   Procedure: HYDROCELECTOMY ADULT;  Surgeon: Abbie Sons, MD;  Location: [facility] ORS;  Service: Urology;  Laterality: Right;   KNEE ARTHROSCOPY Right 1984   PROSTATE SURGERY     due to prostate cancer   Family History  Problem Relation Age of Onset   Leukemia Mother    Arthritis Mother    Diabetes Mother    Alcohol abuse Father    COPD Father    Arthritis Maternal Grandmother    Diabetes Maternal Grandmother    Hearing loss Maternal Grandmother    Arthritis Maternal Grandfather    Heart attack Maternal Grandfather 52   Diabetes Maternal Grandfather    Heart disease Maternal Grandfather    Hyperlipidemia Maternal Grandfather    Hypertension Maternal Grandfather    Prostate cancer Maternal Grandfather    Prostate cancer Maternal Uncle    Diabetes Maternal Uncle    Prostate cancer Cousin    Diabetes Maternal Uncle    Social History   Socioeconomic History   Marital status: Married    Spouse name: [name]   Number of children: 2   Years of education: associates degree   Highest education level: Not on file  Occupational History   Not on file  Tobacco Use   Smoking status: Former    Packs/day: 1.00    Years: 30.00    Total pack years: 30.00    Types: Cigarettes    Quit date: 12/2000    Years since quitting: 21.5   Smokeless tobacco: Never  Vaping Use   Vaping Use: Never used  Substance and  Sexual Activity   Alcohol use: Not Currently    Comment: once in a blue moon   Drug use: Never   Sexual activity: Yes    Birth control/protection: Post-menopausal, Surgical  Other Topics Concern   Not on file  Social History Narrative   11/05/19   From: yes, born in [place]   Living: with [name]   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      Family: 2 daughters - [name] and [name] - grandchildren - 64 (most are adults)      Enjoys: gym, working around [employer], stay active - shopping      Exercise: 5 days a week   Diet: better than it used to be, avoids meat - lots of veggies and avoids sweets, mostly water      Safety   Seat belts: Yes    Guns: Yes  and secure   Safe in relationships: Yes    Social Determinants of Health   Financial Resource Strain: Low Risk  (01/30/2021)   Overall Financial Resource Strain (CARDIA)    Difficulty of Paying Living Expenses: Not hard at all  Food Insecurity: No Food Insecurity (01/30/2021)   Hunger Vital Sign    Worried About Running Out of Food in the Last Year: Never true    Ran Out of Food in the Last Year: Never true  Transportation Needs: No Transportation Needs (01/30/2021)   PRAPARE - Hydrologist (Medical): No    Lack of Transportation (Non-Medical): No  Physical Activity: Sufficiently Active (01/30/2021)   Exercise Vital Sign    Days of Exercise per Week: 7 days    Minutes of Exercise per Session: 30 min  Stress: No Stress Concern Present (01/30/2021)   [place]    Feeling of Stress : Not at all  Social Connections: Not on file    Tobacco Counseling Counseling given: Not Answered   Clinical Intake:  Pre-visit preparation completed: No  Pain : No/denies pain     BMI - recorded: 26.62 Nutritional Status: BMI 25 -29 Overweight Nutritional Risks: None Diabetes: Yes CBG done?: No Did pt. bring in CBG monitor from home?: Yes Glucose Meter  Downloaded?: No  How often do you need to have someone help you when you read instructions, pamphlets, or other written materials from your doctor or pharmacy?: 1 - Never  Diabetic? yes  Interpreter Needed?: No      Activities of Daily Living    07/23/2022    9:12 AM 02/05/2022    9:18 AM  In your present state of health, do you have any difficulty performing the following activities:  Hearing? 0   Vision? 0   Difficulty concentrating or making decisions? 0   Walking or climbing stairs? 0   Dressing or bathing? 0   Doing errands, shopping? 0 0  Preparing Food and eating ? N   Using the Toilet? N   In the past six months, have you accidently leaked urine? Y   Comment working with pelvic PT   Do you have problems with loss of bowel control? N   Managing your Medications? N   Managing your Finances? N   Housekeeping or managing your Housekeeping? N     Patient Care Team: Lesleigh Noe, MD as PCP - General (Family Medicine)  Indicate any recent Medical Services you may have received from other than [facility] providers in the past year (date may be approximate).     Assessment:   This is a routine wellness examination for [employer].  Hearing/Vision screen Hearing Screening - Comments:: No concerns Vision Screening - Comments:: Has yearly eye exams at [facility]  Dietary issues and exercise activities discussed: Current Exercise Habits: Home exercise routine;Structured exercise class, Type of exercise: strength training/weights;treadmill;Other - see comments (exercise bike), Time (Minutes): 30, Frequency (Times/Week): 5, Weekly Exercise (Minutes/Week): 150, Intensity: Moderate   Goals Addressed             This Visit's Progress    Patient Stated   On track    01/30/2021, I will continue to do cardio everyday for 30 minutes     Patient Stated       Continue exercise at least 5 days a week      Depression Screen    07/23/2022    9:57 AM  01/30/2021    2:52 PM  10/27/2020    1:49 PM 12/26/2019    2:05 PM 11/05/2019    9:49 AM  PHQ 2/9 Scores  PHQ - 2 Score 0 0 0 0 0  PHQ- 9 Score  0  0     Fall Risk    07/23/2022    9:00 AM 01/30/2021    2:52 PM 11/13/2020    9:12 AM 11/06/2020    9:28 AM 10/30/2020    9:18 AM  Fall Risk   Falls in the past year? 0 0 0 0 0  Number falls in past yr: 0 0     Injury with Fall?  0     Risk for fall due to : No Fall Risks Medication side effect     Follow up  Falls evaluation completed;Falls prevention discussed       FALL RISK PREVENTION PERTAINING TO THE HOME:  Any stairs in or around the home? Yes  If so, are there any without handrails? No  Home free of loose throw rugs in walkways, pet beds, electrical cords, etc? Yes  Adequate lighting in your home to reduce risk of falls? Yes   ASSISTIVE DEVICES UTILIZED TO PREVENT FALLS:  Life alert? No  Use of a cane, walker or w/c? No  Grab bars in the bathroom? Yes  Shower chair or bench in shower? No  Elevated toilet seat or a handicapped toilet? No    Cognitive Function:    01/30/2021    2:55 PM 12/26/2019    2:08 PM  MMSE - Mini Mental State Exam  Not completed: Refused   Orientation to time  5  Orientation to Place  5  Registration  3  Attention/ Calculation  5  Recall  3  Language- repeat  1        Immunizations Immunization History  Administered Date(s) Administered   Tdap 09/24/2019    TDAP status: Up to date  Flu Vaccine status: Due, Education has been provided regarding the importance of this vaccine. Advised may receive this vaccine at local pharmacy or Health Dept. Aware to provide a copy of the vaccination record if obtained from local pharmacy or Health Dept. Verbalized acceptance and understanding.  Pneumococcal vaccine status: Declined,  Education has been provided regarding the importance of this vaccine but patient still declined. Advised may receive this vaccine at local pharmacy or Health Dept. Aware to provide a copy of the  vaccination record if obtained from local pharmacy or Health Dept. Verbalized acceptance and understanding.   Covid-19 vaccine status: Information provided on how to obtain vaccines.   Qualifies for Shingles Vaccine? Yes   Zostavax completed No   Shingrix Completed?: No.    Education has been provided regarding the importance of this vaccine. Patient has been advised to call insurance company to determine out of pocket expense if they have not yet received this vaccine. Advised may also receive vaccine at local pharmacy or Health Dept. Verbalized acceptance and understanding.  Screening Tests Health Maintenance  Topic Date Due   COVID-19 Vaccine (1) Never done   Zoster Vaccines- Shingrix (1 of 2) Never done   Pneumonia Vaccine 54+ Years old (1 - PCV) Never done   URINE MICROALBUMIN  04/07/2022   HEMOGLOBIN A1C  07/21/2022   OPHTHALMOLOGY EXAM  03/27/2023   FOOT EXAM  07/24/2023   COLONOSCOPY (Pts 45-11yr Insurance coverage will need to be confirmed)  04/21/2025   TETANUS/TDAP  09/23/2029   Hepatitis  C Screening  Completed   HPV VACCINES  Aged Out   INFLUENZA VACCINE  Discontinued    Health Maintenance  Health Maintenance Due  Topic Date Due   COVID-19 Vaccine (1) Never done   Zoster Vaccines- Shingrix (1 of 2) Never done   Pneumonia Vaccine 69+ Years old (1 - PCV) Never done   URINE MICROALBUMIN  04/07/2022   HEMOGLOBIN A1C  07/21/2022    Colorectal cancer screening: Type of screening: Colonoscopy. Completed 2021. Repeat every 5 years  Lung Cancer Screening: (Low Dose CT Chest recommended if Age 40-80 years, 30 pack-year currently smoking OR have quit w/in 15years.) does not qualify.   Lung Cancer Screening Referral: n/a  Additional Screening:  Hepatitis C Screening: does qualify; Completed 2021  Vision Screening: Recommended annual ophthalmology exams for early detection of glaucoma and other disorders of the eye. Is the patient up to date with their annual eye exam?   Yes    Dental Screening: Recommended annual dental exams for proper oral hygiene  Community Resource Referral / Chronic Care Management: CRR required this visit?  No   CCM required this visit?  No      Plan:   Problem List Items Addressed This Visit       Cardiovascular and Mediastinum   Benign essential HTN   Relevant Orders   Comprehensive metabolic panel     Endocrine   Type 2 diabetes mellitus without complication, without long-term current use of insulin ([place])   Relevant Orders   Hemoglobin A1c   Microalbumin / creatinine urine ratio   Hyperlipidemia associated with type 2 diabetes mellitus ([place])    He stopped atorvastatin due to weakness symptoms.  Discussed rechecking fasting cholesterol in 6 months and if elevated considering Crestor or alternative for 2 to 3 days a week as tolerated.      Relevant Orders   Comprehensive metabolic panel   Lipid panel   Other Visit Diagnoses     Encounter for Medicare annual wellness exam    -  Primary         I have personally reviewed and noted the following in the patient's chart:   Medical and social history Use of alcohol, tobacco or illicit drugs  Current medications and supplements including opioid prescriptions. Patient is not currently taking opioid prescriptions. Functional ability and status Nutritional status Physical activity Advanced directives List of other physicians Hospitalizations, surgeries, and ER visits in previous 12 months Vitals Screenings to include cognitive, depression, and falls Referrals and appointments  In addition, I have reviewed and discussed with patient certain preventive protocols, quality metrics, and best practice recommendations. A written personalized care plan for preventive services as well as general preventive health recommendations were provided to patient.     Lesleigh Noe, MD   07/23/2022

## 2022-07-23 NOTE — Patient Instructions (Signed)
Diabetes - continue healthy diet - labs today - return in 4-6 months  Cholesterol - continue healthy diet - return in 4-6 months for fasting labs

## 2022-07-26 ENCOUNTER — Telehealth: Payer: Self-pay | Admitting: Family Medicine

## 2022-07-26 DIAGNOSIS — E1169 Type 2 diabetes mellitus with other specified complication: Secondary | ICD-10-CM

## 2022-07-26 MED ORDER — ROSUVASTATIN CALCIUM 5 MG PO TABS: 5.0000 mg | ORAL_TABLET | ORAL | 0 refills | Status: DC

## 2022-07-26 NOTE — Telephone Encounter (Signed)
Great!  Please let pt know that I will send in Crestor and advise him to take 3 times per week to start.   If tolerating without side effects after 1 month can try increasing to 4 times a week or daily if he would like.

## 2022-07-26 NOTE — Telephone Encounter (Signed)
Patient called in and moved appointment up with Dr.Letvak. He also wanted to let Dr. Einar Pheasant know that he will try cholesterol medicine again. Thank you !

## 2022-07-26 NOTE — Telephone Encounter (Signed)
Pt.notified

## 2022-08-03 ENCOUNTER — Ambulatory Visit (INDEPENDENT_AMBULATORY_CARE_PROVIDER_SITE_OTHER): Payer: Medicare Other

## 2022-08-03 VITALS — Wt 189.0 lb

## 2022-08-03 DIAGNOSIS — Z0000 Encounter for general adult medical examination without abnormal findings: Secondary | ICD-10-CM | POA: Diagnosis not present

## 2022-08-03 DIAGNOSIS — Z1211 Encounter for screening for malignant neoplasm of colon: Secondary | ICD-10-CM

## 2022-08-03 NOTE — Progress Notes (Signed)
Virtual Visit via Telephone Note  I connected with  [name] on 08/03/22 at  2:15 PM EDT by telephone and verified that I am speaking with the correct person using two identifiers.  Location: Patient: home Provider: Prien Persons participating in the virtual visit: [place]   I discussed the limitations, risks, security and privacy concerns of performing an evaluation and management service by telephone and the availability of in person appointments. The patient expressed understanding and agreed to proceed.  Interactive audio and video telecommunications were attempted between this nurse and patient, however failed, due to patient having technical difficulties OR patient did not have access to video capability.  We continued and completed visit with audio only.  Some vital signs may be absent or patient reported.   Dionisio David, LPN  Subjective:   JA OHMAN is a 66 y.o. male who presents for Medicare Annual/Subsequent preventive examination.  Review of Systems     Cardiac Risk Factors include: advanced age (>8mn, >>54women);diabetes mellitus;dyslipidemia;male gender     Objective:    There were no vitals filed for this visit. There is no height or weight on file to calculate BMI.     08/03/2022    2:17 PM 07/23/2022    9:11 AM 03/08/2022    4:10 PM 02/09/2022   11:07 AM 02/05/2022    9:16 AM 01/30/2021    2:51 PM 01/07/2021    7:36 AM  Advanced Directives  Does Patient Have a Medical Advance Directive? No No No No No No No  Would patient like information on creating a medical advance directive? No - Patient declined Yes (MAU/Ambulatory/Procedural Areas - Information given) Yes (MAU/Ambulatory/Procedural Areas - Information given) No - Patient declined No - Patient declined No - Patient declined No - Patient declined    Current Medications (verified) Outpatient Encounter Medications as of 08/03/2022  Medication Sig    Aromatic Inhalants (VICKS VAPOINHALER) INHA Inhale 1 puff into the lungs daily as needed (congestion).   Blood Glucose Monitoring Suppl (OPTUMRX BLOOD GLUCOSE METER) w/Device KIT Meter choice per insurance.   Cholecalciferol (VITAMIN D3) 50 MCG (2000 UT) TABS Take 2,000 Units by mouth daily.    glucose blood (ONETOUCH ULTRA) test strip USE TO CHECK BLOOD SUGAR  TWICE DAILY   Ibuprofen-diphenhydrAMINE HCl (ADVIL PM) 200-25 MG CAPS Take 1 capsule by mouth at bedtime as needed (sleep).   Lancets (ONETOUCH DELICA PLUS [id number] MISC USE TO CHECK BLOOD SUGAR TWICE  DAILY   latanoprost (XALATAN) 0.005 % ophthalmic solution Place 1 drop into both eyes at bedtime.    metFORMIN (GLUCOPHAGE) 1000 MG tablet Take 1 tablet (1,000 mg total) by mouth 2 (two) times daily.   Minoxidil (ROGAINE MENS EXTRA STRENGTH) 5 % FOAM Apply 1 application. topically 2 (two) times daily.   Multiple Vitamins-Minerals (HAIR/SKIN/NAILS) CAPS Take 1 capsule by mouth daily.   rosuvastatin (CRESTOR) 5 MG tablet Take 1 tablet (5 mg total) by mouth 3 (three) times a week.   sildenafil (VIAGRA) 100 MG tablet Take 1 tablet (100 mg total) by mouth daily as needed for erectile dysfunction.   KRILL OIL PO Take 1,200 mg by mouth daily. (Patient not taking: Reported on 07/23/2022)   No facility-administered encounter medications on file as of 08/03/2022.    Allergies (verified) Patient has no known allergies.   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  FVC 71% and FEV1 73% untreated   Diabetes mellitus without complication ([facility])    History of chicken pox    Prostate cancer ([place]) 2020   Pure hypercholesterolemia 11/21/2019   Past Surgical History:  Procedure Laterality Date   COLONOSCOPY WITH PROPOFOL N/A 04/21/2020   Procedure: COLONOSCOPY WITH PROPOFOL;  Surgeon: Jonathon Bellows, MD;  Location: [facility] ENDOSCOPY;  Service: Gastroenterology;  Laterality: N/A;  POSITIVE 3/23    CONTINUOUS NERVE MONITORING N/A 01/07/2021   Procedure: FACIAL NERVE MONITORING;  Surgeon: Carloyn Manner, MD;  Location: [facility];  Service: ENT;  Laterality: N/A;   EAR CYST EXCISION Right 01/07/2021   Procedure: EXCISION EPIDERMAL INCLUSION CYST;  Surgeon: Carloyn Manner, MD;  Location: [facility];  Service: ENT;  Laterality: Right;   HERNIA REPAIR Left    inguinal   HYDROCELE EXCISION Right 02/09/2022   Procedure: HYDROCELECTOMY ADULT;  Surgeon: Abbie Sons, MD;  Location: [facility] ORS;  Service: Urology;  Laterality: Right;   KNEE ARTHROSCOPY Right 1984   PROSTATE SURGERY     due to prostate cancer   Family History  Problem Relation Age of Onset   Leukemia Mother    Arthritis Mother    Diabetes Mother    Alcohol abuse Father    COPD Father    Arthritis Maternal Grandmother    Diabetes Maternal Grandmother    Hearing loss Maternal Grandmother    Arthritis Maternal Grandfather    Heart attack Maternal Grandfather 52   Diabetes Maternal Grandfather    Heart disease Maternal Grandfather    Hyperlipidemia Maternal Grandfather    Hypertension Maternal Grandfather    Prostate cancer Maternal Grandfather    Prostate cancer Maternal Uncle    Diabetes Maternal Uncle    Prostate cancer Cousin    Diabetes Maternal Uncle    Social History   Socioeconomic History   Marital status: Married    Spouse name: [name]   Number of children: 2   Years of education: associates degree   Highest education level: Not on file  Occupational History   Not on file  Tobacco Use   Smoking status: Former    Packs/day: 1.00    Years: 30.00    Total pack years: 30.00    Types: Cigarettes    Quit date: 12/2000    Years since quitting: 21.6   Smokeless tobacco: Never  Vaping Use   Vaping Use: Never used  Substance and Sexual Activity   Alcohol use: Not Currently    Comment: once in a blue moon   Drug use: Never   Sexual activity: Yes    Birth  control/protection: Post-menopausal, Surgical  Other Topics Concern   Not on file  Social History Narrative   11/05/19   From: yes, born in [place]   Living: with [name]   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      Family: 2 daughters - Programmer, applications and [name] - grandchildren - 46 (most are adults)      Enjoys: gym, working around [employer], stay active - shopping      Exercise: 5 days a week   Diet: better than it used to be, avoids meat - lots of veggies and avoids sweets, mostly water      Safety   Seat belts: Yes    Guns: Yes  and secure   Safe in relationships: Yes    Social Determinants of Health   Financial Resource Strain: Low Risk  (08/03/2022)   Overall Financial  Resource Strain (CARDIA)    Difficulty of Paying Living Expenses: Not hard at all  Food Insecurity: No Food Insecurity (08/03/2022)   Hunger Vital Sign    Worried About Running Out of Food in the Last Year: Never true    Ran Out of Food in the Last Year: Never true  Transportation Needs: No Transportation Needs (08/03/2022)   PRAPARE - Hydrologist (Medical): No    Lack of Transportation (Non-Medical): No  Physical Activity: Sufficiently Active (08/03/2022)   Exercise Vital Sign    Days of Exercise per Week: 5 days    Minutes of Exercise per Session: 60 min  Stress: No Stress Concern Present (08/03/2022)   [place]    Feeling of Stress : Not at all  Social Connections: Moderately Isolated (08/03/2022)   Social Connection and Isolation Panel [NHANES]    Frequency of Communication with Friends and Family: More than three times a week    Frequency of Social Gatherings with Friends and Family: More than three times a week    Attends Religious Services: Never    Marine scientist or Organizations: No    Attends Music therapist: Never    Marital Status: Married    Tobacco Counseling Counseling given: Not  Answered   Clinical Intake:  Pre-visit preparation completed: Yes  Pain : No/denies pain     Nutritional Risks: None Diabetes: Yes CBG done?: No Did pt. bring in CBG monitor from home?: No Glucose Meter Downloaded?: Yes  How often do you need to have someone help you when you read instructions, pamphlets, or other written materials from your doctor or pharmacy?: 1 - Never  Diabetic?yes Nutrition Risk Assessment:  Has the patient had any N/V/D within the last 2 months?  No  Does the patient have any non-healing wounds?  No  Has the patient had any unintentional weight loss or weight gain?  No   Diabetes:  Is the patient diabetic?  Yes  If diabetic, was a CBG obtained today?  No  Did the patient bring in their glucometer from home?  No  How often do you monitor your CBG's? daily.   Financial Strains and Diabetes Management:  Are you having any financial strains with the device, your supplies or your medication? No .  Does the patient want to be seen by Chronic Care Management for management of their diabetes?  No  Would the patient like to be referred to a Nutritionist or for Diabetic Management?  No   Diabetic Exams:  Diabetic Eye Exam: Completed 03/26/22. Marland Kitchen Pt has been advised about the importance in completing this exam.  Diabetic Foot Exam: Completed 07/23/22. Pt has been advised about the importance in completing this exam.   Interpreter Needed?: No  Information entered by :: Kirke Shaggy, LPN   Activities of Daily Living    08/03/2022    2:18 PM 07/23/2022    9:12 AM  In your present state of health, do you have any difficulty performing the following activities:  Hearing? 0 0  Vision? 0 0  Difficulty concentrating or making decisions? 0 0  Walking or climbing stairs? 0 0  Dressing or bathing? 0 0  Doing errands, shopping? 0 0  Preparing Food and eating ? N N  Using the Toilet? N N  In the past six months, have you accidently leaked urine? N Y  Comment   working with pelvic  PT  Do you have problems with loss of bowel control? N N  Managing your Medications? N N  Managing your Finances? N N  Housekeeping or managing your Housekeeping? N N    Patient Care Team: Lesleigh Noe, MD as PCP - General (Family Medicine)  Indicate any recent Medical Services you may have received from other than [facility] providers in the past year (date may be approximate).     Assessment:   This is a routine wellness examination for [employer].  Hearing/Vision screen Hearing Screening - Comments:: No aids Vision Screening - Comments:: Readers- [facility]  Dietary issues and exercise activities discussed: Current Exercise Habits: Home exercise routine, Type of exercise: walking;strength training/weights, Time (Minutes): 60, Frequency (Times/Week): 5, Weekly Exercise (Minutes/Week): 300, Intensity: Moderate   Goals Addressed             This Visit's Progress    DIET - EAT MORE FRUITS AND VEGETABLES         Depression Screen    08/03/2022    2:14 PM 07/23/2022    9:57 AM 01/30/2021    2:52 PM 10/27/2020    1:49 PM 12/26/2019    2:05 PM 11/05/2019    9:49 AM  PHQ 2/9 Scores  PHQ - 2 Score 0 0 0 0 0 0  PHQ- 9 Score 0  0  0     Fall Risk    08/03/2022    2:18 PM 07/23/2022    9:00 AM 01/30/2021    2:52 PM 11/13/2020    9:12 AM 11/06/2020    9:28 AM  Fall Risk   Falls in the past year? 0 0 0 0 0  Number falls in past yr: 0 0 0    Injury with Fall? 0  0    Risk for fall due to : No Fall Risks No Fall Risks Medication side effect    Follow up Falls prevention discussed  Falls evaluation completed;Falls prevention discussed      FALL RISK PREVENTION PERTAINING TO THE HOME:  Any stairs in or around the home? No  If so, are there any without handrails? No  Home free of loose throw rugs in walkways, pet beds, electrical cords, etc? Yes  Adequate lighting in your home to reduce risk of falls? Yes   ASSISTIVE DEVICES UTILIZED TO PREVENT FALLS:  Life  alert? No  Use of a cane, walker or w/c? No  Grab bars in the bathroom? Yes  Shower chair or bench in shower? Yes  Elevated toilet seat or a handicapped toilet? Yes    Cognitive Function:    01/30/2021    2:55 PM 12/26/2019    2:08 PM  MMSE - Mini Mental State Exam  Not completed: Refused   Orientation to time  5  Orientation to Place  5  Registration  3  Attention/ Calculation  5  Recall  3  Language- repeat  1        08/03/2022    2:21 PM  6CIT Screen  What Year? 0 points  What month? 0 points  What time? 0 points  Count back from 20 0 points  Months in reverse 0 points  Repeat phrase 0 points  Total Score 0 points    Immunizations Immunization History  Administered Date(s) Administered   Tdap 09/24/2019    TDAP status: Up to date  Flu Vaccine status: Declined, Education has been provided regarding the importance of this vaccine but patient still declined. Advised  may receive this vaccine at local pharmacy or Health Dept. Aware to provide a copy of the vaccination record if obtained from local pharmacy or Health Dept. Verbalized acceptance and understanding.  Pneumococcal vaccine status: Declined,  Education has been provided regarding the importance of this vaccine but patient still declined. Advised may receive this vaccine at local pharmacy or Health Dept. Aware to provide a copy of the vaccination record if obtained from local pharmacy or Health Dept. Verbalized acceptance and understanding.   Covid-19 vaccine status: Declined, Education has been provided regarding the importance of this vaccine but patient still declined. Advised may receive this vaccine at local pharmacy or Health Dept.or vaccine clinic. Aware to provide a copy of the vaccination record if obtained from local pharmacy or Health Dept. Verbalized acceptance and understanding.  Qualifies for Shingles Vaccine? Yes   Zostavax completed No   Shingrix Completed?: No.    Education has been provided  regarding the importance of this vaccine. Patient has been advised to call insurance company to determine out of pocket expense if they have not yet received this vaccine. Advised may also receive vaccine at local pharmacy or Health Dept. Verbalized acceptance and understanding.  Screening Tests Health Maintenance  Topic Date Due   COVID-19 Vaccine (1) Never done   Zoster Vaccines- Shingrix (1 of 2) Never done   Pneumonia Vaccine 45+ Years old (1 - PCV) Never done   HEMOGLOBIN A1C  01/23/2023   OPHTHALMOLOGY EXAM  03/27/2023   FOOT EXAM  07/24/2023   URINE MICROALBUMIN  07/24/2023   COLONOSCOPY (Pts 45-73yr Insurance coverage will need to be confirmed)  04/21/2025   TETANUS/TDAP  09/23/2029   Hepatitis C Screening  Completed   HPV VACCINES  Aged Out   INFLUENZA VACCINE  Discontinued    Health Maintenance  Health Maintenance Due  Topic Date Due   COVID-19 Vaccine (1) Never done   Zoster Vaccines- Shingrix (1 of 2) Never done   Pneumonia Vaccine 68 Years old (1 - PCV) Never done    Colorectal cancer screening: Type of screening: Colonoscopy. Completed 04/21/20. Repeat every 5 years- referral sent, last colonoscopy he was not clean enough  Lung Cancer Screening: (Low Dose CT Chest recommended if Age 68-80years, 30 pack-year currently smoking OR have quit w/in 15years.) does not qualify.   Additional Screening:  Hepatitis C Screening: does qualify; Completed 12/27/19  Vision Screening: Recommended annual ophthalmology exams for early detection of glaucoma and other disorders of the eye. Is the patient up to date with their annual eye exam?  Yes  Who is the provider or what is the name of the office in which the patient attends annual [place] exams[place] [facility] is not established with a provider, would they like to be referred to a provider to establish care? No .   Dental Screening: Recommended annual dental exams for proper oral hygiene  Community Resource Referral /  Chronic Care Management: CRR required this visit?  No   CCM required this visit?  No      Plan:     I have personally reviewed and noted the following in the patient's chart:   Medical and social history Use of alcohol, tobacco or illicit drugs  Current medications and supplements including opioid prescriptions. Patient is not currently taking opioid prescriptions. Functional ability and status Nutritional status Physical activity Advanced directives List of other physicians Hospitalizations, surgeries, and ER visits in previous 12 months Vitals Screenings to include cognitive, depression, and  falls Referrals and appointments  In addition, I have reviewed and discussed with patient certain preventive protocols, quality metrics, and best practice recommendations. A written personalized care plan for preventive services as well as general preventive health recommendations were provided to patient.     Dionisio David, LPN   02/28/7407   Nurse Notes: none

## 2022-08-03 NOTE — Patient Instructions (Signed)
Alexander Dixon , Thank you for taking time to come for your Medicare Wellness Visit. I appreciate your ongoing commitment to your health goals. Please review the following plan we discussed and let me know if I can assist you in the future.   Screening recommendations/referrals: Colonoscopy: 04/21/20, referral sent since last one was not cleaned out good, never been redone Recommended yearly ophthalmology/optometry visit for glaucoma screening and checkup Recommended yearly dental visit for hygiene and checkup  Vaccinations: Influenza vaccine: n/d Pneumococcal vaccine: n/d Tdap vaccine: 09/24/19 Shingles vaccine: n/d   Covid-19: n/d  Advanced directives: no  Conditions/risks identified: none  Next appointment: Follow up in one year for your annual wellness visit. 08/05/23 @ 1:15 pm by phone  Preventive Care 65 Years and Older, Male Preventive care refers to lifestyle choices and visits with your health care provider that can promote health and wellness. What does preventive care include? A yearly physical exam. This is also called an annual well check. Dental exams once or twice a year. Routine eye exams. Ask your health care provider how often you should have your eyes checked. Personal lifestyle choices, including: Daily care of your teeth and gums. Regular physical activity. Eating a healthy diet. Avoiding tobacco and drug use. Limiting alcohol use. Practicing safe sex. Taking low doses of aspirin every day. Taking vitamin and mineral supplements as recommended by your health care provider. What happens during an annual well check? The services and screenings done by your health care provider during your annual well check will depend on your age, overall health, lifestyle risk factors, and family history of disease. Counseling  Your health care provider may ask you questions about your: Alcohol use. Tobacco use. Drug use. Emotional well-being. Home and relationship  well-being. Sexual activity. Eating habits. History of falls. Memory and ability to understand (cognition). Work and work Statistician. Screening  You may have the following tests or measurements: Height, weight, and BMI. Blood pressure. Lipid and cholesterol levels. These may be checked every 5 years, or more frequently if you are over 83 years old. Skin check. Lung cancer screening. You may have this screening every year starting at age 37 if you have a 30-pack-year history of smoking and currently smoke or have quit within the past 15 years. Fecal occult blood test (FOBT) of the stool. You may have this test every year starting at age 25. Flexible sigmoidoscopy or colonoscopy. You may have a sigmoidoscopy every 5 years or a colonoscopy every 10 years starting at age 17. Prostate cancer screening. Recommendations will vary depending on your family history and other risks. Hepatitis C blood test. Hepatitis B blood test. Sexually transmitted disease (STD) testing. Diabetes screening. This is done by checking your blood sugar (glucose) after you have not eaten for a while (fasting). You may have this done every 1-3 years. Abdominal aortic aneurysm (AAA) screening. You may need this if you are a current or former smoker. Osteoporosis. You may be screened starting at age 14 if you are at high risk. Talk with your health care provider about your test results, treatment options, and if necessary, the need for more tests. Vaccines  Your health care provider may recommend certain vaccines, such as: Influenza vaccine. This is recommended every year. Tetanus, diphtheria, and acellular pertussis (Tdap, Td) vaccine. You may need a Td booster every 10 years. Zoster vaccine. You may need this after age 29. Pneumococcal 13-valent conjugate (PCV13) vaccine. One dose is recommended after age 19. Pneumococcal polysaccharide (PPSV23) vaccine. One  dose is recommended after age 28. Talk to your health care  provider about which screenings and vaccines you need and how often you need them. This information is not intended to replace advice given to you by your health care provider. Make sure you discuss any questions you have with your health care provider. Document Released: 12/12/2015 Document Revised: 08/04/2016 Document Reviewed: 09/16/2015 Elsevier Interactive Patient Education  2017 [place] Prevention in the Home Falls can cause injuries. They can happen to people of all ages. There are many things you can do to make your home safe and to help prevent falls. What can I do on the outside of my home? Regularly fix the edges of walkways and driveways and fix any cracks. Remove anything that might make you trip as you walk through a door, such as a raised step or threshold. Trim any bushes or trees on the path to your home. Use bright outdoor lighting. Clear any walking paths of anything that might make someone trip, such as rocks or tools. Regularly check to see if handrails are loose or broken. Make sure that both sides of any steps have handrails. Any raised decks and porches should have guardrails on the edges. Have any leaves, snow, or ice cleared regularly. Use sand or salt on walking paths during winter. Clean up any spills in your garage right away. This includes oil or grease spills. What can I do in the bathroom? Use night lights. Install grab bars by the toilet and in the tub and shower. Do not use towel bars as grab bars. Use non-skid mats or decals in the tub or shower. If you need to sit down in the shower, use a plastic, non-slip stool. Keep the floor dry. Clean up any water that spills on the floor as soon as it happens. Remove soap buildup in the tub or shower regularly. Attach bath mats securely with double-sided non-slip rug tape. Do not have throw rugs and other things on the floor that can make you trip. What can I do in the bedroom? Use night lights. Make  sure that you have a light by your bed that is easy to reach. Do not use any sheets or blankets that are too big for your bed. They should not hang down onto the floor. Have a firm chair that has side arms. You can use this for support while you get dressed. Do not have throw rugs and other things on the floor that can make you trip. What can I do in the kitchen? Clean up any spills right away. Avoid walking on wet floors. Keep items that you use a lot in easy-to-reach places. If you need to reach something above you, use a strong step stool that has a grab bar. Keep electrical cords out of the way. Do not use floor polish or wax that makes floors slippery. If you must use wax, use non-skid floor wax. Do not have throw rugs and other things on the floor that can make you trip. What can I do with my stairs? Do not leave any items on the stairs. Make sure that there are handrails on both sides of the stairs and use them. Fix handrails that are broken or loose. Make sure that handrails are as long as the stairways. Check any carpeting to make sure that it is firmly attached to the stairs. Fix any carpet that is loose or worn. Avoid having throw rugs at the top or bottom of the  stairs. If you do have throw rugs, attach them to the floor with carpet tape. Make sure that you have a light switch at the top of the stairs and the bottom of the stairs. If you do not have them, ask someone to add them for you. What else can I do to help prevent falls? Wear shoes that: Do not have high heels. Have rubber bottoms. Are comfortable and fit you well. Are closed at the toe. Do not wear sandals. If you use a stepladder: Make sure that it is fully opened. Do not climb a closed stepladder. Make sure that both sides of the stepladder are locked into place. Ask someone to hold it for you, if possible. Clearly mark and make sure that you can see: Any grab bars or handrails. First and last steps. Where the  edge of each step is. Use tools that help you move around (mobility aids) if they are needed. These include: Canes. Walkers. Scooters. Crutches. Turn on the lights when you go into a dark area. Replace any light bulbs as soon as they burn out. Set up your furniture so you have a clear path. Avoid moving your furniture around. If any of your floors are uneven, fix them. If there are any pets around you, be aware of where they are. Review your medicines with your doctor. Some medicines can make you feel dizzy. This can increase your chance of falling. Ask your doctor what other things that you can do to help prevent falls. This information is not intended to replace advice given to you by your health care provider. Make sure you discuss any questions you have with your health care provider. Document Released: 09/11/2009 Document Revised: 04/22/2016 Document Reviewed: 12/20/2014 Elsevier Interactive Patient Education  2017 [employer].

## 2022-08-04 ENCOUNTER — Telehealth: Payer: Self-pay

## 2022-08-04 NOTE — Telephone Encounter (Signed)
Pt has been contacted in regards to his colonoscopy referral.  He stated that he would like to check with his insurance prior to scheduling.  I informed him that Medicare does not require a Prior Authorization and his colonoscopy would be entered as a screening colonoscopy since the one in 2021 was incomplete due to poor prep.  He said he will call them prior to scheduling.  Thanks,  [place], [place]

## 2022-08-06 ENCOUNTER — Encounter: Payer: Medicare Other | Admitting: Physical Therapy

## 2022-08-12 ENCOUNTER — Other Ambulatory Visit: Payer: Self-pay

## 2022-08-12 ENCOUNTER — Telehealth: Payer: Self-pay

## 2022-08-12 DIAGNOSIS — Z1211 Encounter for screening for malignant neoplasm of colon: Secondary | ICD-10-CM

## 2022-08-12 MED ORDER — CLENPIQ 10-3.5-12 MG-GM -GM/160ML PO SOLN: 1.0000 | Freq: Once | ORAL | 0 refills | Status: AC

## 2022-08-12 NOTE — Telephone Encounter (Signed)
Gastroenterology Pre-Procedure Review  Request Date: 09/20/22 Requesting Physician: Dr. Vicente Males  PATIENT REVIEW QUESTIONS: The patient responded to the following health history questions as indicated:    1. Are you having any GI issues? no GI Issues.  poor prep 04/21/20 colonoscopy prep changed from Suprep to [facility] for this colonoscopy 2. Do you have a personal history of Polyps? no 3. Do you have a family history of Colon Cancer or Polyps? no 4. Diabetes Mellitus? Yes patient has been advised to hold metformin 2 days prior to colonoscopy 5. Joint replacements in the past 12 months?no 6. Major health problems in the past 3 months?no 7. Any artificial heart valves, MVP, or defibrillator?no    MEDICATIONS & ALLERGIES:    Patient reports the following regarding taking any anticoagulation/antiplatelet therapy:   Plavix, Coumadin, Eliquis, Xarelto, Lovenox, Pradaxa, Brilinta, or Effient? no Aspirin? no  Patient confirms/reports the following medications:  Current Outpatient Medications  Medication Sig Dispense Refill   Aromatic Inhalants (VICKS VAPOINHALER) INHA Inhale 1 puff into the lungs daily as needed (congestion).     Blood Glucose Monitoring Suppl (OPTUMRX BLOOD GLUCOSE METER) w/Device KIT Meter choice per insurance. 1 kit 1   Cholecalciferol (VITAMIN D3) 50 MCG (2000 UT) TABS Take 2,000 Units by mouth daily.      glucose blood (ONETOUCH ULTRA) test strip USE TO CHECK BLOOD SUGAR  TWICE DAILY 200 strip 3   Ibuprofen-diphenhydrAMINE HCl (ADVIL PM) 200-25 MG CAPS Take 1 capsule by mouth at bedtime as needed (sleep).     KRILL OIL PO Take 1,200 mg by mouth daily. (Patient not taking: Reported on 07/23/2022)     Lancets (ONETOUCH DELICA PLUS ELFYBO17P) MISC USE TO CHECK BLOOD SUGAR TWICE  DAILY 200 each 3   latanoprost (XALATAN) 0.005 % ophthalmic solution Place 1 drop into both eyes at bedtime.      metFORMIN (GLUCOPHAGE) 1000 MG tablet Take 1 tablet (1,000 mg total) by mouth 2 (two)  times daily. 180 tablet 3   Minoxidil (ROGAINE MENS EXTRA STRENGTH) 5 % FOAM Apply 1 application. topically 2 (two) times daily.     Multiple Vitamins-Minerals (HAIR/SKIN/NAILS) CAPS Take 1 capsule by mouth daily.     rosuvastatin (CRESTOR) 5 MG tablet Take 1 tablet (5 mg total) by mouth 3 (three) times a week. 36 tablet 0   sildenafil (VIAGRA) 100 MG tablet Take 1 tablet (100 mg total) by mouth daily as needed for erectile dysfunction. 30 tablet 5   No current facility-administered medications for this visit.    Patient confirms/reports the following allergies:  No Known Allergies  No orders of the defined types were placed in this encounter.   AUTHORIZATION INFORMATION Primary Insurance: 1D#: Group #:  Secondary Insurance: 1D#: Group #:  SCHEDULE INFORMATION: Date: 09/20/22 Time: Location: [place]

## 2022-08-25 ENCOUNTER — Ambulatory Visit (INDEPENDENT_AMBULATORY_CARE_PROVIDER_SITE_OTHER): Payer: Medicare Other | Admitting: Family Medicine

## 2022-08-25 ENCOUNTER — Encounter: Payer: Self-pay | Admitting: Family Medicine

## 2022-08-25 VITALS — BP 120/60 | HR 70 | Temp 97.0°F | Wt 189.2 lb

## 2022-08-25 DIAGNOSIS — E1169 Type 2 diabetes mellitus with other specified complication: Secondary | ICD-10-CM

## 2022-08-25 DIAGNOSIS — J0141 Acute recurrent pansinusitis: Secondary | ICD-10-CM | POA: Diagnosis not present

## 2022-08-25 DIAGNOSIS — E785 Hyperlipidemia, unspecified: Secondary | ICD-10-CM | POA: Diagnosis not present

## 2022-08-25 DIAGNOSIS — R051 Acute cough: Secondary | ICD-10-CM | POA: Diagnosis not present

## 2022-08-25 MED ORDER — AMOXICILLIN-POT CLAVULANATE 875-125 MG PO TABS: 1.0000 | ORAL_TABLET | Freq: Two times a day (BID) | ORAL | 0 refills | Status: AC

## 2022-08-25 MED ORDER — BENZONATATE 100 MG PO CAPS: 100.0000 mg | ORAL_CAPSULE | Freq: Three times a day (TID) | ORAL | 0 refills | Status: DC | PRN

## 2022-08-25 NOTE — Progress Notes (Signed)
Subjective:     Alexander Dixon is a 66 y.o. male presenting for Cough (X 2 weeks, nasal congestion, fever in the first couple days. )     Cough This is a new problem. The current episode started 1 to 4 weeks ago. Associated symptoms include headaches and nasal congestion. Pertinent negatives include no chest pain, chills, ear congestion, ear pain, fever, sore throat or shortness of breath. The symptoms are aggravated by lying down. He has tried nothing for the symptoms.     Review of Systems  Constitutional:  Negative for chills and fever.  HENT:  Positive for congestion, sinus pressure and sinus pain. Negative for ear pain and sore throat.   Respiratory:  Positive for cough. Negative for shortness of breath.   Cardiovascular:  Negative for chest pain.  Neurological:  Positive for headaches.     Social History   Tobacco Use  Smoking Status Former   Packs/day: 1.00   Years: 30.00   Total pack years: 30.00   Types: Cigarettes   Quit date: 12/2000   Years since quitting: 21.6  Smokeless Tobacco Never        Objective:    BP Readings from Last 3 Encounters:  08/25/22 120/60  07/23/22 130/70  03/15/22 137/69   Wt Readings from Last 3 Encounters:  08/25/22 189 lb 4 oz (85.8 kg)  08/03/22 189 lb (85.7 kg)  07/23/22 189 lb 8 oz (86 kg)    BP 120/60   Pulse 70   Temp (!) 97 F (36.1 C) (Temporal)   Wt 189 lb 4 oz (85.8 kg)   SpO2 95%   BMI 26.58 kg/m    Physical Exam Constitutional:      General: He is not in acute distress.    Appearance: He is well-developed. He is not ill-appearing.  HENT:     Head: Normocephalic and atraumatic.     Right Ear: Tympanic membrane and ear canal normal.     Left Ear: Tympanic membrane and ear canal normal.     Nose: Mucosal edema and rhinorrhea present.     Right Sinus: No maxillary sinus tenderness or frontal sinus tenderness.     Left Sinus: No maxillary sinus tenderness or frontal sinus tenderness.      Mouth/Throat:     Pharynx: Uvula midline. Posterior oropharyngeal erythema present. No oropharyngeal exudate.     Tonsils: 0 on the right. 0 on the left.  Cardiovascular:     Rate and Rhythm: Normal rate and regular rhythm.     Heart sounds: No murmur heard. Pulmonary:     Effort: Pulmonary effort is normal. No respiratory distress.     Breath sounds: Normal breath sounds.  Musculoskeletal:     Cervical back: Neck supple.  Lymphadenopathy:     Cervical: No cervical adenopathy.  Skin:    General: Skin is warm and dry.     Capillary Refill: Capillary refill takes less than 2 seconds.  Neurological:     Mental Status: He is alert.           Assessment & Plan:   Problem List Items Addressed This Visit       Endocrine   Hyperlipidemia associated with type 2 diabetes mellitus ([place])    Tolerating rosuvastatin 5 mg three times a week.       Other Visit Diagnoses     Acute recurrent pansinusitis    -  Primary   Relevant Medications   amoxicillin-clavulanate (AUGMENTIN) 875-125 MG  tablet   benzonatate (TESSALON PERLES) 100 MG capsule   Acute cough       Relevant Medications   benzonatate (TESSALON PERLES) 100 MG capsule      Symptoms >2 weeks consistent with possible sinus infection. Treat w/ abx. Discussed cough could linger. Pt with COPD but no wheezing on exam to suggest exacerbation at this time.   Return if symptoms worsen or fail to improve.  Lesleigh Noe, MD

## 2022-08-25 NOTE — Assessment & Plan Note (Signed)
Tolerating rosuvastatin 5 mg three times a week.

## 2022-08-25 NOTE — Patient Instructions (Signed)
Based on your symptoms, it looks like you have a virus.   Antibiotics are not need for a viral infection but the following will help:   Drink plenty of fluids Get lots of rest  Sinus Congestion 1) Neti Pot (Saline rinse) -- 2 times day -- if tolerated 2) Flonase (Store Brand ok) - once daily 3) Over the counter congestion medications  Cough 1) Cough drops can be helpful 2) Nyquil (or nighttime cough medication) 3) Honey is proven to be one of the best cough medications  4) Cough medicine with Dextromethorphan can also be helpful  Sore Throat 1) Honey as above, cough drops 2) Ibuprofen or Aleve can be helpful 3) Salt water Gargles  If you develop fevers (Temperature >100.4), chills, worsening symptoms or symptoms lasting longer than 10 days return to clinic.

## 2022-09-14 ENCOUNTER — Other Ambulatory Visit: Payer: Self-pay

## 2022-09-14 DIAGNOSIS — E119 Type 2 diabetes mellitus without complications: Secondary | ICD-10-CM

## 2022-09-14 MED ORDER — ONETOUCH ULTRA VI STRP: ORAL_STRIP | 3 refills | Status: DC

## 2022-09-23 DIAGNOSIS — H401132 Primary open-angle glaucoma, bilateral, moderate stage: Secondary | ICD-10-CM | POA: Diagnosis not present

## 2022-09-27 ENCOUNTER — Encounter: Payer: Self-pay | Admitting: Gastroenterology

## 2022-09-27 ENCOUNTER — Ambulatory Visit
Admission: RE | Admit: 2022-09-27 | Discharge: 2022-09-27 | Disposition: A | Discharge status: Home / Self Care | Payer: Medicare Other | Attending: Gastroenterology | Admitting: Gastroenterology

## 2022-09-27 ENCOUNTER — Ambulatory Visit: Payer: Medicare Other | Admitting: Anesthesiology

## 2022-09-27 ENCOUNTER — Encounter
Admission: RE | Disposition: A | Discharge status: Home / Self Care | Payer: Self-pay | Source: Home / Self Care | Attending: Gastroenterology

## 2022-09-27 ENCOUNTER — Other Ambulatory Visit: Payer: Self-pay

## 2022-09-27 DIAGNOSIS — I2510 Atherosclerotic heart disease of native coronary artery without angina pectoris: Secondary | ICD-10-CM | POA: Insufficient documentation

## 2022-09-27 DIAGNOSIS — Z1211 Encounter for screening for malignant neoplasm of colon: Secondary | ICD-10-CM

## 2022-09-27 DIAGNOSIS — D124 Benign neoplasm of descending colon: Secondary | ICD-10-CM | POA: Insufficient documentation

## 2022-09-27 DIAGNOSIS — E7800 Pure hypercholesterolemia, unspecified: Secondary | ICD-10-CM | POA: Diagnosis not present

## 2022-09-27 DIAGNOSIS — Z8619 Personal history of other infectious and parasitic diseases: Secondary | ICD-10-CM | POA: Diagnosis not present

## 2022-09-27 DIAGNOSIS — E119 Type 2 diabetes mellitus without complications: Secondary | ICD-10-CM | POA: Diagnosis not present

## 2022-09-27 DIAGNOSIS — Z8546 Personal history of malignant neoplasm of prostate: Secondary | ICD-10-CM | POA: Insufficient documentation

## 2022-09-27 DIAGNOSIS — K5730 Diverticulosis of large intestine without perforation or abscess without bleeding: Secondary | ICD-10-CM | POA: Insufficient documentation

## 2022-09-27 DIAGNOSIS — D126 Benign neoplasm of colon, unspecified: Secondary | ICD-10-CM | POA: Diagnosis not present

## 2022-09-27 DIAGNOSIS — I10 Essential (primary) hypertension: Secondary | ICD-10-CM | POA: Insufficient documentation

## 2022-09-27 DIAGNOSIS — J449 Chronic obstructive pulmonary disease, unspecified: Secondary | ICD-10-CM | POA: Diagnosis not present

## 2022-09-27 DIAGNOSIS — K635 Polyp of colon: Secondary | ICD-10-CM | POA: Diagnosis not present

## 2022-09-27 DIAGNOSIS — Z87891 Personal history of nicotine dependence: Secondary | ICD-10-CM | POA: Insufficient documentation

## 2022-09-27 DIAGNOSIS — Z7984 Long term (current) use of oral hypoglycemic drugs: Secondary | ICD-10-CM | POA: Diagnosis not present

## 2022-09-27 DIAGNOSIS — K5790 Diverticulosis of intestine, part unspecified, without perforation or abscess without bleeding: Secondary | ICD-10-CM | POA: Diagnosis not present

## 2022-09-27 HISTORY — PX: COLONOSCOPY WITH PROPOFOL: SHX5780

## 2022-09-27 LAB — GLUCOSE, CAPILLARY: Glucose-Capillary: 127 mg/dL — ABNORMAL HIGH (ref 70–99)

## 2022-09-27 SURGERY — COLONOSCOPY WITH PROPOFOL
Anesthesia: General

## 2022-09-27 MED ORDER — PROPOFOL 10 MG/ML IV BOLUS
INTRAVENOUS | Status: DC | PRN
  Administered 2022-09-27: 20 mg via INTRAVENOUS
  Administered 2022-09-27: 70 mg via INTRAVENOUS

## 2022-09-27 MED ORDER — PROPOFOL 500 MG/50ML IV EMUL
INTRAVENOUS | Status: DC | PRN
  Administered 2022-09-27: 150 ug/kg/min via INTRAVENOUS

## 2022-09-27 MED ORDER — LIDOCAINE HCL (CARDIAC) PF 100 MG/5ML IV SOSY
PREFILLED_SYRINGE | INTRAVENOUS | Status: DC | PRN
  Administered 2022-09-27: 50 mg via INTRAVENOUS

## 2022-09-27 MED ORDER — SODIUM CHLORIDE 0.9 % IV SOLN: INTRAVENOUS | Status: DC

## 2022-09-27 NOTE — Op Note (Signed)
[facility] Gastroenterology Patient Name: Alexander Dixon Procedure Date: 09/27/2022 9:06 AM MRN: [id number] Account #: [id number] Date of Birth: September 10, 1956 Admit Type: Outpatient Age: 66 Room: [facility] ENDO ROOM 1 Gender: Male Note Status: Finalized Instrument Name: Jasper Riling [id number] Procedure:             Colonoscopy Indications:           Screening for colorectal malignant neoplasm Providers:             Jonathon Bellows MD, MD Referring MD:          Jobe Marker. Einar Pheasant (Referring MD) Medicines:             Monitored Anesthesia Care Complications:         No immediate complications. Procedure:             Pre-Anesthesia Assessment:                        - Prior to the procedure, a History and Physical was                         performed, and patient medications, allergies and                         sensitivities were reviewed. The patient's tolerance                         of previous anesthesia was reviewed.                        - The risks and benefits of the procedure and the                         sedation options and risks were discussed with the                         patient. All questions were answered and informed                         consent was obtained.                        - ASA Grade Assessment: II - A patient with mild                         systemic disease.                        After obtaining informed consent, the colonoscope was                         passed under direct vision. Throughout the procedure,                         the patient's blood pressure, pulse, and oxygen                         saturations were monitored continuously. The                         Colonoscope was introduced  through the anus and                         advanced to the the cecum, identified by the                         appendiceal orifice. The colonoscopy was performed                         with ease. The patient tolerated the procedure well.                          The quality of the bowel preparation was poor. Findings:      The perianal and digital rectal examinations were normal.      A 5 mm polyp was found in the descending colon. The polyp was sessile.       The polyp was removed with a cold snare. Resection and retrieval were       complete.      Multiple medium-mouthed diverticula were found in the sigmoid colon.      The exam was otherwise without abnormality. Impression:            - Preparation of the colon was poor.                        - One 5 mm polyp in the descending colon, removed with                         a cold snare. Resected and retrieved.                        - Diverticulosis in the sigmoid colon.                        - The examination was otherwise normal. Recommendation:        - Discharge patient to home (with escort).                        - Resume previous diet.                        - Continue present medications.                        - Await pathology results.                        - Repeat colonoscopy in 6 months because the bowel                         preparation was suboptimal. Procedure Code(s):     --- Professional ---                        [phone], Colonoscopy, flexible; with removal of                         tumor(s), polyp(s), or other lesion(s) by snare  technique Diagnosis Code(s):     --- Professional ---                        Z12.11, Encounter for screening for malignant neoplasm                         of colon                        D12.4, Benign neoplasm of descending colon                        K57.30, Diverticulosis of large intestine without                         perforation or abscess without bleeding CPT copyright 2022 American Medical Association. All rights reserved. The codes documented in this report are preliminary and upon coder review may  be revised to meet current compliance requirements. Jonathon Bellows, MD Jonathon Bellows MD,  MD 09/27/2022 9:35:21 AM This report has been signed electronically. Number of Addenda: 0 Note Initiated On: 09/27/2022 9:06 AM Scope Withdrawal Time: 0 hours 9 minutes 1 second  Total Procedure Duration: 0 hours 14 minutes 11 seconds  Estimated Blood Loss:  Estimated blood loss: none.      [facility]

## 2022-09-27 NOTE — Anesthesia Preprocedure Evaluation (Addendum)
Anesthesia Evaluation  Patient identified by MRN, date of birth, ID band Patient awake    Reviewed: Allergy & Precautions, NPO status , Patient's Chart, lab work & pertinent test results  History of Anesthesia Complications Negative for: history of anesthetic complications  Airway Mallampati: I  TM Distance: >3 FB Neck ROM: Full    Dental no notable dental hx. (+) Teeth Intact   Pulmonary neg sleep apnea, COPD, Patient abstained from smoking.Not current smoker, former smoker,    Pulmonary exam normal breath sounds clear to auscultation       Cardiovascular Exercise Tolerance: Good METShypertension, Pt. on medications + CAD  (-) Past MI (-) dysrhythmias  Rhythm:Regular Rate:Normal - Systolic murmurs Nonrheumatic mitral (valve) insufficiency Mild atherosclerosis of carotid artery, right   Neuro/Psych negative neurological ROS  negative psych ROS   GI/Hepatic neg GERD  ,(+)     (-) substance abuse  ,   Endo/Other  diabetes  Renal/GU negative Renal ROS     Musculoskeletal   Abdominal   Peds  Hematology   Anesthesia Other Findings Past Medical History: No date: Arthritis     Comment:  right knee 09/27/2019: COPD, mild (HCC)     Comment:  FVC 71% and FEV1 73% untreated No date: Diabetes mellitus without complication (HCC) No date: History of chicken pox No date: Prostate cancer ([place]) 11/21/2019: Pure hypercholesterolemia  Reproductive/Obstetrics                                                               Anesthesia Evaluation  Patient identified by MRN, date of birth, ID band Patient awake    Reviewed: Allergy & Precautions, NPO status , Patient's Chart, lab work & pertinent test results  History of Anesthesia Complications Negative for: history of anesthetic complications  Airway Mallampati: II  TM Distance: >3 FB Neck ROM: Full    Dental no notable dental hx.     Pulmonary neg sleep apnea, COPD,  COPD inhaler, former smoker,    Pulmonary exam normal breath sounds clear to auscultation- rhonchi (-) wheezing      Cardiovascular Exercise Tolerance: Good (-) hypertension(-) CAD, (-) Past MI, (-) Cardiac Stents and (-) CABG Normal cardiovascular exam Rhythm:Regular Rate:Normal - Systolic murmurs and - Diastolic murmurs    Neuro/Psych neg Seizures negative neurological ROS  negative psych ROS   GI/Hepatic negative GI ROS, Neg liver ROS,   Endo/Other  diabetes, Oral Hypoglycemic Agents  Renal/GU negative Renal ROS     Musculoskeletal negative musculoskeletal ROS (+)   Abdominal Normal abdominal exam  (+) - obese,   Peds  Hematology negative hematology ROS (+)   Anesthesia Other Findings Past Medical History: No date: Diabetes mellitus without complication (HCC) No date: History of chicken pox No date: Prostate cancer (HCC)   Reproductive/Obstetrics                             Anesthesia Physical  Anesthesia Plan  ASA: III  Anesthesia Plan: General   Post-op Pain Management:    Induction: Intravenous  PONV Risk Score and Plan: 1 and Propofol infusion  Airway Management Planned: LMA  Additional Equipment:   Intra-op Plan:   Post-operative Plan:   Informed Consent: I have reviewed the patients  History and Physical, chart, labs and discussed the procedure including the risks, benefits and alternatives for the proposed anesthesia with the patient or authorized representative who has indicated his/her understanding and acceptance.     Dental advisory given  Plan Discussed with: CRNA and Surgeon  Anesthesia Plan Comments: (Avoid long acting paralytic for facial nerve monitoring)       Anesthesia Quick Evaluation  Patient Active Problem List   Diagnosis Date Noted  . Prostate cancer ([place]) 03/08/2022  . Hyperlipidemia associated with type 2 diabetes mellitus ([place]) 01/21/2022  .  Absence of bladder continence 04/07/2021  . Benign essential HTN 01/08/2021  . Chronic constipation 10/08/2020  . Skin mass 07/08/2020  . Mild atherosclerosis of carotid artery, right 05/07/2020  . Elevated BP without diagnosis of hypertension 04/03/2020  . Nonrheumatic mitral (valve) insufficiency 11/21/2019  . Cerebrovascular disease 11/21/2019  . Pure hypercholesterolemia 11/21/2019  . Erectile dysfunction 11/21/2019  . Coronary arteriosclerosis 11/21/2019  . COPD, mild ([place]) 09/27/2019  . Type 2 diabetes mellitus without complication, without long-term current use of insulin ([place]) 09/27/2019  . History of prostate cancer 08/03/2019       Latest Ref Rng & Units 02/05/2022   11:07 AM 04/03/2020    3:09 PM 10/05/2019   12:00 AM  CBC  WBC 4.0 - 10.5 K/uL 6.9  7.7  5.6      Hemoglobin 13.0 - 17.0 g/dL 13.6  14.1  11.3      Hematocrit 39.0 - 52.0 % 42.7  42.6  36      Platelets 150 - 400 K/uL 280  269.0  213         This result is from an external source.      Latest Ref Rng & Units 07/23/2022    9:29 AM 01/21/2022    8:16 AM 04/07/2021   10:19 AM  BMP  Glucose 70 - 99 mg/dL 144  134  153   BUN 6 - 23 mg/dL '21  24  25   '$ Creatinine 0.40 - 1.50 mg/dL 0.98  0.93  1.01   Sodium 135 - 145 mEq/L 139  140  140   Potassium 3.5 - 5.1 mEq/L 4.8  4.9  5.1   Chloride 96 - 112 mEq/L 102  102  102   CO2 19 - 32 mEq/L 31  35  32   Calcium 8.4 - 10.5 mg/dL 9.6  9.9  10.1     Risks and benefits of anesthesia discussed at length, patient or surrogate demonstrates understanding. Appropriately NPO. Plan to proceed with anesthesia.  Champ Mungo, MD 09/27/22   Anesthesia Physical  Anesthesia Plan  ASA: 2  Anesthesia Plan: General   Post-op Pain Management: Minimal or no pain anticipated   Induction: Intravenous  PONV Risk Score and Plan: Propofol infusion, TIVA and Treatment may vary due to age or medical condition  Airway Management Planned: LMA  Additional Equipment:  None  Intra-op Plan:   Post-operative Plan:   Informed Consent: I have reviewed the patients History and Physical, chart, labs and discussed the procedure including the risks, benefits and alternatives for the proposed anesthesia with the patient or authorized representative who has indicated his/her understanding and acceptance.     Dental advisory given  Plan Discussed with: CRNA and Surgeon  Anesthesia Plan Comments:        Anesthesia Quick Evaluation

## 2022-09-27 NOTE — Anesthesia Postprocedure Evaluation (Signed)
Anesthesia Post Note  Patient: Alexander Dixon  Procedure(s) Performed: COLONOSCOPY WITH PROPOFOL  Patient location during evaluation: Endoscopy Anesthesia Type: General Level of consciousness: awake and alert Pain management: pain level controlled Vital Signs Assessment: post-procedure vital signs reviewed and stable Respiratory status: spontaneous breathing, nonlabored ventilation and respiratory function stable Cardiovascular status: blood pressure returned to baseline and stable Postop Assessment: no apparent nausea or vomiting Anesthetic complications: no   No notable events documented.   Last Vitals:  Vitals:   09/27/22 0945 09/27/22 0955  BP: 127/76 (!) 140/71  Pulse: 68 69  Resp: 20 10  Temp:    SpO2: 98% 99%    Last Pain:  Vitals:   09/27/22 0955  TempSrc:   PainSc: 0-No pain                 Iran Ouch

## 2022-09-27 NOTE — Transfer of Care (Signed)
Immediate Anesthesia Transfer of Care Note  Patient: Alexander Dixon  Procedure(s) Performed: Procedure(s): COLONOSCOPY WITH PROPOFOL (N/A)  Patient Location: PACU and Endoscopy Unit  Anesthesia Type:General  Level of Consciousness: sedated  Airway & Oxygen Therapy: Patient Spontanous Breathing and Patient connected to nasal cannula oxygen  Post-op Assessment: Report given to RN and Post -op Vital signs reviewed and stable  Post vital signs: Reviewed and stable  Last Vitals:  Vitals:   09/27/22 0935 09/27/22 0936  BP: 110/75 110/75  Pulse: 66 65  Resp: 10 (!) 9  Temp: (!) 36.4 C   SpO2: 25% 85%    Complications: No apparent anesthesia complications

## 2022-09-27 NOTE — H&P (Signed)
Jonathon Bellows, MD [address] [place], [place], [place], [address] [address], [place], [place], [place], [address] Phone: [phone]  Fax: [phone]  Primary Care Physician:  Venia Carbon, MD   Pre-Procedure History & Physical: HPI:  Alexander Dixon is a 66 y.o. male is here for an colonoscopy.   Past Medical History:  Diagnosis Date   Arthritis    right knee   COPD, mild ([place]) 09/27/2019   FVC 71% and FEV1 73% untreated   Diabetes mellitus without complication ([place])    History of chicken pox    Prostate cancer ([place]) 2020   Pure hypercholesterolemia 11/21/2019    Past Surgical History:  Procedure Laterality Date   COLONOSCOPY WITH PROPOFOL N/A 04/21/2020   Procedure: COLONOSCOPY WITH PROPOFOL;  Surgeon: Jonathon Bellows, MD;  Location: [facility] ENDOSCOPY;  Service: Gastroenterology;  Laterality: N/A;  POSITIVE 3/23   CONTINUOUS NERVE MONITORING N/A 01/07/2021   Procedure: FACIAL NERVE MONITORING;  Surgeon: Carloyn Manner, MD;  Location: [place];  Service: ENT;  Laterality: N/A;   EAR CYST EXCISION Right 01/07/2021   Procedure: EXCISION EPIDERMAL INCLUSION CYST;  Surgeon: Carloyn Manner, MD;  Location: [place];  Service: ENT;  Laterality: Right;   HERNIA REPAIR Left    inguinal   HYDROCELE EXCISION Right 02/09/2022   Procedure: HYDROCELECTOMY ADULT;  Surgeon: Abbie Sons, MD;  Location: [facility] ORS;  Service: Urology;  Laterality: Right;   KNEE ARTHROSCOPY Right 1984   PROSTATE SURGERY     due to prostate cancer    Prior to Admission medications   Medication Sig Start Date End Date Taking? Authorizing Provider  Cholecalciferol (VITAMIN D3) 50 MCG (2000 UT) TABS Take 2,000 Units by mouth daily.    Yes [provider]  Ibuprofen-diphenhydrAMINE HCl (ADVIL PM) 200-25 MG CAPS Take 1 capsule by mouth at bedtime as needed (sleep).   Yes [provider]  metFORMIN (GLUCOPHAGE) 1000 MG tablet Take 1 tablet (1,000  mg total) by mouth 2 (two) times daily. 01/21/22  Yes Waunita Schooner, MD  Multiple Vitamins-Minerals (HAIR/SKIN/NAILS) CAPS Take 1 capsule by mouth daily.   Yes [provider]  Aromatic Inhalants (VICKS VAPOINHALER) INHA Inhale 1 puff into the lungs daily as needed (congestion).    [provider]  benzonatate (TESSALON PERLES) 100 MG capsule Take 1 capsule (100 mg total) by mouth 3 (three) times daily as needed. Patient not taking: Reported on 09/27/2022 08/25/22   Waunita Schooner, MD  Blood Glucose Monitoring Suppl [facility] BLOOD GLUCOSE METER) w/Device KIT Meter choice per insurance. 10/08/20   Waunita Schooner, MD  glucose blood (ONETOUCH ULTRA) test strip USE TO CHECK BLOOD SUGAR  TWICE DAILY 09/14/22   Dutch Quint B, FNP  Lancets (ONETOUCH DELICA PLUS [id number]) [place] USE TO CHECK BLOOD SUGAR TWICE  DAILY 11/23/21   Waunita Schooner, MD  latanoprost (XALATAN) 0.005 % ophthalmic solution Place 1 drop into both eyes at bedtime.  03/24/20   [provider]  Minoxidil (ROGAINE MENS EXTRA STRENGTH) 5 % FOAM Apply 1 application. topically 2 (two) times daily.    [provider]  rosuvastatin (CRESTOR) 5 MG tablet Take 1 tablet (5 mg total) by mouth 3 (three) times a week. 07/26/22 10/24/22  Waunita Schooner, MD  sildenafil (VIAGRA) 100 MG tablet Take 1 tablet (100 mg total) by mouth daily as needed for erectile dysfunction. 12/09/21   Abbie Sons, MD    Allergies as of 08/12/2022   (No  Known Allergies)    Family History  Problem Relation Age of Onset   Leukemia Mother    Arthritis Mother    Diabetes Mother    Alcohol abuse Father    COPD Father    Arthritis Maternal Grandmother    Diabetes Maternal Grandmother    Hearing loss Maternal Grandmother    Arthritis Maternal Grandfather    Heart attack Maternal Grandfather 52   Diabetes Maternal Grandfather    Heart disease Maternal Grandfather    Hyperlipidemia Maternal Grandfather    Hypertension Maternal  Grandfather    Prostate cancer Maternal Grandfather    Prostate cancer Maternal Uncle    Diabetes Maternal Uncle    Prostate cancer Cousin    Diabetes Maternal Uncle     Social History   Socioeconomic History   Marital status: Married    Spouse name: [name]   Number of children: 2   Years of education: associates degree   Highest education level: Not on file  Occupational History   Not on file  Tobacco Use   Smoking status: Former    Packs/day: 1.00    Years: 30.00    Total pack years: 30.00    Types: Cigarettes    Quit date: 12/2000    Years since quitting: 21.7   Smokeless tobacco: Never  Vaping Use   Vaping Use: Never used  Substance and Sexual Activity   Alcohol use: Not Currently    Comment: once in a blue moon   Drug use: Never   Sexual activity: Yes    Birth control/protection: Post-menopausal, Surgical  Other Topics Concern   Not on file  Social History Narrative   11/05/19   From: yes, born in [place]   Living: with [name]   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      Family: 2 daughters - Programmer, applications and [name] - grandchildren - 14 (most are adults)      Enjoys: gym, working around [employer], stay active - shopping      Exercise: 5 days a week   Diet: better than it used to be, avoids meat - lots of veggies and avoids sweets, mostly water      Safety   Seat belts: Yes    Guns: Yes  and secure   Safe in relationships: Yes    Social Determinants of Health   Financial Resource Strain: Low Risk  (08/03/2022)   Overall Financial Resource Strain (CARDIA)    Difficulty of Paying Living Expenses: Not hard at all  Food Insecurity: No Food Insecurity (08/03/2022)   Hunger Vital Sign    Worried About Running Out of Food in the Last Year: Never true    Ran Out of Food in the Last Year: Never true  Transportation Needs: No Transportation Needs (08/03/2022)   PRAPARE - Hydrologist (Medical): No    Lack of Transportation (Non-Medical): No   Physical Activity: Sufficiently Active (08/03/2022)   Exercise Vital Sign    Days of Exercise per Week: 5 days    Minutes of Exercise per Session: 60 min  Stress: No Stress Concern Present (08/03/2022)   [place]    Feeling of Stress : Not at all  Social Connections: Moderately Isolated (08/03/2022)   Social Connection and Isolation Panel [NHANES]    Frequency of Communication with Friends and Family: More than three times a week    Frequency of Social Gatherings with Friends  and Family: More than three times a week    Attends Religious Services: Never    Active Member of Clubs or Organizations: No    Attends Archivist Meetings: Never    Marital Status: Married  Human resources officer Violence: Not At Risk (08/03/2022)   Humiliation, Afraid, Rape, and Kick questionnaire    Fear of Current or Ex-Partner: No    Emotionally Abused: No    Physically Abused: No    Sexually Abused: No    Review of Systems: See HPI, otherwise negative ROS  Physical Exam: BP (!) 140/79   Pulse 90   Temp 97.7 F (36.5 C) (Temporal)   Resp 18   Ht 6' (1.829 m)   Wt 81.2 kg   SpO2 99%   BMI 24.28 kg/m  General:   Alert,  pleasant and cooperative in NAD Head:  Normocephalic and atraumatic. Neck:  Supple; no masses or thyromegaly. Lungs:  Clear throughout to auscultation, normal respiratory effort.    Heart:  +S1, +S2, Regular rate and rhythm, No edema. Abdomen:  Soft, nontender and nondistended. Normal bowel sounds, without guarding, and without rebound.   Neurologic:  Alert and  oriented x4;  grossly normal neurologically.  Impression/Plan: [name] is here for an colonoscopy to be performed for Screening colonoscopy average risk   Risks, benefits, limitations, and alternatives regarding  colonoscopy have been reviewed with the patient.  Questions have been answered.  All parties agreeable.   Jonathon Bellows, MD   09/27/2022, 9:06 AM

## 2022-09-27 NOTE — Anesthesia Procedure Notes (Signed)
Date/Time: 09/27/2022 9:16 AM  Performed by: Doreen Salvage, CRNAPre-anesthesia Checklist: Patient identified, Emergency Drugs available, Suction available and Patient being monitored Patient Re-evaluated:Patient Re-evaluated prior to induction Oxygen Delivery Method: Nasal cannula Induction Type: IV induction Dental Injury: Teeth and Oropharynx as per pre-operative assessment  Comments: Nasal cannula with etCO2 monitoring

## 2022-09-28 ENCOUNTER — Encounter: Payer: Self-pay | Admitting: Gastroenterology

## 2022-09-28 LAB — SURGICAL PATHOLOGY

## 2022-09-30 ENCOUNTER — Ambulatory Visit (INDEPENDENT_AMBULATORY_CARE_PROVIDER_SITE_OTHER): Payer: Medicare Other | Admitting: Internal Medicine

## 2022-09-30 ENCOUNTER — Encounter: Payer: Self-pay | Admitting: Internal Medicine

## 2022-09-30 VITALS — BP 138/74 | HR 61 | Temp 97.3°F | Ht 70.75 in | Wt 188.0 lb

## 2022-09-30 DIAGNOSIS — M79645 Pain in left finger(s): Secondary | ICD-10-CM | POA: Diagnosis not present

## 2022-09-30 MED ORDER — ONETOUCH VERIO VI STRP: ORAL_STRIP | 12 refills | Status: DC

## 2022-09-30 MED ORDER — ONETOUCH DELICA LANCING DEV MISC: 1.0000 | Freq: Every day | 3 refills | Status: DC

## 2022-09-30 MED ORDER — ONETOUCH VERIO W/DEVICE KIT: 1.0000 | PACK | Freq: Once | 0 refills | Status: AC

## 2022-09-30 NOTE — Patient Instructions (Signed)
Please try over the counter diclofenac gel for the thumb. If it continues, I can set you up for further evaluation.

## 2022-09-30 NOTE — Progress Notes (Signed)
Subjective:    Patient ID: Alexander Dixon, male    DOB: 09/18/1956, 66 y.o.   MRN: [id number]  HPI Here due to a problem with his left thumb Also concerned about his sugar readings  Some days are bad--not others Feels clicking and it seems like it is bone on bone---at DIP Goes back a few weeks No triggering or trouble extending  Checks sugars--running high at times Lots of variability in strips Generally has been 110 fasting (146 here/157 on his)  Current Outpatient Medications on File Prior to Visit  Medication Sig Dispense Refill   Aromatic Inhalants (VICKS VAPOINHALER) INHA Inhale 1 puff into the lungs daily as needed (congestion).     Cholecalciferol (VITAMIN D3) 50 MCG (2000 UT) TABS Take 2,000 Units by mouth daily.      Ibuprofen-diphenhydrAMINE HCl (ADVIL PM) 200-25 MG CAPS Take 1 capsule by mouth at bedtime as needed (sleep).     Lancets (ONETOUCH DELICA PLUS [id number]) [place]ISC USE TO CHECK BLOOD SUGAR TWICE  DAILY 200 each 3   latanoprost (XALATAN) 0.005 % ophthalmic solution Place 1 drop into both eyes at bedtime.      metFORMIN (GLUCOPHAGE) 1000 MG tablet Take 1 tablet (1,000 mg total) by mouth 2 (two) times daily. 180 tablet 3   Minoxidil (ROGAINE MENS EXTRA STRENGTH) 5 % FOAM Apply 1 application. topically 2 (two) times daily.     rosuvastatin (CRESTOR) 5 MG tablet Take 1 tablet (5 mg total) by mouth 3 (three) times a week. 36 tablet 0   sildenafil (VIAGRA) 100 MG tablet Take 1 tablet (100 mg total) by mouth daily as needed for erectile dysfunction. 30 tablet 5   No current facility-administered medications on file prior to visit.    No Known Allergies  Past Medical History:  Diagnosis Date   Arthritis    right knee   COPD, mild ([place]) 09/27/2019   FVC 71% and FEV1 73% untreated   Diabetes mellitus without complication ([place])    History of chicken pox    Prostate cancer ([place]) 2020   Pure hypercholesterolemia 11/21/2019    Past Surgical History:   Procedure Laterality Date   COLONOSCOPY WITH PROPOFOL N/A 04/21/2020   Procedure: COLONOSCOPY WITH PROPOFOL;  Surgeon: Jonathon Bellows, MD;  Location: [facility] ENDOSCOPY;  Service: Gastroenterology;  Laterality: N/A;  POSITIVE 3/23   COLONOSCOPY WITH PROPOFOL N/A 09/27/2022   Procedure: COLONOSCOPY WITH PROPOFOL;  Surgeon: Jonathon Bellows, MD;  Location: [facility] ENDOSCOPY;  Service: Gastroenterology;  Laterality: N/A;   CONTINUOUS NERVE MONITORING N/A 01/07/2021   Procedure: FACIAL NERVE MONITORING;  Surgeon: Carloyn Manner, MD;  Location: [place];  Service: ENT;  Laterality: N/A;   EAR CYST EXCISION Right 01/07/2021   Procedure: EXCISION EPIDERMAL INCLUSION CYST;  Surgeon: Carloyn Manner, MD;  Location: [place];  Service: ENT;  Laterality: Right;   HERNIA REPAIR Left    inguinal   HYDROCELE EXCISION Right 02/09/2022   Procedure: HYDROCELECTOMY ADULT;  Surgeon: Abbie Sons, MD;  Location: [facility] ORS;  Service: Urology;  Laterality: Right;   KNEE ARTHROSCOPY Right 1984   PROSTATE SURGERY     due to prostate cancer    Family History  Problem Relation Age of Onset   Leukemia Mother    Arthritis Mother    Diabetes Mother    Alcohol abuse Father    COPD Father    Arthritis Maternal Grandmother    Diabetes Maternal Grandmother    Hearing loss Maternal Grandmother  Arthritis Maternal Grandfather    Heart attack Maternal Grandfather 52   Diabetes Maternal Grandfather    Heart disease Maternal Grandfather    Hyperlipidemia Maternal Grandfather    Hypertension Maternal Grandfather    Prostate cancer Maternal Grandfather    Prostate cancer Maternal Uncle    Diabetes Maternal Uncle    Prostate cancer Cousin    Diabetes Maternal Uncle     Social History   Socioeconomic History   Marital status: Married    Spouse name: [name]   Number of children: 2   Years of education: associates degree   Highest education level: Not on file  Occupational History   Not  on file  Tobacco Use   Smoking status: Former    Packs/day: 1.00    Years: 30.00    Total pack years: 30.00    Types: Cigarettes    Quit date: 12/2000    Years since quitting: 21.7   Smokeless tobacco: Never  Vaping Use   Vaping Use: Never used  Substance and Sexual Activity   Alcohol use: Not Currently    Comment: once in a blue moon   Drug use: Never   Sexual activity: Yes    Birth control/protection: Post-menopausal, Surgical  Other Topics Concern   Not on file  Social History Narrative   11/05/19   From: yes, born in [place]   Living: with [name]   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      Family: 2 daughters - Programmer, applications and [name] - grandchildren - 27 (most are adults)      Enjoys: gym, working around [employer], stay active - shopping      Exercise: 5 days a week   Diet: better than it used to be, avoids meat - lots of veggies and avoids sweets, mostly water      Safety   Seat belts: Yes    Guns: Yes  and secure   Safe in relationships: Yes    Social Determinants of Health   Financial Resource Strain: Low Risk  (08/03/2022)   Overall Financial Resource Strain (CARDIA)    Difficulty of Paying Living Expenses: Not hard at all  Food Insecurity: No Food Insecurity (08/03/2022)   Hunger Vital Sign    Worried About Running Out of Food in the Last Year: Never true    [place] in the Last Year: Never true  Transportation Needs: No Transportation Needs (08/03/2022)   PRAPARE - Hydrologist (Medical): No    Lack of Transportation (Non-Medical): No  Physical Activity: Sufficiently Active (08/03/2022)   Exercise Vital Sign    Days of Exercise per Week: 5 days    Minutes of Exercise per Session: 60 min  Stress: No Stress Concern Present (08/03/2022)   [place]    Feeling of Stress : Not at all  Social Connections: Moderately Isolated (08/03/2022)   Social Connection and Isolation Panel  [NHANES]    Frequency of Communication with Friends and Family: More than three times a week    Frequency of Social Gatherings with Friends and Family: More than three times a week    Attends Religious Services: Never    Marine scientist or Organizations: No    Attends Archivist Meetings: Never    Marital Status: Married  Human resources officer Violence: Not At Risk (08/03/2022)   Humiliation, Afraid, Rape, and Kick questionnaire    Fear  of Current or Ex-Partner: No    Emotionally Abused: No    Physically Abused: No    Sexually Abused: No   Review of Systems Some right knee issues---did have past injury No illness     Objective:   Physical Exam Constitutional:      Appearance: Normal appearance.  Musculoskeletal:     Comments: Left thumb DIP has some tendon clicking--also into middle phalanx The joint itself is not swollen or inflamed or tender  Neurological:     Mental Status: He is alert.            Assessment & Plan:

## 2022-09-30 NOTE — Assessment & Plan Note (Signed)
Seems to be tendon issue--with some clicking Doesn't seem to be a joint issue Discussed that this could be self limited Will try OTC diclofenac gel To Dr Lorelei Pont if ongoing problems (or Copy)

## 2022-10-01 ENCOUNTER — Other Ambulatory Visit: Payer: Self-pay

## 2022-10-01 MED ORDER — ONETOUCH VERIO VI STRP: ORAL_STRIP | 12 refills | Status: DC

## 2022-10-07 IMAGING — US US SCROTUM W/ DOPPLER COMPLETE
1 series · 15 of 25 positions shown · non-contrast
Comparison: None.

CLINICAL DATA: Large right hydrocele. Assess for evidence of hernia

EXAM:
SCROTAL ULTRASOUND
DOPPLER ULTRASOUND OF THE TESTICLES
TECHNIQUE: Complete ultrasound examination of the testicles, epididymis, and
other scrotal structures was performed. Color and spectral Doppler
ultrasound were also utilized to evaluate blood flow to the
testicles.

[Series 1: us art/ven flow abd pelv doppl · 15 of 51 slices shown]
[im 1/51]
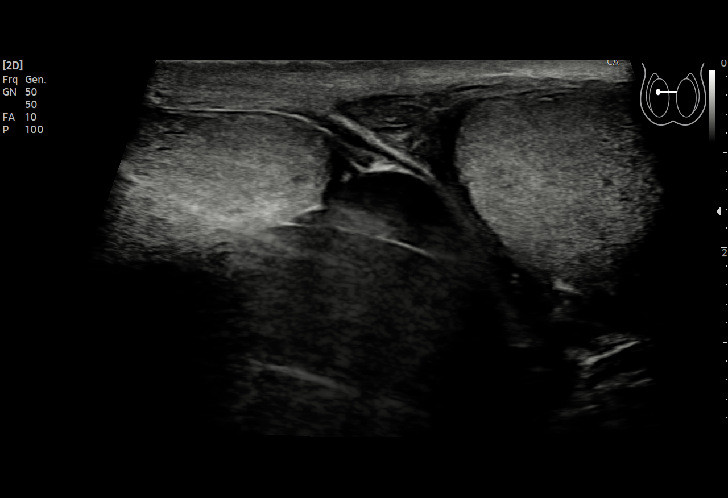
[im 5/51]
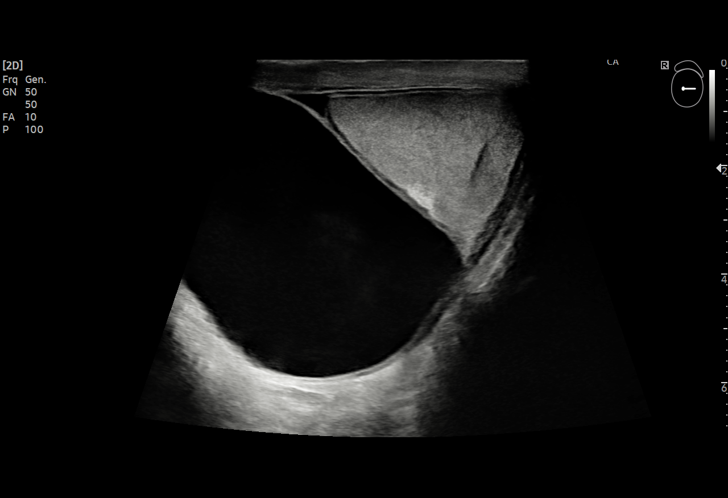
[im 9/51]
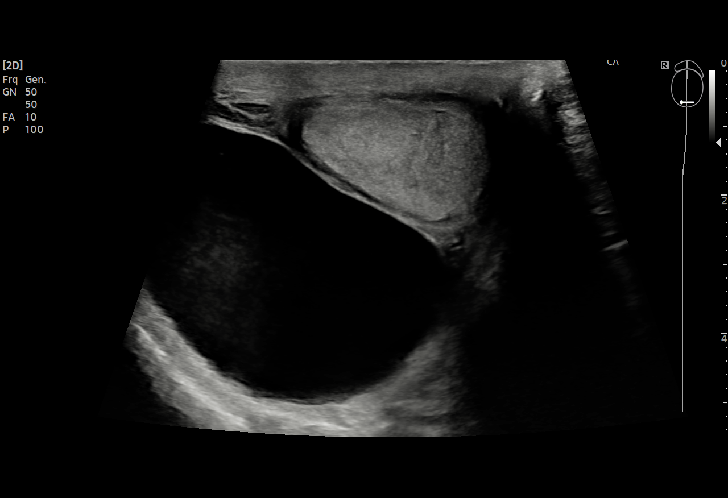
[im 11/51]
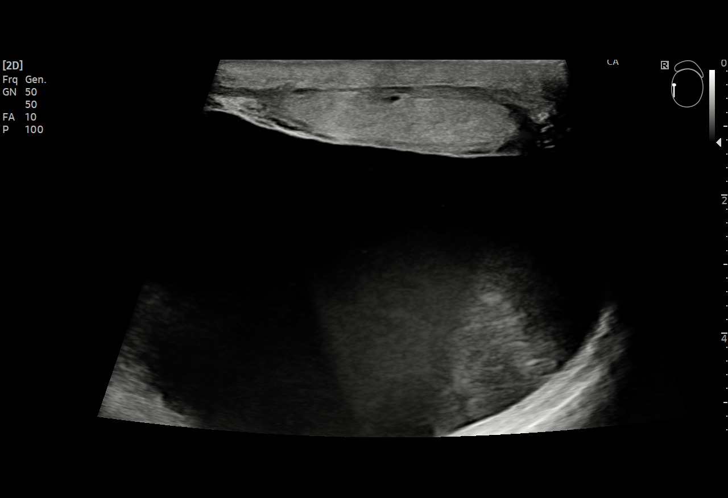
[im 15/51]
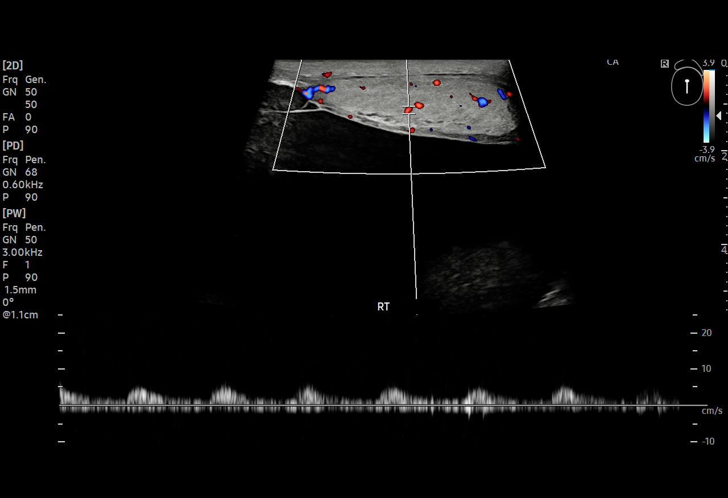
[im 19/51]
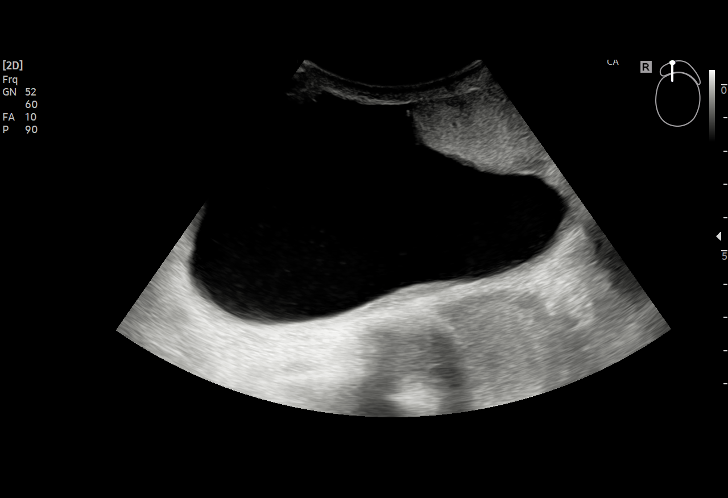
[im 21/51]
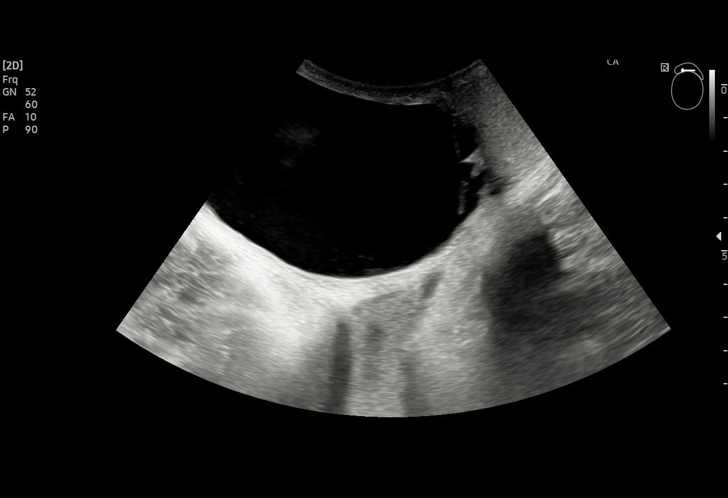
[im 26/51]
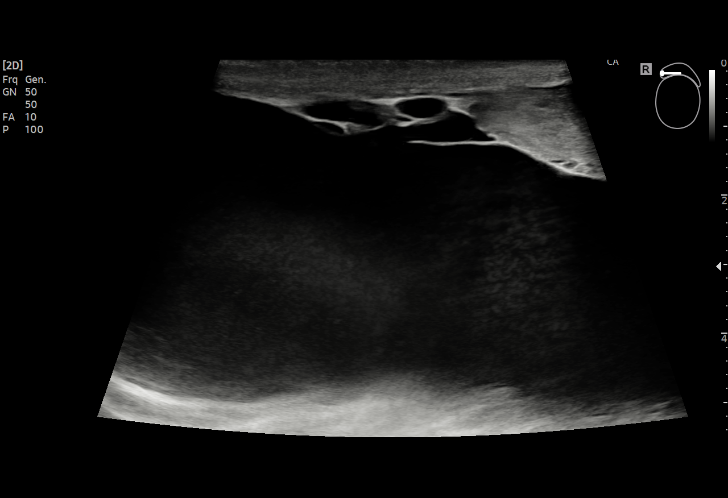
[im 30/51]
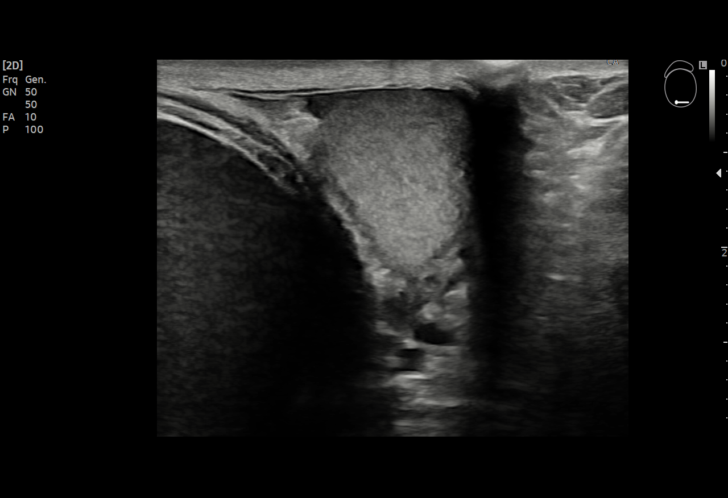
[im 32/51]
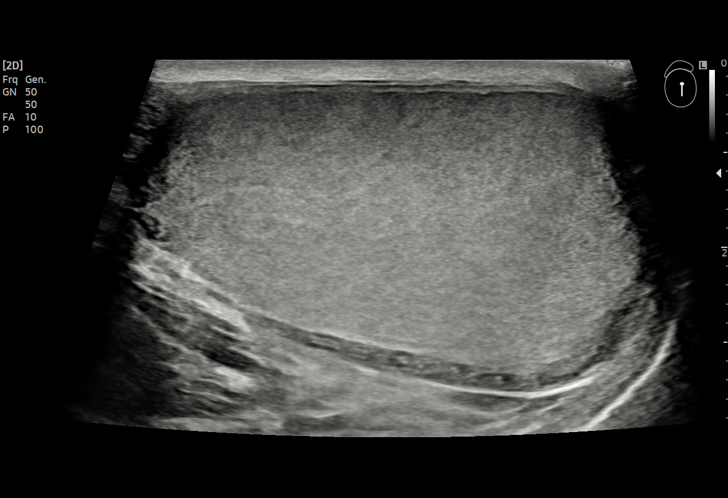
[im 36/51]
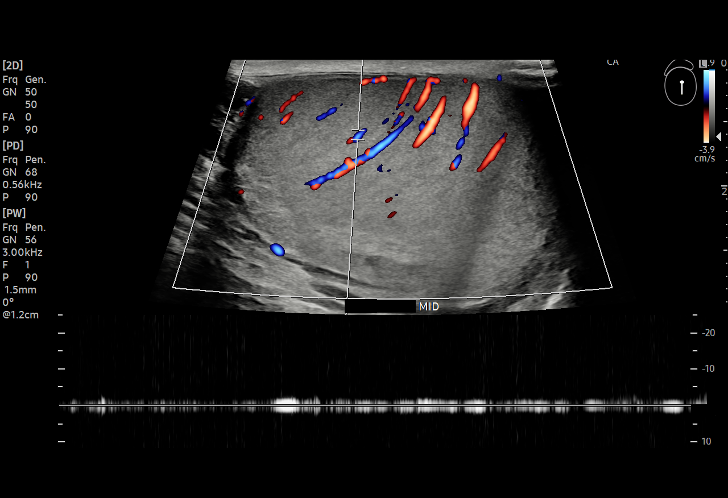
[im 40/51]
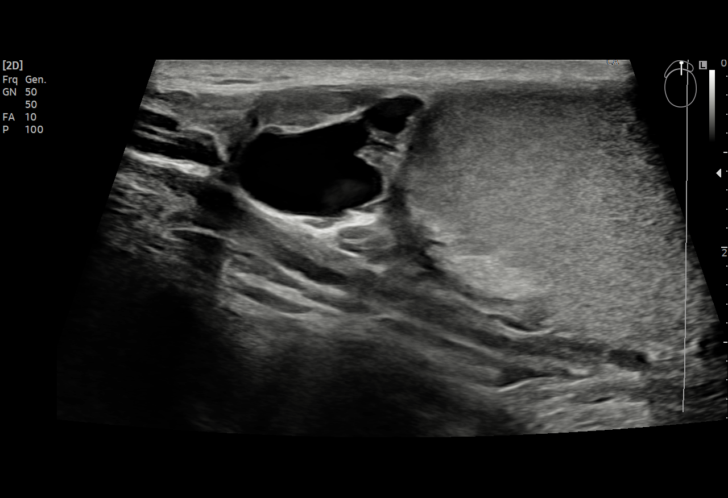
[im 42/51]
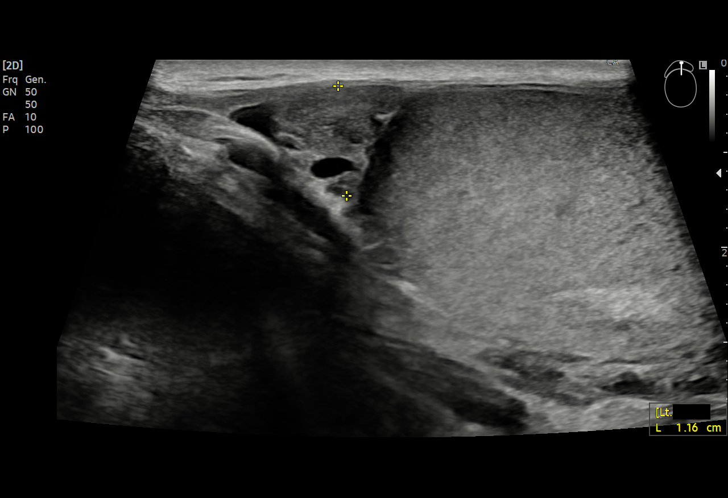
[im 46/51]
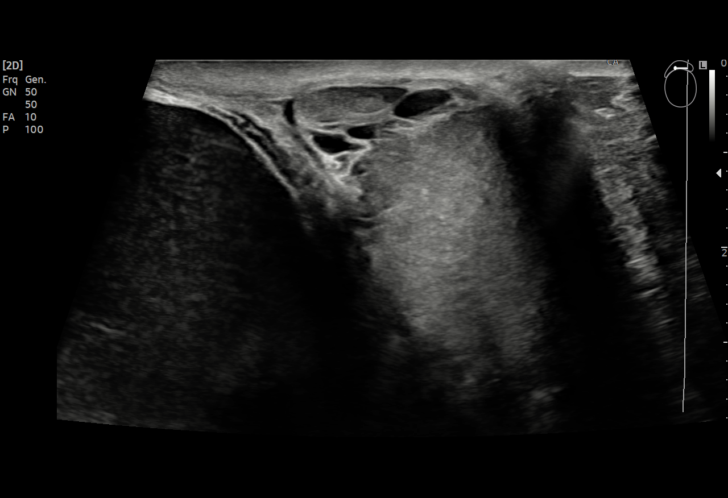
[im 51/51]
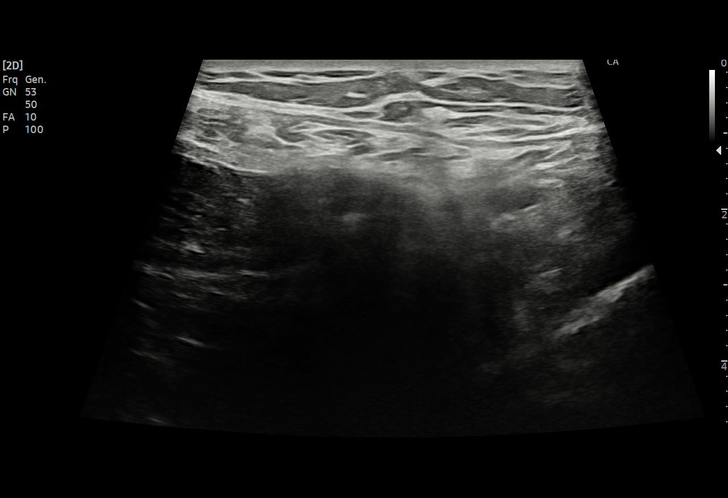

[15 of 25 positions shown; findings below may reference images not displayed]

FINDINGS: Right testicle

Measurements: 5.7 x 1.7 x 2.3 cm. No mass or microlithiasis
visualized.

Left testicle

Measurements: 5.1 x 2.7 x 2.2 cm. No mass or microlithiasis
visualized.

Right epididymis: The right epididymis is not visualized due to
large right-sided scrotal fluid.

Left epididymis: There are multiple left epididymal cysts, largest
measuring 1.5 x 0.8 x 1.2 cm.

Hydrocele:  Large volume right scrotal fluid.  Small left hydrocele.

Varicocele:  None visualized.

Pulsed Doppler interrogation of both testes demonstrates normal low
resistance arterial and venous waveforms bilaterally.

Additional images of the bilateral inguinal regions demonstrate no
evidence of hernia.
IMPRESSION: Large right-sided hydrocele/spermatocele.

Multiple left-sided epididymal cysts, largest measuring 1.5 x 0.8 x
1.2 cm.

No evidence of hernia.

## 2022-10-11 ENCOUNTER — Other Ambulatory Visit: Payer: Self-pay

## 2022-10-11 DIAGNOSIS — E1169 Type 2 diabetes mellitus with other specified complication: Secondary | ICD-10-CM

## 2022-10-11 MED ORDER — ROSUVASTATIN CALCIUM 5 MG PO TABS: 5.0000 mg | ORAL_TABLET | ORAL | 0 refills | Status: DC

## 2022-10-29 ENCOUNTER — Encounter: Payer: Medicare Other | Admitting: Internal Medicine

## 2022-11-30 ENCOUNTER — Other Ambulatory Visit: Payer: Self-pay | Admitting: *Deleted

## 2022-11-30 DIAGNOSIS — C61 Malignant neoplasm of prostate: Secondary | ICD-10-CM

## 2022-12-07 ENCOUNTER — Telehealth: Payer: Self-pay | Admitting: Internal Medicine

## 2022-12-07 NOTE — Telephone Encounter (Signed)
I will have to ask Dr Silvio Pate. We have only seen him once. He was a previous [place] pt.

## 2022-12-07 NOTE — Telephone Encounter (Signed)
Pt called stating he received a meter & needed help setting it up? Call back # [phone]

## 2022-12-07 NOTE — Telephone Encounter (Signed)
I haven't worked with this patient, I think he just received a regular glucometer and needs a nurse visit to set it up. If that is not the case, let me know.

## 2022-12-07 NOTE — Telephone Encounter (Signed)
Spoke to pt. Made him a nurse visit appt 12-14-22 at 11 am.

## 2022-12-14 ENCOUNTER — Ambulatory Visit: Payer: Medicare Other

## 2022-12-16 ENCOUNTER — Other Ambulatory Visit: Payer: Medicare Other

## 2022-12-16 DIAGNOSIS — C61 Malignant neoplasm of prostate: Secondary | ICD-10-CM

## 2022-12-17 LAB — PSA: Prostate Specific Ag, Serum: <0.1 ng/mL (ref 0.0–4.0)

## 2022-12-19 ENCOUNTER — Other Ambulatory Visit: Payer: Self-pay | Admitting: Urology

## 2022-12-19 ENCOUNTER — Other Ambulatory Visit: Payer: Self-pay | Admitting: Internal Medicine

## 2022-12-19 DIAGNOSIS — E1169 Type 2 diabetes mellitus with other specified complication: Secondary | ICD-10-CM

## 2022-12-20 ENCOUNTER — Ambulatory Visit: Payer: Medicare Other | Admitting: Urology

## 2022-12-20 ENCOUNTER — Encounter: Payer: Self-pay | Admitting: Urology

## 2022-12-20 VITALS — BP 128/71 | HR 66 | Ht 70.75 in | Wt 188.0 lb

## 2022-12-20 DIAGNOSIS — N5239 Other post-surgical erectile dysfunction: Secondary | ICD-10-CM | POA: Diagnosis not present

## 2022-12-20 DIAGNOSIS — N9989 Other postprocedural complications and disorders of genitourinary system: Secondary | ICD-10-CM | POA: Diagnosis not present

## 2022-12-20 DIAGNOSIS — Z8546 Personal history of malignant neoplasm of prostate: Secondary | ICD-10-CM | POA: Diagnosis not present

## 2022-12-20 DIAGNOSIS — N393 Stress incontinence (female) (male): Secondary | ICD-10-CM

## 2022-12-20 MED ORDER — SILDENAFIL CITRATE 100 MG PO TABS: 100.0000 mg | ORAL_TABLET | Freq: Every day | ORAL | 5 refills | Status: DC | PRN

## 2022-12-20 NOTE — Addendum Note (Signed)
Addended by: Chrystie Nose on: 12/20/2022 12:26 PM   Modules accepted: Orders

## 2022-12-20 NOTE — Progress Notes (Signed)
12/20/2022 9:39 AM   Alexander Dixon Jun 07, 1956 [id number]  Referring provider: Waunita Schooner, MD [address] [place],  [place] [address]  Chief Complaint  Patient presents with   Follow-up   Urologic history: 1. Prostate cancer TRUS/biopsy September 2010 [facility]; PSA 7.39; benign pathology Rebiopsy 05/2015; PSA 9.37; volume 118 cc; Path focal Gleason 3+3; elected active surveillance MRI 11/2015 no evidence of extracapsular disease; confirmatory biopsy 11/2015 1 core Gleason 3+3 adenocarcinoma; volume 126 cc Rebiopsy 06/2019; 2 cores Gleason 3+4 adenocarcinoma Elected RARP by Dr. Thomos Lemons at [facility]; [place] prostatectomy urinary incontinence  2.  Erectile dysfunction Sildenafil 100 mg  3.  Status post left hydrocelectomy Large left hydrocele Hydrocelectomy 02/09/2022  HPI: 67 y.o. male presents for postop follow-up.  Right hydrocelectomy 02/09/2022 This was initially done in anticipation of placement of an artificial urinary sphincter He elected to retry pelvic floor PT prior to proceeding with sphincter implantation which he is currently undergoing and states he is already seeing improvement. 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   FVC 71% and FEV1 73% untreated   Diabetes mellitus without complication ([place])    History of chicken pox    Prostate cancer ([place]) 2020   Pure hypercholesterolemia 11/21/2019    Surgical History: Past Surgical History:  Procedure Laterality Date   COLONOSCOPY WITH PROPOFOL N/A 04/21/2020   Procedure: COLONOSCOPY WITH PROPOFOL;  Surgeon: Jonathon Bellows, MD;  Location: [facility] ENDOSCOPY;  Service: Gastroenterology;  Laterality: N/A;  POSITIVE 3/23   COLONOSCOPY WITH PROPOFOL N/A  09/27/2022   Procedure: COLONOSCOPY WITH PROPOFOL;  Surgeon: Jonathon Bellows, MD;  Location: [facility] ENDOSCOPY;  Service: Gastroenterology;  Laterality: N/A;   CONTINUOUS NERVE MONITORING N/A 01/07/2021   Procedure: FACIAL NERVE MONITORING;  Surgeon: Carloyn Manner, MD;  Location: [place];  Service: ENT;  Laterality: N/A;   EAR CYST EXCISION Right 01/07/2021   Procedure: EXCISION EPIDERMAL INCLUSION CYST;  Surgeon: Carloyn Manner, MD;  Location: [place];  Service: ENT;  Laterality: Right;   HERNIA REPAIR Left    inguinal   HYDROCELE EXCISION Right 02/09/2022   Procedure: HYDROCELECTOMY ADULT;  Surgeon: Abbie Sons, MD;  Location: [facility] ORS;  Service: Urology;  Laterality: Right;   KNEE ARTHROSCOPY Right 1984   PROSTATE SURGERY     due to prostate cancer    Home Medications:  Allergies as of 12/20/2022   No Known Allergies      Medication List        Accurate as of December 20, 2022  9:39 AM. If you have any questions, ask your nurse or doctor.          Advil PM 200-25 MG Caps Generic drug: Ibuprofen-diphenhydrAMINE HCl Take 1 capsule by mouth at bedtime as needed (sleep).   latanoprost 0.005 % ophthalmic solution Commonly known as: XALATAN Place 1 drop into both eyes at bedtime.   metFORMIN 1000 MG tablet Commonly known as: GLUCOPHAGE Take 1 tablet (1,000 mg total) by mouth 2 (two) times daily.   ONE TOUCH DELICA LANCING DEV Misc 1 each by Does not apply route daily at 2 PM.   OneTouch Delica Plus [id number] Misc USE TO CHECK BLOOD SUGAR TWICE  DAILY   OneTouch Verio Flex System w/Device  Kit See admin instructions.   OneTouch Verio test strip Generic drug: glucose blood Use one strip daily to check blood sugar Dx Code E11.9   Rogaine Mens Extra Strength 5 % Foam Generic drug: Minoxidil Apply 1 application. topically 2 (two) times daily.   rosuvastatin 5 MG tablet Commonly known as: Crestor Take 1 tablet (5 mg total) by mouth 3  (three) times a week.   sildenafil 100 MG tablet Commonly known as: VIAGRA Take 1 tablet (100 mg total) by mouth daily as needed for erectile dysfunction.   Vicks VapoInhaler Inha Inhale 1 puff into the lungs daily as needed (congestion).   Vitamin D3 50 MCG (2000 UT) Tabs Take 2,000 Units by mouth daily.        Allergies: No Known Allergies  Family History: Family History  Problem Relation Age of Onset   Leukemia Mother    Arthritis Mother    Diabetes Mother    Alcohol abuse Father    COPD Father    Arthritis Maternal Grandmother    Diabetes Maternal Grandmother    Hearing loss Maternal Grandmother    Arthritis Maternal Grandfather    Heart attack Maternal Grandfather 52   Diabetes Maternal Grandfather    Heart disease Maternal Grandfather    Hyperlipidemia Maternal Grandfather    Hypertension Maternal Grandfather    Prostate cancer Maternal Grandfather    Prostate cancer Maternal Uncle    Diabetes Maternal Uncle    Prostate cancer Cousin    Diabetes Maternal Uncle     Social History:  reports that he quit smoking about 21 years ago. His smoking use included cigarettes. He has a 30.00 pack-year smoking history. He has never used smokeless tobacco. He reports that he does not currently use alcohol. He reports that he does not use drugs.   Physical Exam: BP 128/71   Pulse 66   Ht 5' 10.75" (1.797 m)   Wt 188 lb (85.3 kg)   BMI 26.41 kg/m   Constitutional:  Alert and oriented, No acute distress. HEENT: [place] AT Respiratory: Normal respiratory effort, no increased work of breathing. Psychiatric: Normal mood and affect.   Assessment & Plan:    1.  History of prostate cancer PSA remains undetectable Lab visit PSA 6 months 1 year follow-up with PSA  2.  Erectile dysfunction Stable Sildenafil refilled   Abbie Sons, MD  [facility] [address], [place], [place] [address] [phone]

## 2022-12-31 ENCOUNTER — Encounter: Payer: Self-pay | Admitting: Internal Medicine

## 2022-12-31 ENCOUNTER — Ambulatory Visit (INDEPENDENT_AMBULATORY_CARE_PROVIDER_SITE_OTHER): Payer: Medicare Other | Admitting: Internal Medicine

## 2022-12-31 VITALS — BP 108/62 | HR 60 | Temp 97.3°F | Ht 67.5 in | Wt 197.0 lb

## 2022-12-31 DIAGNOSIS — I10 Essential (primary) hypertension: Secondary | ICD-10-CM

## 2022-12-31 DIAGNOSIS — E119 Type 2 diabetes mellitus without complications: Secondary | ICD-10-CM | POA: Diagnosis not present

## 2022-12-31 DIAGNOSIS — E1159 Type 2 diabetes mellitus with other circulatory complications: Secondary | ICD-10-CM

## 2022-12-31 LAB — POCT GLYCOSYLATED HEMOGLOBIN (HGB A1C): Hemoglobin A1C: 7.4 % — AB (ref 4.0–5.6)

## 2022-12-31 NOTE — Progress Notes (Signed)
Subjective:    Patient ID: Alexander Dixon, male    DOB: September 03, 1956, 67 y.o.   MRN: [id number]  HPI Here for follow up of diabetes  Now checking his sugars--new machine 113 this morning Home health nurse got 7.6% when she was there Tries to be careful with eating  Recent urology visit PSA still undetectable  Has spot on right cheek Removed surgically but now has recurred  Current Outpatient Medications on File Prior to Visit  Medication Sig Dispense Refill   Aromatic Inhalants (VICKS VAPOINHALER) INHA Inhale 1 puff into the lungs daily as needed (congestion).     Blood Glucose Monitoring Suppl ([place]) w/Device KIT See admin instructions.     Cholecalciferol (VITAMIN D3) 50 MCG (2000 UT) TABS Take 2,000 Units by mouth daily.      glucose blood (ONETOUCH VERIO) test strip Use one strip daily to check blood sugar Dx Code E11.9 100 each 12   Ibuprofen-diphenhydrAMINE HCl (ADVIL PM) 200-25 MG CAPS Take 1 capsule by mouth at bedtime as needed (sleep).     Lancet Devices (ONE TOUCH DELICA LANCING DEV) MISC 1 each by Does not apply route daily at 2 PM. 100 each 3   Lancets (ONETOUCH DELICA PLUS [id number]) MISC USE TO CHECK BLOOD SUGAR TWICE  DAILY 200 each 3   latanoprost (XALATAN) 0.005 % ophthalmic solution Place 1 drop into both eyes at bedtime.      metFORMIN (GLUCOPHAGE) 1000 MG tablet Take 1 tablet (1,000 mg total) by mouth 2 (two) times daily. 180 tablet 3   Minoxidil (ROGAINE MENS EXTRA STRENGTH) 5 % FOAM Apply 1 application. topically 2 (two) times daily.     rosuvastatin (CRESTOR) 5 MG tablet TAKE 1 TABLET BY MOUTH 3 TIMES A WEEK. 36 tablet 0   sildenafil (VIAGRA) 100 MG tablet Take 1 tablet (100 mg total) by mouth daily as needed for erectile dysfunction. 30 tablet 5   No current facility-administered medications on file prior to visit.    No Known Allergies  Past Medical History:  Diagnosis Date   Arthritis    right knee   COPD, mild ([place])  09/27/2019   FVC 71% and FEV1 73% untreated   Diabetes mellitus without complication ([place])    History of chicken pox    Prostate cancer ([place]) 2020   Pure hypercholesterolemia 11/21/2019    Past Surgical History:  Procedure Laterality Date   COLONOSCOPY WITH PROPOFOL N/A 04/21/2020   Procedure: COLONOSCOPY WITH PROPOFOL;  Surgeon: Jonathon Bellows, MD;  Location: [facility] ENDOSCOPY;  Service: Gastroenterology;  Laterality: N/A;  POSITIVE 3/23   COLONOSCOPY WITH PROPOFOL N/A 09/27/2022   Procedure: COLONOSCOPY WITH PROPOFOL;  Surgeon: Jonathon Bellows, MD;  Location: [facility] ENDOSCOPY;  Service: Gastroenterology;  Laterality: N/A;   CONTINUOUS NERVE MONITORING N/A 01/07/2021   Procedure: FACIAL NERVE MONITORING;  Surgeon: Carloyn Manner, MD;  Location: [place];  Service: ENT;  Laterality: N/A;   EAR CYST EXCISION Right 01/07/2021   Procedure: EXCISION EPIDERMAL INCLUSION CYST;  Surgeon: Carloyn Manner, MD;  Location: [place];  Service: ENT;  Laterality: Right;   HERNIA REPAIR Left    inguinal   HYDROCELE EXCISION Right 02/09/2022   Procedure: HYDROCELECTOMY ADULT;  Surgeon: Abbie Sons, MD;  Location: [facility] ORS;  Service: Urology;  Laterality: Right;   KNEE ARTHROSCOPY Right 1984   PROSTATE SURGERY     due to prostate cancer    Family History  Problem Relation Age of Onset  Leukemia Mother    Arthritis Mother    Diabetes Mother    Alcohol abuse Father    COPD Father    Arthritis Maternal Grandmother    Diabetes Maternal Grandmother    Hearing loss Maternal Grandmother    Arthritis Maternal Grandfather    Heart attack Maternal Grandfather 52   Diabetes Maternal Grandfather    Heart disease Maternal Grandfather    Hyperlipidemia Maternal Grandfather    Hypertension Maternal Grandfather    Prostate cancer Maternal Grandfather    Prostate cancer Maternal Uncle    Diabetes Maternal Uncle    Prostate cancer Cousin    Diabetes Maternal Uncle     Social  History   Socioeconomic History   Marital status: Married    Spouse name: [name]   Number of children: 2   Years of education: associates degree   Highest education level: Not on file  Occupational History   Not on file  Tobacco Use   Smoking status: Former    Packs/day: 1.00    Years: 30.00    Total pack years: 30.00    Types: Cigarettes    Quit date: 12/2000    Years since quitting: 22.0    Passive exposure: Never   Smokeless tobacco: Never  Vaping Use   Vaping Use: Never used  Substance and Sexual Activity   Alcohol use: Not Currently    Comment: once in a blue moon   Drug use: Never   Sexual activity: Yes    Birth control/protection: Post-menopausal, Surgical  Other Topics Concern   Not on file  Social History Narrative   11/05/19   From: yes, born in [place]   Living: with [name]   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      Family: 2 daughters - Programmer, applications and [name] - grandchildren - 65 (most are adults)      Enjoys: gym, working around [employer], stay active - shopping      Exercise: 5 days a week   Diet: better than it used to be, avoids meat - lots of veggies and avoids sweets, mostly water      Safety   Seat belts: Yes    Guns: Yes  and secure   Safe in relationships: Yes    Social Determinants of Health   Financial Resource Strain: Low Risk  (08/03/2022)   Overall Financial Resource Strain (CARDIA)    Difficulty of Paying Living Expenses: Not hard at all  Food Insecurity: No Food Insecurity (08/03/2022)   Hunger Vital Sign    Worried About Running Out of Food in the Last Year: Never true    Ran Out of Food in the Last Year: Never true  Transportation Needs: No Transportation Needs (08/03/2022)   PRAPARE - Hydrologist (Medical): No    Lack of Transportation (Non-Medical): No  Physical Activity: Sufficiently Active (08/03/2022)   Exercise Vital Sign    Days of Exercise per Week: 5 days    Minutes of Exercise per Session: 60 min   Stress: No Stress Concern Present (08/03/2022)   [place]    Feeling of Stress : Not at all  Social Connections: Moderately Isolated (08/03/2022)   Social Connection and Isolation Panel [NHANES]    Frequency of Communication with Friends and Family: More than three times a week    Frequency of Social Gatherings with Friends and Family: More than three times a week  Attends Religious Services: Never    Active Member of Clubs or Organizations: No    Attends Archivist Meetings: Never    Marital Status: Married  Human resources officer Violence: Not At Risk (08/03/2022)   Humiliation, Afraid, Rape, and Kick questionnaire    Fear of Current or Ex-Partner: No    Emotionally Abused: No    Physically Abused: No    Sexually Abused: No   Review of Systems Weight is stable Does exercise--treadmill and weights/bicycle (Y)    Objective:   Physical Exam Constitutional:      Appearance: Normal appearance.  HENT:     Head:     Comments: Has apparent cyst mid right mandible area Cardiovascular:     Rate and Rhythm: Normal rate and regular rhythm.     Heart sounds: No murmur heard.    No gallop.     Comments: Faint pedal pulses Pulmonary:     Effort: Pulmonary effort is normal.     Breath sounds: Normal breath sounds. No wheezing or rales.  Musculoskeletal:     Cervical back: Neck supple.  Lymphadenopathy:     Cervical: No cervical adenopathy.  Skin:    Comments: No foot lesions  Neurological:     Mental Status: He is alert.  Psychiatric:        Mood and Affect: Mood normal.        Behavior: Behavior normal.            Assessment & Plan:

## 2022-12-31 NOTE — Assessment & Plan Note (Signed)
Lab Results  Component Value Date   HGBA1C 7.4 (A) 12/31/2022   Good control on metformin 1000 bid No changes

## 2022-12-31 NOTE — Assessment & Plan Note (Signed)
BP Readings from Last 3 Encounters:  12/31/22 108/62  12/20/22 128/71  09/30/22 138/74   Now controlled with just lifestyle

## 2023-01-13 ENCOUNTER — Telehealth: Payer: Self-pay

## 2023-01-13 DIAGNOSIS — E119 Type 2 diabetes mellitus without complications: Secondary | ICD-10-CM

## 2023-01-13 MED ORDER — METFORMIN HCL 1000 MG PO TABS: 1000.0000 mg | ORAL_TABLET | Freq: Two times a day (BID) | ORAL | 3 refills | Status: DC

## 2023-01-13 NOTE — Telephone Encounter (Signed)
Rx sent electronically.  

## 2023-01-25 ENCOUNTER — Encounter: Payer: Medicare Other | Admitting: Internal Medicine

## 2023-01-31 ENCOUNTER — Ambulatory Visit (INDEPENDENT_AMBULATORY_CARE_PROVIDER_SITE_OTHER): Payer: Medicare Other | Admitting: Family Medicine

## 2023-01-31 ENCOUNTER — Encounter: Payer: Self-pay | Admitting: Family Medicine

## 2023-01-31 VITALS — BP 160/80 | HR 83 | Temp 98.1°F | Ht 67.5 in | Wt 195.0 lb

## 2023-01-31 DIAGNOSIS — M79672 Pain in left foot: Secondary | ICD-10-CM

## 2023-01-31 MED ORDER — TRAMADOL HCL 50 MG PO TABS: 50.0000 mg | ORAL_TABLET | Freq: Three times a day (TID) | ORAL | 0 refills | Status: AC | PRN

## 2023-01-31 NOTE — Patient Instructions (Signed)
Use the boot when weight bearing.  Okay to remove when at rest.  Ice your foot for 5 minutes at a time several times a day.  If the pain isn't getting better over the next few days, then let us know.   Advil for pain, with food.  Take tramadol if needed.  Sedation caution.   If the pain is gradually getting better, then continue with the boot and icing until pain free.  Then gradually wear the boot less each day.   Take care.  Glad to see you.

## 2023-01-31 NOTE — Progress Notes (Unsigned)
Left ankle pain since last Wednesday. Patient states he was in class at the gym, doing calf raises. Foot was swollen. He has been taking advil for the pain.  No injury during the exercise.  He was able to do the exercise.  Then more pain after the fact.  Limped in today, using a cane.  Less pain with heel walking.  No plantar pain but diffusely ttp L dorsal foot.   L calf and achilles not ttp.  Medial and lat mal not ttp.   Normal DP pulse.   Dorsal foot ttp with resisted dorsiflexion.  No pain with resisted plantar flexion.    Improved weight bearing in short leg boot.

## 2023-02-02 DIAGNOSIS — M79673 Pain in unspecified foot: Secondary | ICD-10-CM | POA: Insufficient documentation

## 2023-02-02 NOTE — Assessment & Plan Note (Signed)
Okay to defer imaging at this point.  Looks like he has dorsal foot tendon strain/irritation and immobilization is likely the best option.  Reasonable to use a short leg Cam boot.  He was able to bear weight easier with less pain using that. Rationale for use discussed with patient.  Routine cautions given to patient regarding cam boot.  He does not use his left foot when driving.  He does not drive a car that has a manual clutch.  Use the boot when weight bearing.  Okay to remove when at rest.  Ice foot for 5 minutes at a time several times a day.  If the pain isn't getting better over the next few days, then let us know.   Advil for pain, with food.  Take tramadol if needed.  Sedation caution.   If the pain is gradually getting better, then continue with the boot and icing until pain free.  Then gradually wear the boot less each day.

## 2023-02-11 DIAGNOSIS — D487 Neoplasm of uncertain behavior of other specified sites: Secondary | ICD-10-CM | POA: Diagnosis not present

## 2023-02-21 ENCOUNTER — Ambulatory Visit (INDEPENDENT_AMBULATORY_CARE_PROVIDER_SITE_OTHER): Payer: Medicare Other | Admitting: Internal Medicine

## 2023-02-21 ENCOUNTER — Encounter: Payer: Self-pay | Admitting: Internal Medicine

## 2023-02-21 VITALS — BP 136/80 | HR 66 | Temp 97.2°F | Ht 67.5 in | Wt 188.0 lb

## 2023-02-21 DIAGNOSIS — M79672 Pain in left foot: Secondary | ICD-10-CM | POA: Diagnosis not present

## 2023-02-21 NOTE — Progress Notes (Signed)
Subjective:    Patient ID: Alexander Dixon, male    DOB: Sep 19, 1956, 67 y.o.   MRN: [id number]  HPI Here for follow up on left foot pain  Hurt left foot doing calf raises in the gym (seen 3 weeks ago) Pain and swelling in the top of his foot down to toes Seen here Used ice and the CAM boot---- wore this for 10 days  Still has pain around the ankle---over top from one malleolus to the other Is back in the gym--but being careful (treadmill mostly)  Uses tylenol --mostly at night Hearing pad also helps  Current Outpatient Medications on File Prior to Visit  Medication Sig Dispense Refill   Aromatic Inhalants (VICKS VAPOINHALER) INHA Inhale 1 puff into the lungs daily as needed (congestion).     Blood Glucose Monitoring Suppl ([place]) w/Device KIT See admin instructions.     Cholecalciferol (VITAMIN D3) 50 MCG (2000 UT) TABS Take 2,000 Units by mouth daily.      glucose blood (ONETOUCH VERIO) test strip Use one strip daily to check blood sugar Dx Code E11.9 100 each 12   Ibuprofen-diphenhydrAMINE HCl (ADVIL PM) 200-25 MG CAPS Take 1 capsule by mouth at bedtime as needed (sleep).     Lancet Devices (ONE TOUCH DELICA LANCING DEV) MISC 1 each by Does not apply route daily at 2 PM. 100 each 3   Lancets (ONETOUCH DELICA PLUS 123XX123) MISC USE TO CHECK BLOOD SUGAR TWICE  DAILY 200 each 3   latanoprost (XALATAN) 0.005 % ophthalmic solution Place 1 drop into both eyes at bedtime.      metFORMIN (GLUCOPHAGE) 1000 MG tablet Take 1 tablet (1,000 mg total) by mouth 2 (two) times daily. 180 tablet 3   Minoxidil (ROGAINE MENS EXTRA STRENGTH) 5 % FOAM Apply 1 application. topically 2 (two) times daily.     rosuvastatin (CRESTOR) 5 MG tablet TAKE 1 TABLET BY MOUTH 3 TIMES A WEEK. 36 tablet 0   sildenafil (VIAGRA) 100 MG tablet Take 1 tablet (100 mg total) by mouth daily as needed for erectile dysfunction. 30 tablet 5   No current facility-administered medications on file  prior to visit.    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   FVC 71% and FEV1 73% untreated   Diabetes mellitus without complication ([place])    History of chicken pox    Prostate cancer ([place]) 2020   Pure hypercholesterolemia 11/21/2019    Past Surgical History:  Procedure Laterality Date   COLONOSCOPY WITH PROPOFOL N/A 04/21/2020   Procedure: COLONOSCOPY WITH PROPOFOL;  Surgeon: Jonathon Bellows, MD;  Location: [facility] ENDOSCOPY;  Service: Gastroenterology;  Laterality: N/A;  POSITIVE 3/23   COLONOSCOPY WITH PROPOFOL N/A 09/27/2022   Procedure: COLONOSCOPY WITH PROPOFOL;  Surgeon: Jonathon Bellows, MD;  Location: [facility] ENDOSCOPY;  Service: Gastroenterology;  Laterality: N/A;   CONTINUOUS NERVE MONITORING N/A 01/07/2021   Procedure: FACIAL NERVE MONITORING;  Surgeon: Carloyn Manner, MD;  Location: [place];  Service: ENT;  Laterality: N/A;   EAR CYST EXCISION Right 01/07/2021   Procedure: EXCISION EPIDERMAL INCLUSION CYST;  Surgeon: Carloyn Manner, MD;  Location: [place];  Service: ENT;  Laterality: Right;   HERNIA REPAIR Left    inguinal   HYDROCELE EXCISION Right 02/09/2022   Procedure: HYDROCELECTOMY ADULT;  Surgeon: Abbie Sons, MD;  Location: [facility] ORS;  Service: Urology;  Laterality: Right;  KNEE ARTHROSCOPY Right 1984   PROSTATE SURGERY     due to prostate cancer    Family History  Problem Relation Age of Onset   Leukemia Mother    Arthritis Mother    Diabetes Mother    Alcohol abuse Father    COPD Father    Arthritis Maternal Grandmother    Diabetes Maternal Grandmother    Hearing loss Maternal Grandmother    Arthritis Maternal Grandfather    Heart attack Maternal Grandfather 52   Diabetes Maternal Grandfather    Heart disease Maternal Grandfather    Hyperlipidemia Maternal Grandfather    Hypertension Maternal Grandfather    Prostate cancer Maternal Grandfather     Prostate cancer Maternal Uncle    Diabetes Maternal Uncle    Prostate cancer Cousin    Diabetes Maternal Uncle     Social History   Socioeconomic History   Marital status: Married    Spouse name: [name]   Number of children: 2   Years of education: associates degree   Highest education level: Not on file  Occupational History   Not on file  Tobacco Use   Smoking status: Former    Packs/day: 1.00    Years: 30.00    Additional pack years: 0.00    Total pack years: 30.00    Types: Cigarettes    Quit date: 12/2000    Years since quitting: 22.1    Passive exposure: Never   Smokeless tobacco: Never  Vaping Use   Vaping Use: Never used  Substance and Sexual Activity   Alcohol use: Not Currently    Comment: once in a blue moon   Drug use: Never   Sexual activity: Yes    Birth control/protection: Post-menopausal, Surgical  Other Topics Concern   Not on file  Social History Narrative   11/05/19   From: yes, born in [place]   Living: with [name]   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      Family: 2 daughters - Programmer, applications and [name] - grandchildren - 17 (most are adults)      Enjoys: gym, working around [employer], stay active - shopping      Exercise: 5 days a week   Diet: better than it used to be, avoids meat - lots of veggies and avoids sweets, mostly water      Safety   Seat belts: Yes    Guns: Yes  and secure   Safe in relationships: Yes    Social Determinants of Health   Financial Resource Strain: Low Risk  (08/03/2022)   Overall Financial Resource Strain (CARDIA)    Difficulty of Paying Living Expenses: Not hard at all  Food Insecurity: No Food Insecurity (08/03/2022)   Hunger Vital Sign    Worried About Running Out of Food in the Last Year: Never true    Ran Out of Food in the Last Year: Never true  Transportation Needs: No Transportation Needs (08/03/2022)   PRAPARE - Hydrologist (Medical): No    Lack of Transportation (Non-Medical): No   Physical Activity: Sufficiently Active (08/03/2022)   Exercise Vital Sign    Days of Exercise per Week: 5 days    Minutes of Exercise per Session: 60 min  Stress: No Stress Concern Present (08/03/2022)   [place]    Feeling of Stress : Not at all  Social Connections: Moderately Isolated (08/03/2022)   Social Connection and Isolation  Panel [NHANES]    Frequency of Communication with Friends and Family: More than three times a week    Frequency of Social Gatherings with Friends and Family: More than three times a week    Attends Religious Services: Never    Marine scientist or Organizations: No    Attends Archivist Meetings: Never    Marital Status: Married  Human resources officer Violence: Not At Risk (08/03/2022)   Humiliation, Afraid, Rape, and Kick questionnaire    Fear of Current or Ex-Partner: No    Emotionally Abused: No    Physically Abused: No    Sexually Abused: No   Review of Systems     Objective:   Physical Exam Constitutional:      Appearance: Normal appearance.  Musculoskeletal:     Comments: ?very slight limb length difference (left very slightly longer) No ankle swelling No tenderness malleoli/Achilles, etc No pain with full resisted dorsiflexion or plantar flexion Normal ROM around ankles  Neurological:     Mental Status: He is alert.            Assessment & Plan:

## 2023-02-21 NOTE — Assessment & Plan Note (Signed)
Strain seems better Done with boot Okay to reintroduce resistance work--with care----pain is the guide Heat/tylenol prn

## 2023-02-27 ENCOUNTER — Other Ambulatory Visit: Payer: Self-pay | Admitting: Internal Medicine

## 2023-02-27 DIAGNOSIS — E1169 Type 2 diabetes mellitus with other specified complication: Secondary | ICD-10-CM

## 2023-03-02 ENCOUNTER — Encounter: Payer: Self-pay | Admitting: Otolaryngology

## 2023-03-04 NOTE — Anesthesia Preprocedure Evaluation (Addendum)
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 Patient reports he can see "fine" from left eye.  Dental no notable dental hx.    Pulmonary COPD,  COPD inhaler, former smoker   Pulmonary exam normal breath sounds clear to auscultation       Cardiovascular + CAD  Normal cardiovascular exam Rhythm:Regular Rate:Normal  Patient denies hypertension   Neuro/Psych negative neurological ROS  negative psych ROS   GI/Hepatic negative GI ROS,,,(+) Hepatitis -Patient doesn't know what kind of hepatitis he had; when he was 67 years old, "My eyes turned yellow."   Endo/Other  diabetes, Type 2, Oral Hypoglycemic Agents    Renal/GU negative Renal ROS   Hx prostate cancer negative genitourinary   Musculoskeletal  (+) Arthritis , Osteoarthritis,    Abdominal   Peds negative pediatric ROS (+)  Hematology negative hematology ROS (+)   Anesthesia Other Findings Primary open angle Glaucoma History of chicken pox  Diabetes mellitus without complication Prostate cancer  COPD, mild Pure hypercholesterolemia  Arthritis H/O hepatitis     Reproductive/Obstetrics negative OB ROS                             Anesthesia Physical Anesthesia Plan  ASA: 2  Anesthesia Plan:    Post-op Pain Management:    Induction: Intravenous  PONV Risk Score and Plan:   Airway Management Planned: LMA  Additional Equipment:   Intra-op Plan:   Post-operative Plan: Extubation in OR  Informed Consent: I have reviewed the patients History and Physical, chart, labs and discussed the procedure including the risks, benefits and  alternatives for the proposed anesthesia with the patient or authorized representative who has indicated his/her understanding and acceptance.     Dental Advisory Given  Plan Discussed with: Anesthesiologist, CRNA and Surgeon  Anesthesia Plan Comments: (Patient consented for risks of anesthesia including but not limited to:  - adverse reactions to medications - damage to eyes, teeth, lips or other oral mucosa - nerve damage due to positioning  - sore throat or hoarseness - Damage to heart, brain, nerves, lungs, other parts of body or loss of life  Patient voiced understanding.)       Anesthesia Quick Evaluation

## 2023-03-11 ENCOUNTER — Ambulatory Visit: Payer: Medicare Other | Admitting: Anesthesiology

## 2023-03-11 ENCOUNTER — Encounter: Payer: Self-pay | Admitting: Otolaryngology

## 2023-03-11 ENCOUNTER — Encounter
Admission: RE | Disposition: A | Discharge status: Home / Self Care | Payer: Self-pay | Source: Home / Self Care | Attending: Otolaryngology

## 2023-03-11 ENCOUNTER — Ambulatory Visit
Admission: RE | Admit: 2023-03-11 | Discharge: 2023-03-11 | Disposition: A | Discharge status: Home / Self Care | Payer: Medicare Other | Attending: Otolaryngology | Admitting: Otolaryngology

## 2023-03-11 ENCOUNTER — Other Ambulatory Visit: Payer: Self-pay

## 2023-03-11 DIAGNOSIS — Z79899 Other long term (current) drug therapy: Secondary | ICD-10-CM | POA: Insufficient documentation

## 2023-03-11 DIAGNOSIS — L720 Epidermal cyst: Secondary | ICD-10-CM | POA: Diagnosis not present

## 2023-03-11 DIAGNOSIS — E119 Type 2 diabetes mellitus without complications: Secondary | ICD-10-CM | POA: Insufficient documentation

## 2023-03-11 DIAGNOSIS — Z7984 Long term (current) use of oral hypoglycemic drugs: Secondary | ICD-10-CM | POA: Diagnosis not present

## 2023-03-11 DIAGNOSIS — R220 Localized swelling, mass and lump, head: Secondary | ICD-10-CM | POA: Diagnosis not present

## 2023-03-11 DIAGNOSIS — I2510 Atherosclerotic heart disease of native coronary artery without angina pectoris: Secondary | ICD-10-CM | POA: Diagnosis not present

## 2023-03-11 DIAGNOSIS — J449 Chronic obstructive pulmonary disease, unspecified: Secondary | ICD-10-CM | POA: Insufficient documentation

## 2023-03-11 HISTORY — PX: EXCISION MASS HEAD: SHX6702

## 2023-03-11 HISTORY — DX: Personal history of other infectious and parasitic diseases: Z86.19

## 2023-03-11 LAB — GLUCOSE, CAPILLARY: Glucose-Capillary: 137 mg/dL — ABNORMAL HIGH (ref 70–99)

## 2023-03-11 SURGERY — EXCISION, MASS, HEAD
Anesthesia: General | Site: Face | Laterality: Right

## 2023-03-11 MED ORDER — FENTANYL CITRATE (PF) 100 MCG/2ML IJ SOLN
INTRAMUSCULAR | Status: DC | PRN
  Administered 2023-03-11 (×2): 50 ug via INTRAVENOUS

## 2023-03-11 MED ORDER — LIDOCAINE HCL (CARDIAC) PF 100 MG/5ML IV SOSY
PREFILLED_SYRINGE | INTRAVENOUS | Status: DC | PRN
  Administered 2023-03-11: 50 mg via INTRATRACHEAL

## 2023-03-11 MED ORDER — BACITRACIN 500 UNIT/GM EX OINT: 1.0000 | TOPICAL_OINTMENT | Freq: Two times a day (BID) | CUTANEOUS | 0 refills | Status: DC

## 2023-03-11 MED ORDER — LIDOCAINE-EPINEPHRINE 1 %-1:100000 IJ SOLN
INTRAMUSCULAR | Status: DC | PRN
  Administered 2023-03-11: 1 mL

## 2023-03-11 MED ORDER — ONDANSETRON HCL 4 MG/2ML IJ SOLN
INTRAMUSCULAR | Status: DC | PRN
  Administered 2023-03-11: 4 mg via INTRAVENOUS

## 2023-03-11 MED ORDER — LACTATED RINGERS IV SOLN: INTRAVENOUS | Status: DC

## 2023-03-11 MED ORDER — BACITRACIN 500 UNIT/GM EX OINT
TOPICAL_OINTMENT | CUTANEOUS | Status: DC | PRN
  Administered 2023-03-11: 1 via TOPICAL

## 2023-03-11 MED ORDER — MIDAZOLAM HCL 5 MG/5ML IJ SOLN
INTRAMUSCULAR | Status: DC | PRN
  Administered 2023-03-11: 1 mg via INTRAVENOUS

## 2023-03-11 MED ORDER — EPHEDRINE SULFATE (PRESSORS) 50 MG/ML IJ SOLN
INTRAMUSCULAR | Status: DC | PRN
  Administered 2023-03-11 (×2): 15 mg via INTRAVENOUS

## 2023-03-11 MED ORDER — ONDANSETRON HCL 4 MG PO TABS: 4.0000 mg | ORAL_TABLET | Freq: Three times a day (TID) | ORAL | 0 refills | Status: DC | PRN

## 2023-03-11 MED ORDER — DEXAMETHASONE SODIUM PHOSPHATE 4 MG/ML IJ SOLN
INTRAMUSCULAR | Status: DC | PRN
  Administered 2023-03-11: 4 mg via INTRAVENOUS

## 2023-03-11 MED ORDER — PROPOFOL 10 MG/ML IV BOLUS
INTRAVENOUS | Status: DC | PRN
  Administered 2023-03-11: 200 mg via INTRAVENOUS

## 2023-03-11 SURGICAL SUPPLY — 24 items
APL FBRTP 3 NS LF CTTN WD (MISCELLANEOUS) ×4
APPLICATOR COTTON TIP 3IN (MISCELLANEOUS) ×3 IMPLANT
APPLICATOR COTTON TIP WD 3 STR (MISCELLANEOUS) ×1 IMPLANT
CORD BIP STRL DISP 12FT (MISCELLANEOUS) ×1 IMPLANT
ELECT CAUTERY NDL 2.0 MIC (NEEDLE) ×1 IMPLANT
ELECT CAUTERY NEEDLE 2.0 MIC (NEEDLE) ×2 IMPLANT
GAUZE SPONGE 4X4 12PLY STRL (GAUZE/BANDAGES/DRESSINGS) ×1 IMPLANT
GLOVE SURG GAMMEX PI TX LF 7.5 (GLOVE) ×4 IMPLANT
GOWN STRL REUS W/ TWL LRG LVL3 (GOWN DISPOSABLE) ×2 IMPLANT
GOWN STRL REUS W/TWL LRG LVL3 (GOWN DISPOSABLE) ×2
KIT TURNOVER KIT A (KITS) ×2 IMPLANT
NS IRRIG 500ML POUR BTL (IV SOLUTION) ×2 IMPLANT
PACK ENT CUSTOM (PACKS) ×2 IMPLANT
PROBE NEUROSIGN BIPOL (MISCELLANEOUS) ×1 IMPLANT
PROBE NEUROSIGN BIPOLAR (MISCELLANEOUS) ×2
SOL PREP PVP 2OZ (MISCELLANEOUS) ×2
SOLUTION PREP PVP 2OZ (MISCELLANEOUS) ×2 IMPLANT
STRAP BODY AND KNEE 60X3 (MISCELLANEOUS) ×2 IMPLANT
SUCTION FRAZIER HANDLE 10FR (MISCELLANEOUS) ×2
SUCTION TUBE FRAZIER 10FR DISP (MISCELLANEOUS) ×1 IMPLANT
SUT PROLENE 6 0 P 1 18 (SUTURE) ×1 IMPLANT
SUT VIC AB 4-0 PS2 18 (SUTURE) ×1 IMPLANT
SYR 10ML LL (SYRINGE) ×2 IMPLANT
SYR 20ML LL LF (SYRINGE) ×1 IMPLANT

## 2023-03-11 NOTE — Anesthesia Procedure Notes (Signed)
Procedure Name: LMA Insertion Date/Time: 03/11/2023 12:05 PM  Performed by: Jeannene Patella, CRNAPre-anesthesia Checklist: Patient identified, Emergency Drugs available, Suction available, Patient being monitored and Timeout performed Patient Re-evaluated:Patient Re-evaluated prior to induction Oxygen Delivery Method: Circle system utilized Preoxygenation: Pre-oxygenation with 100% oxygen Induction Type: IV induction Ventilation: Mask ventilation without difficulty LMA: LMA inserted LMA Size: 4.0 Number of attempts: 1 Placement Confirmation: positive ETCO2 and breath sounds checked- equal and bilateral Tube secured with: Tape Dental Injury: Teeth and Oropharynx as per pre-operative assessment

## 2023-03-11 NOTE — Transfer of Care (Signed)
Immediate Anesthesia Transfer of Care Note  Patient: Alexander Dixon  Procedure(s) Performed: MINOR EXCISION OF FACIAL CYST (Bilateral: Face)  Patient Location: PACU  Anesthesia Type: No value filed.  Level of Consciousness: awake, alert  and patient cooperative  Airway and Oxygen Therapy: Patient Spontanous Breathing and Patient connected to supplemental oxygen  Post-op Assessment: Post-op Vital signs reviewed, Patient's Cardiovascular Status Stable, Respiratory Function Stable, Patent Airway and No signs of Nausea or vomiting  Post-op Vital Signs: Reviewed and stable  Complications: No notable events documented.

## 2023-03-11 NOTE — H&P (Signed)
..  History and Physical paper copy reviewed and updated date of procedure and will be scanned into system.  Patient seen and examined and marked.  

## 2023-03-11 NOTE — Anesthesia Postprocedure Evaluation (Signed)
Anesthesia Post Note  Patient: Alexander Dixon  Procedure(s) Performed: MINOR EXCISION OF FACIAL CYST (Bilateral: Face)  Patient location during evaluation: PACU Anesthesia Type: General Level of consciousness: awake and alert Pain management: pain level controlled Vital Signs Assessment: post-procedure vital signs reviewed and stable Respiratory status: spontaneous breathing, nonlabored ventilation, respiratory function stable and patient connected to nasal cannula oxygen Cardiovascular status: blood pressure returned to baseline and stable Postop Assessment: no apparent nausea or vomiting Anesthetic complications: no   No notable events documented.   Last Vitals:  Vitals:   03/11/23 1249 03/11/23 1300  BP: (!) 151/71 (!) 162/68  Pulse: 84 80  Resp: 14 15  Temp: 36.4 C 36.4 C  SpO2: 96% 96%    Last Pain:  Vitals:   03/11/23 1300  TempSrc:   PainSc: 3                  Roxane [place] C Azreal Stthomas

## 2023-03-11 NOTE — Op Note (Signed)
..  Marlan [phone] Rockville StreetLeslye [phone]

## 2023-03-14 ENCOUNTER — Encounter: Payer: Self-pay | Admitting: Otolaryngology

## 2023-03-15 LAB — SURGICAL PATHOLOGY

## 2023-03-22 DIAGNOSIS — H401132 Primary open-angle glaucoma, bilateral, moderate stage: Secondary | ICD-10-CM | POA: Diagnosis not present

## 2023-03-25 DIAGNOSIS — H401132 Primary open-angle glaucoma, bilateral, moderate stage: Secondary | ICD-10-CM | POA: Diagnosis not present

## 2023-03-25 DIAGNOSIS — H26493 Other secondary cataract, bilateral: Secondary | ICD-10-CM | POA: Diagnosis not present

## 2023-03-25 DIAGNOSIS — E119 Type 2 diabetes mellitus without complications: Secondary | ICD-10-CM | POA: Diagnosis not present

## 2023-03-25 LAB — HM DIABETES EYE EXAM

## 2023-04-14 ENCOUNTER — Encounter: Payer: Self-pay | Admitting: Internal Medicine

## 2023-04-14 NOTE — Telephone Encounter (Signed)
Faxed request for notes to [facility].

## 2023-06-06 DIAGNOSIS — K136 Irritative hyperplasia of oral mucosa: Secondary | ICD-10-CM | POA: Diagnosis not present

## 2023-06-20 ENCOUNTER — Other Ambulatory Visit: Payer: Medicare Other

## 2023-06-20 ENCOUNTER — Other Ambulatory Visit: Payer: Self-pay

## 2023-06-20 DIAGNOSIS — C61 Malignant neoplasm of prostate: Secondary | ICD-10-CM

## 2023-06-21 LAB — PSA: Prostate Specific Ag, Serum: <0.1 ng/mL (ref 0.0–4.0)

## 2023-06-29 ENCOUNTER — Encounter (INDEPENDENT_AMBULATORY_CARE_PROVIDER_SITE_OTHER): Payer: Self-pay

## 2023-07-14 ENCOUNTER — Encounter (INDEPENDENT_AMBULATORY_CARE_PROVIDER_SITE_OTHER): Payer: Self-pay

## 2023-07-28 ENCOUNTER — Encounter: Payer: Self-pay | Admitting: Internal Medicine

## 2023-07-28 ENCOUNTER — Ambulatory Visit (INDEPENDENT_AMBULATORY_CARE_PROVIDER_SITE_OTHER): Payer: Medicare Other | Admitting: Internal Medicine

## 2023-07-28 VITALS — BP 138/82 | HR 70 | Temp 97.4°F | Ht 71.0 in | Wt 191.0 lb

## 2023-07-28 DIAGNOSIS — J449 Chronic obstructive pulmonary disease, unspecified: Secondary | ICD-10-CM

## 2023-07-28 DIAGNOSIS — E1159 Type 2 diabetes mellitus with other circulatory complications: Secondary | ICD-10-CM

## 2023-07-28 DIAGNOSIS — E119 Type 2 diabetes mellitus without complications: Secondary | ICD-10-CM | POA: Insufficient documentation

## 2023-07-28 DIAGNOSIS — Z7984 Long term (current) use of oral hypoglycemic drugs: Secondary | ICD-10-CM

## 2023-07-28 DIAGNOSIS — Z8546 Personal history of malignant neoplasm of prostate: Secondary | ICD-10-CM

## 2023-07-28 DIAGNOSIS — I6521 Occlusion and stenosis of right carotid artery: Secondary | ICD-10-CM

## 2023-07-28 DIAGNOSIS — R32 Unspecified urinary incontinence: Secondary | ICD-10-CM

## 2023-07-28 DIAGNOSIS — Z0000 Encounter for general adult medical examination without abnormal findings: Secondary | ICD-10-CM

## 2023-07-28 LAB — LIPID PANEL
Cholesterol: 150 mg/dL (ref 0–200)
HDL: 42 mg/dL (ref >39.00)
LDL Cholesterol: 90 mg/dL (ref 0–99)
NonHDL: 107.93
Total CHOL/HDL Ratio: 4
Triglycerides: 89 mg/dL (ref 0.0–149.0)
VLDL: 17.8 mg/dL (ref 0.0–40.0)

## 2023-07-28 LAB — CBC
HCT: 43.3 % (ref 39.0–52.0)
Hemoglobin: 14.1 g/dL (ref 13.0–17.0)
MCHC: 32.5 g/dL (ref 30.0–36.0)
MCV: 84.2 fl (ref 78.0–100.0)
Platelets: 270 10*3/uL (ref 150.0–400.0)
RBC: 5.14 Mil/uL (ref 4.22–5.81)
RDW: 14.9 % (ref 11.5–15.5)
WBC: 5.3 10*3/uL (ref 4.0–10.5)

## 2023-07-28 LAB — COMPREHENSIVE METABOLIC PANEL
ALT: 14 U/L (ref 0–53)
AST: 16 U/L (ref 0–37)
Albumin: 4.3 g/dL (ref 3.5–5.2)
Alkaline Phosphatase: 66 U/L (ref 39–117)
BUN: 17 mg/dL (ref 6–23)
CO2: 31 mEq/L (ref 19–32)
Calcium: 9.6 mg/dL (ref 8.4–10.5)
Chloride: 102 mEq/L (ref 96–112)
Creatinine, Ser: 0.89 mg/dL (ref 0.40–1.50)
GFR: 87.95 mL/min (ref >60.00)
Glucose, Bld: 127 mg/dL — ABNORMAL HIGH (ref 70–99)
Potassium: 4.8 mEq/L (ref 3.5–5.1)
Sodium: 140 mEq/L (ref 135–145)
Total Bilirubin: 0.6 mg/dL (ref 0.2–1.2)
Total Protein: 7.2 g/dL (ref 6.0–8.3)

## 2023-07-28 LAB — MICROALBUMIN / CREATININE URINE RATIO
Creatinine,U: 79.6 mg/dL
Microalb Creat Ratio: 0.9 mg/g (ref 0.0–30.0)
Microalb, Ur: 0.7 mg/dL (ref 0.0–1.9)

## 2023-07-28 LAB — HEMOGLOBIN A1C: Hgb A1c MFr Bld: 7.2 % — ABNORMAL HIGH (ref 4.6–6.5)

## 2023-07-28 NOTE — Assessment & Plan Note (Signed)
Has good control on metformin 1000 bid Will check labs

## 2023-07-28 NOTE — Progress Notes (Signed)
Subjective:    Patient ID: Alexander Dixon, male    DOB: 1955-12-12, 67 y.o.   MRN: [id number]  HPI Here for Medicare wellness visit and follow up of chronic health conditions Reviewed advanced directives Reviewed other doctors---Dr Stoioff--urology, Dr Anna---GI, Dr Thomasene Lot, Dr Shelton Silvas, LTR dental Had excision of cyst by Dr Reatha Armour other surgery. No hospitalizations Exercises regularly--5 days per week (cardio/weights) Vision is okay Hearing is good No alcohol or tobacco No falls No depression or anhedonia Independent with instrumental ADLs No sig memory issues  Checks sugars every morning Usually under 120 No trouble with metformin  No chest pain or SOB No dizziness or syncope No palpitations No edema No headaches  Voids okay Some incontinence since prostatectomy---wears pad (or clamp for extended times)  Current Outpatient Medications on File Prior to Visit  Medication Sig Dispense Refill   Aromatic Inhalants (VICKS VAPOINHALER) INHA Inhale 1 puff into the lungs daily as needed (congestion).     Blood Glucose Monitoring Suppl (ONETOUCH VERIO FLEX SYSTEM) w/Device KIT See admin instructions.     Cholecalciferol (VITAMIN D3) 50 MCG (2000 UT) TABS Take 2,000 Units by mouth daily.      glucose blood (ONETOUCH VERIO) test strip Use one strip daily to check blood sugar Dx Code E11.9 100 each 12   Ibuprofen-diphenhydrAMINE HCl (ADVIL PM) 200-25 MG CAPS Take 1 capsule by mouth at bedtime as needed (sleep).     Lancet Devices (ONE TOUCH DELICA LANCING DEV) MISC 1 each by Does not apply route daily at 2 PM. 100 each 3   Lancets (ONETOUCH DELICA PLUS LANCET33G) MISC USE TO CHECK BLOOD SUGAR TWICE  DAILY 200 each 3   latanoprost (XALATAN) 0.005 % ophthalmic solution Place 1 drop into both eyes at bedtime.      metFORMIN (GLUCOPHAGE) 1000 MG tablet Take 1 tablet (1,000 mg total) by mouth 2 (two) times daily. 180 tablet 3   Minoxidil (ROGAINE MENS EXTRA  STRENGTH) 5 % FOAM Apply 1 application. topically 2 (two) times daily.     rosuvastatin (CRESTOR) 5 MG tablet TAKE 1 TABLET BY MOUTH THREE TIMES A WEEK 36 tablet 1   sildenafil (VIAGRA) 100 MG tablet Take 1 tablet (100 mg total) by mouth daily as needed for erectile dysfunction. 30 tablet 5   No current facility-administered medications on file prior to visit.    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   FVC 71% and FEV1 73% untreated   Diabetes mellitus without complication [facility])    H/O hepatitis    age 21   History of chicken pox    Prostate cancer ([facility]) 2020   Pure hypercholesterolemia 11/21/2019    Past Surgical History:  Procedure Laterality Date   COLONOSCOPY WITH PROPOFOL N/A 04/21/2020   Procedure: COLONOSCOPY WITH PROPOFOL;  Surgeon: Wyline Mood, MD;  Location: [facility] ENDOSCOPY;  Service: Gastroenterology;  Laterality: N/A;  POSITIVE 3/23   COLONOSCOPY WITH PROPOFOL N/A 09/27/2022   Procedure: COLONOSCOPY WITH PROPOFOL;  Surgeon: Wyline Mood, MD;  Location: [facility] ENDOSCOPY;  Service: Gastroenterology;  Laterality: N/A;   CONTINUOUS NERVE MONITORING N/A 01/07/2021   Procedure: FACIAL NERVE MONITORING;  Surgeon: Bud Face, MD;  Location: [facility] SURGERY CNTR;  Service: ENT;  Laterality: N/A;   EAR CYST EXCISION Right 01/07/2021   Procedure: EXCISION EPIDERMAL INCLUSION CYST;  Surgeon: Bud Face, MD;  Location: [facility] SURGERY CNTR;  Service: ENT;  Laterality: Right;   EXCISION MASS HEAD Right 03/11/2023   Procedure: Excision epidermal inclusion cyst with facial nerve monitoring;  Surgeon: Bud Face, MD;  Location: [facility] SURGERY CNTR;  Service: ENT;  Laterality: Right;  diabetic, RIGHT SIDE   HERNIA REPAIR Left    inguinal   HYDROCELE EXCISION Right 02/09/2022   Procedure: HYDROCELECTOMY ADULT;  Surgeon: Riki Altes, MD;  Location: [facility] ORS;  Service: Urology;  Laterality:  Right;   KNEE ARTHROSCOPY Right 1984   PROSTATE SURGERY     due to prostate cancer    Family History  Problem Relation Age of Onset   Leukemia Mother    Arthritis Mother    Diabetes Mother    Alcohol abuse Father    COPD Father    Arthritis Maternal Grandmother    Diabetes Maternal Grandmother    Hearing loss Maternal Grandmother    Arthritis Maternal Grandfather    Heart attack Maternal Grandfather 68   Diabetes Maternal Grandfather    Heart disease Maternal Grandfather    Hyperlipidemia Maternal Grandfather    Hypertension Maternal Grandfather    Prostate cancer Maternal Grandfather    Prostate cancer Maternal Uncle    Diabetes Maternal Uncle    Prostate cancer Cousin    Diabetes Maternal Uncle     Social History   Socioeconomic History   Marital status: Married    Spouse name: Natalia Leatherwood   Number of children: 2   Years of education: associates degree   Highest education level: Not on file  Occupational History   Not on file  Tobacco Use   Smoking status: Former    Current packs/day: 0.00    Average packs/day: 1 pack/day for 30.0 years (30.0 ttl pk-yrs)    Types: Cigarettes    Start date: 12/1970    Quit date: 12/2000    Years since quitting: 22.5    Passive exposure: Never   Smokeless tobacco: Never  Vaping Use   Vaping status: Never Used  Substance and Sexual Activity   Alcohol use: Not Currently    Comment: once in a blue moon   Drug use: Never   Sexual activity: Yes    Birth control/protection: Post-menopausal, Surgical  Other Topics Concern   Not on file  Social History Narrative   11/05/19   From: yes, born in [place]   Living: with Natalia Leatherwood   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      Family: 2 daughters - Photographer and Misty Stanley - grandchildren - 4 (most are adults)      Enjoys: gym, working around [employer], stay active - shopping      Exercise: 5 days a week   Diet: better than it used to be, avoids meat - lots of veggies and avoids sweets, mostly  water      No living will   Wife then daughter Lauren--should be medical decision makers   Would accept resuscitation   No extended feeding tube--not if cognitively unaware   Social Determinants of Health   Financial Resource Strain: Low Risk  (08/03/2022)   Overall Financial Resource Strain (CARDIA)    Difficulty of Paying Living Expenses: Not hard at all  Food Insecurity: No Food Insecurity (08/03/2022)   Hunger Vital Sign    Worried About Running Out of Food in the Last Year: Never true    Ran Out of Food in the Last Year: Never true  Transportation Needs: No Transportation Needs (08/03/2022)   PRAPARE - Transportation  Lack of Transportation (Medical): No    Lack of Transportation (Non-Medical): No  Physical Activity: Sufficiently Active (08/03/2022)   Exercise Vital Sign    Days of Exercise per Week: 5 days    Minutes of Exercise per Session: 60 min  Stress: No Stress Concern Present (08/03/2022)   [employer] of Occupational Health - Occupational Stress Questionnaire    Feeling of Stress : Not at all  Social Connections: Moderately Isolated (08/03/2022)   Social Connection and Isolation Panel [NHANES]    Frequency of Communication with Friends and Family: More than three times a week    Frequency of Social Gatherings with Friends and Family: More than three times a week    Attends Religious Services: Never    Database administrator or Organizations: No    Attends Banker Meetings: Never    Marital Status: Married  Catering manager Violence: Not At Risk (08/03/2022)   Humiliation, Afraid, Rape, and Kick questionnaire    Fear of Current or Ex-Partner: No    Emotionally Abused: No    Physically Abused: No    Sexually Abused: No   Review of Systems Appetite is okay Weight is stable Sleeps well for the most part--rarely uses advil pm Wears seat belt Teeth are fine---keeps up with dentist (had callus removed from gums recently--Dr Adriana Simas) No suspicious skin  lesions No heartburn or dysphagia Bowels move fine--no blood No sig back or joint pains    Objective:   Physical Exam Constitutional:      Appearance: Normal appearance.  HENT:     Mouth/Throat:     Pharynx: No oropharyngeal exudate or posterior oropharyngeal erythema.  Eyes:     Conjunctiva/sclera: Conjunctivae normal.     Pupils: Pupils are equal, round, and reactive to light.     Comments: Left esophoria  Cardiovascular:     Rate and Rhythm: Normal rate and regular rhythm.     Pulses: Normal pulses.     Heart sounds: No murmur heard.    No gallop.  Pulmonary:     Effort: Pulmonary effort is normal.     Breath sounds: Normal breath sounds. No wheezing or rales.  Abdominal:     Palpations: Abdomen is soft.     Tenderness: There is no abdominal tenderness.  Musculoskeletal:     Cervical back: Neck supple.     Right lower leg: No edema.     Left lower leg: No edema.  Lymphadenopathy:     Cervical: No cervical adenopathy.  Skin:    Findings: No lesion or rash.  Neurological:     General: No focal deficit present.     Mental Status: He is alert and oriented to person, place, and time.     Comments: Word naming---12/1 minute Recall 1/3  Psychiatric:        Mood and Affect: Mood normal.        Behavior: Behavior normal.            Assessment & Plan:

## 2023-07-28 NOTE — Assessment & Plan Note (Signed)
Is on statin. 

## 2023-07-28 NOTE — Progress Notes (Signed)
Hearing Screening - Comments:: Passed whisper test Vision Screening - Comments:: May 2024  

## 2023-07-28 NOTE — Assessment & Plan Note (Signed)
I have personally reviewed the Medicare Annual Wellness questionnaire and have noted 1. The patient's medical and social history 2. Their use of alcohol, tobacco or illicit drugs 3. Their current medications and supplements 4. The patient's functional ability including ADL's, fall risks, home safety risks and hearing or visual             impairment. 5. Diet and physical activities 6. Evidence for depression or mood disorders  The patients weight, height, BMI and visual acuity have been recorded in the chart I have made referrals, counseling and provided education to the patient based review of the above and I have provided the pt with a written personalized care plan for preventive services.  I have provided you with a copy of your personalized plan for preventive services. Please take the time to review along with your updated medication list.  Healthy Needs another colonoscopy--poor prep (plans next year) Exercises regularly Does not want any immunizations

## 2023-07-28 NOTE — Assessment & Plan Note (Signed)
Uses pads and clamp if out for extended time

## 2023-07-28 NOTE — Assessment & Plan Note (Signed)
Past diagnosis ---but quit smoking 20 years ago No regular cough or respiratory symptoms

## 2023-07-28 NOTE — Assessment & Plan Note (Signed)
Recent PSA essentially 0

## 2023-08-08 NOTE — Progress Notes (Signed)
This encounter was created in error - please disregard. AWV completed by PCP 07/28/23

## 2023-08-22 ENCOUNTER — Other Ambulatory Visit: Payer: Self-pay | Admitting: Internal Medicine

## 2023-08-22 ENCOUNTER — Ambulatory Visit (INDEPENDENT_AMBULATORY_CARE_PROVIDER_SITE_OTHER): Payer: Medicare Other

## 2023-08-22 VITALS — Ht 71.0 in | Wt 191.0 lb

## 2023-08-22 DIAGNOSIS — Z0000 Encounter for general adult medical examination without abnormal findings: Secondary | ICD-10-CM | POA: Diagnosis not present

## 2023-08-22 DIAGNOSIS — E785 Hyperlipidemia, unspecified: Secondary | ICD-10-CM

## 2023-08-22 NOTE — Patient Instructions (Signed)
Mr. Nicodemus , Thank you for taking time to come for your Medicare Wellness Visit. I appreciate your ongoing commitment to your health goals. Please review the following plan we discussed and let me know if I can assist you in the future.   Referrals/Orders/Follow-Ups/Clinician Recommendations: Aim for 30 minutes of exercise or brisk walking, 6-8 glasses of water, and 5 servings of fruits and vegetables each day.  This is a list of the screening recommended for you and due dates:  Health Maintenance  Topic Date Due   Complete foot exam   07/24/2023   Flu Shot  02/27/2024*   Hemoglobin A1C  01/27/2024   Eye exam for diabetics  03/24/2024   Yearly kidney function blood test for diabetes  07/27/2024   Yearly kidney health urinalysis for diabetes  07/27/2024   Medicare Annual Wellness Visit  08/21/2024   Colon Cancer Screening  09/28/2027   DTaP/Tdap/Td vaccine (2 - Td or Tdap) 09/23/2029   Hepatitis C Screening  Completed   HPV Vaccine  Aged Out   Pneumonia Vaccine  Discontinued   COVID-19 Vaccine  Discontinued   Zoster (Shingles) Vaccine  Discontinued  *Topic was postponed. The date shown is not the original due date.    Advanced directives: (ACP Link)Information on Advanced Care Planning can be found at [facility] Advance Health Care Directives Advance Health Care Directives ([email or web address]   Next Medicare Annual Wellness Visit scheduled for next year: Yes

## 2023-08-22 NOTE — Progress Notes (Signed)
Subjective:   Alexander Dixon is a 67 y.o. male who presents for Medicare Annual/Subsequent preventive examination.  Visit Complete: Virtual  I connected with  Alexander Dixon on 08/22/23 by a audio enabled telemedicine application and verified that I am speaking with the correct person using two identifiers.  Patient Location: Home  Provider Location: Home Office  I discussed the limitations of evaluation and management by telemedicine. The patient expressed understanding and agreed to proceed.  Vital Signs: Because this visit was a virtual/telehealth visit, some criteria may be missing or patient reported. Any vitals not documented were not able to be obtained and vitals that have been documented are patient reported.   Cardiac Risk Factors include: advanced age (>50men, >64 women);male gender;diabetes mellitus;dyslipidemia;hypertension     Objective:    Today's Vitals   08/22/23 1027  Weight: 191 lb (86.6 kg)  Height: 5\' 11"  (1.803 m)   Body mass index is 26.64 kg/m.     08/22/2023    1:50 PM 03/11/2023    9:56 AM 09/27/2022    7:46 AM 08/03/2022    2:17 PM 07/23/2022    9:11 AM 03/08/2022    4:10 PM 02/09/2022   11:07 AM  Advanced Directives  Does Patient Have a Medical Advance Directive? No No No No No No No  Would patient like information on creating a medical advance directive? Yes (MAU/Ambulatory/Procedural Areas - Information given) Yes (MAU/Ambulatory/Procedural Areas - Information given) No - Patient declined No - Patient declined Yes (MAU/Ambulatory/Procedural Areas - Information given) Yes (MAU/Ambulatory/Procedural Areas - Information given) No - Patient declined    Current Medications (verified) Outpatient Encounter Medications as of 08/22/2023  Medication Sig   Aromatic Inhalants (VICKS VAPOINHALER) INHA Inhale 1 puff into the lungs daily as needed (congestion).   Blood Glucose Monitoring Suppl (ONETOUCH VERIO FLEX SYSTEM) w/Device KIT See admin  instructions.   Cholecalciferol (VITAMIN D3) 50 MCG (2000 UT) TABS Take 2,000 Units by mouth daily.    glucose blood (ONETOUCH VERIO) test strip Use one strip daily to check blood sugar Dx Code E11.9   Ibuprofen-diphenhydrAMINE HCl (ADVIL PM) 200-25 MG CAPS Take 1 capsule by mouth at bedtime as needed (sleep).   Lancet Devices (ONE TOUCH DELICA LANCING DEV) MISC 1 each by Does not apply route daily at 2 PM.   Lancets (ONETOUCH DELICA PLUS LANCET33G) MISC USE TO CHECK BLOOD SUGAR TWICE  DAILY   latanoprost (XALATAN) 0.005 % ophthalmic solution Place 1 drop into both eyes at bedtime.    metFORMIN (GLUCOPHAGE) 1000 MG tablet Take 1 tablet (1,000 mg total) by mouth 2 (two) times daily.   Minoxidil (ROGAINE MENS EXTRA STRENGTH) 5 % FOAM Apply 1 application. topically 2 (two) times daily.   rosuvastatin (CRESTOR) 5 MG tablet TAKE 1 TABLET BY MOUTH THREE TIMES A WEEK   sildenafil (VIAGRA) 100 MG tablet Take 1 tablet (100 mg total) by mouth daily as needed for erectile dysfunction.   No facility-administered encounter medications on file as of 08/22/2023.    Allergies (verified) Patient has no known allergies.   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   FVC 71% and FEV1 73% untreated   Diabetes mellitus without complication [facility])    H/O hepatitis    age 46   History of chicken pox    Prostate cancer (HCC) 2020   Pure hypercholesterolemia 11/21/2019   Past Surgical History:  Procedure Laterality  Date   COLONOSCOPY WITH PROPOFOL N/A 04/21/2020   Procedure: COLONOSCOPY WITH PROPOFOL;  Surgeon: Wyline Mood, MD;  Location: [facility] ENDOSCOPY;  Service: Gastroenterology;  Laterality: N/A;  POSITIVE 3/23   COLONOSCOPY WITH PROPOFOL N/A 09/27/2022   Procedure: COLONOSCOPY WITH PROPOFOL;  Surgeon: Wyline Mood, MD;  Location: [facility] ENDOSCOPY;  Service: Gastroenterology;  Laterality: N/A;   CONTINUOUS NERVE MONITORING N/A 01/07/2021    Procedure: FACIAL NERVE MONITORING;  Surgeon: Bud Face, MD;  Location: [facility];  Service: ENT;  Laterality: N/A;   EAR CYST EXCISION Right 01/07/2021   Procedure: EXCISION EPIDERMAL INCLUSION CYST;  Surgeon: Bud Face, MD;  Location: [facility];  Service: ENT;  Laterality: Right;   EXCISION MASS HEAD Right 03/11/2023   Procedure: Excision epidermal inclusion cyst with facial nerve monitoring;  Surgeon: Bud Face, MD;  Location: [facility];  Service: ENT;  Laterality: Right;  diabetic, RIGHT SIDE   HERNIA REPAIR Left    inguinal   HYDROCELE EXCISION Right 02/09/2022   Procedure: HYDROCELECTOMY ADULT;  Surgeon: Riki Altes, MD;  Location: [facility] ORS;  Service: Urology;  Laterality: Right;   KNEE ARTHROSCOPY Right 1984   PROSTATE SURGERY     due to prostate cancer   Family History  Problem Relation Age of Onset   Leukemia Mother    Arthritis Mother    Diabetes Mother    Alcohol abuse Father    COPD Father    Arthritis Maternal Grandmother    Diabetes Maternal Grandmother    Hearing loss Maternal Grandmother    Arthritis Maternal Grandfather    Heart attack Maternal Grandfather 74   Diabetes Maternal Grandfather    Heart disease Maternal Grandfather    Hyperlipidemia Maternal Grandfather    Hypertension Maternal Grandfather    Prostate cancer Maternal Grandfather    Prostate cancer Maternal Uncle    Diabetes Maternal Uncle    Prostate cancer Cousin    Diabetes Maternal Uncle    Social History   Socioeconomic History   Marital status: Married    Spouse name: Natalia Leatherwood   Number of children: 2   Years of education: associates degree   Highest education level: Not on file  Occupational History   Not on file  Tobacco Use   Smoking status: Former    Current packs/day: 0.00    Average packs/day: 1 pack/day for 30.0 years (30.0 ttl pk-yrs)    Types: Cigarettes    Start date: 12/1970    Quit date: 12/2000    Years since  quitting: 22.6    Passive exposure: Never   Smokeless tobacco: Never  Vaping Use   Vaping status: Never Used  Substance and Sexual Activity   Alcohol use: Not Currently    Comment: once in a blue moon   Drug use: Never   Sexual activity: Yes    Birth control/protection: Post-menopausal, Surgical  Other Topics Concern   Not on file  Social History Narrative   11/05/19   From: yes, born in [place]   Living: with Natalia Leatherwood   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      Family: 2 daughters - Leotis Shames and Misty Stanley - grandchildren - 4 (most are adults)      Enjoys: gym, working around [employer], stay active - shopping      Exercise: 5 days a week   Diet: better than it used to be, avoids meat - lots of veggies and avoids sweets, mostly water  No living will   Wife then daughter Lauren--should be medical decision makers   Would accept resuscitation   No extended feeding tube--not if cognitively unaware   Social Determinants of Health   Financial Resource Strain: Low Risk  (08/22/2023)   Overall Financial Resource Strain (CARDIA)    Difficulty of Paying Living Expenses: Not hard at all  Food Insecurity: No Food Insecurity (08/22/2023)   Hunger Vital Sign    Worried About Running Out of Food in the Last Year: Never true    Ran Out of Food in the Last Year: Never true  Transportation Needs: No Transportation Needs (08/22/2023)   PRAPARE - Administrator, Civil Service (Medical): No    Lack of Transportation (Non-Medical): No  Physical Activity: Sufficiently Active (08/22/2023)   Exercise Vital Sign    Days of Exercise per Week: 5 days    Minutes of Exercise per Session: 60 min  Stress: No Stress Concern Present (08/22/2023)   [employer] of Occupational Health - Occupational Stress Questionnaire    Feeling of Stress : Not at all  Social Connections: Moderately Isolated (08/22/2023)   Social Connection and Isolation Panel [NHANES]    Frequency of Communication with Friends  and Family: More than three times a week    Frequency of Social Gatherings with Friends and Family: Three times a week    Attends Religious Services: Never    Active Member of Clubs or Organizations: No    Attends Engineer, structural: Never    Marital Status: Married    Tobacco Counseling Counseling given: Not Answered   Clinical Intake:  Pre-visit preparation completed: Yes  Pain : No/denies pain     Diabetes: No  How often do you need to have someone help you when you read instructions, pamphlets, or other written materials from your doctor or pharmacy?: 1 - Never  Interpreter Needed?: No  Information entered by :: Kandis Fantasia LPN   Activities of Daily Living    08/22/2023   10:35 AM 03/11/2023    9:58 AM  In your present state of health, do you have any difficulty performing the following activities:  Hearing? 0 0  Vision? 0 0  Difficulty concentrating or making decisions? 0 0  Walking or climbing stairs? 0 0  Dressing or bathing? 0 0  Doing errands, shopping? 0   Preparing Food and eating ? N   Using the Toilet? N   In the past six months, have you accidently leaked urine? N   Do you have problems with loss of bowel control? N   Managing your Medications? N   Managing your Finances? N   Housekeeping or managing your Housekeeping? N     Patient Care Team: Karie Schwalbe, MD as PCP - General (Internal Medicine) Pa,  Eye Care (Optometry)  Indicate any recent Medical Services you may have received from other than [facility] providers in the past year (date may be approximate).     Assessment:   This is a routine wellness examination for [employer].  Hearing/Vision screen Hearing Screening - Comments:: Denies hearing difficulties   Vision Screening - Comments:: Wears rx glasses - up to date with routine eye exams with [facility]     Goals Addressed             This Visit's Progress    COMPLETED: Patient Stated        12/26/2019, I will maintain and continue medications as prescribed.  COMPLETED: Patient Stated       01/30/2021, I will continue to do cardio everyday for 30 minutes     COMPLETED: Patient Stated       Continue exercise at least 5 days a week     Remain active and independent        Depression Screen    08/22/2023    1:50 PM 07/28/2023    8:20 AM 07/28/2023    8:02 AM 01/31/2023    2:27 PM 08/03/2022    2:14 PM 07/23/2022    9:57 AM 01/30/2021    2:52 PM  PHQ 2/9 Scores  PHQ - 2 Score 0 0 0 0 0 0 0  PHQ- 9 Score    0 0  0    Fall Risk    08/22/2023    1:51 PM 07/28/2023    8:20 AM 07/28/2023    8:02 AM 01/31/2023    2:27 PM 08/03/2022    2:18 PM  Fall Risk   Falls in the past year? 0 0 0 0 0  Number falls in past yr: 0  0 0 0  Injury with Fall? 0  0 0 0  Risk for fall due to : No Fall Risks  No Fall Risks No Fall Risks No Fall Risks  Follow up Falls prevention discussed;Education provided;Falls evaluation completed  Falls evaluation completed Falls evaluation completed Falls prevention discussed    MEDICARE RISK AT HOME: Medicare Risk at Home Any stairs in or around the home?: Yes If so, are there any without handrails?: No Home free of loose throw rugs in walkways, pet beds, electrical cords, etc?: Yes Adequate lighting in your home to reduce risk of falls?: Yes Life alert?: No Use of a cane, walker or w/c?: No Grab bars in the bathroom?: Yes Shower chair or bench in shower?: No Elevated toilet seat or a handicapped toilet?: No  TIMED UP AND GO:  Was the test performed?  No    Cognitive Function:    01/30/2021    2:55 PM 12/26/2019    2:08 PM  MMSE - Mini Mental State Exam  Not completed: Refused   Orientation to time  5  Orientation to Place  5  Registration  3  Attention/ Calculation  5  Recall  3  Language- repeat  1        08/22/2023    1:52 PM 08/03/2022    2:21 PM  6CIT Screen  What Year? 0 points 0 points  What month? 0 points 0 points  What time? 0  points 0 points  Count back from 20 0 points 0 points  Months in reverse 0 points 0 points  Repeat phrase 0 points 0 points  Total Score 0 points 0 points    Immunizations Immunization History  Administered Date(s) Administered   Tdap 09/24/2019    TDAP status: Up to date  Flu Vaccine status: Declined, Education has been provided regarding the importance of this vaccine but patient still declined. Advised may receive this vaccine at local pharmacy or Health Dept. Aware to provide a copy of the vaccination record if obtained from local pharmacy or Health Dept. Verbalized acceptance and understanding.  Pneumococcal vaccine status: Declined,  Education has been provided regarding the importance of this vaccine but patient still declined. Advised may receive this vaccine at local pharmacy or Health Dept. Aware to provide a copy of the vaccination record if obtained from local pharmacy or Health Dept. Verbalized acceptance and understanding.  Covid-19 vaccine status: Declined, Education has been provided regarding the importance of this vaccine but patient still declined. Advised may receive this vaccine at local pharmacy or Health Dept.or vaccine clinic. Aware to provide a copy of the vaccination record if obtained from local pharmacy or Health Dept. Verbalized acceptance and understanding.  Qualifies for Shingles Vaccine? Yes   Zostavax completed No   Shingrix Completed?: No.    Education has been provided regarding the importance of this vaccine. Patient has been advised to call insurance company to determine out of pocket expense if they have not yet received this vaccine. Advised may also receive vaccine at local pharmacy or Health Dept. Verbalized acceptance and understanding.  Screening Tests Health Maintenance  Topic Date Due   FOOT EXAM  07/24/2023   INFLUENZA VACCINE  02/27/2024 (Originally 06/30/2023)   HEMOGLOBIN A1C  01/27/2024   OPHTHALMOLOGY EXAM  03/24/2024   Diabetic kidney  evaluation - eGFR measurement  07/27/2024   Diabetic kidney evaluation - Urine ACR  07/27/2024   Medicare Annual Wellness (AWV)  08/21/2024   Colonoscopy  09/28/2027   DTaP/Tdap/Td (2 - Td or Tdap) 09/23/2029   Hepatitis C Screening  Completed   HPV VACCINES  Aged Out   Pneumonia Vaccine 53+ Years old  Discontinued   COVID-19 Vaccine  Discontinued   Zoster Vaccines- Shingrix  Discontinued    Health Maintenance  Health Maintenance Due  Topic Date Due   FOOT EXAM  07/24/2023    Colorectal cancer screening: Type of screening: Colonoscopy. Completed 09/27/22. Repeat every 5 years  Lung Cancer Screening: (Low Dose CT Chest recommended if Age 9-80 years, 20 pack-year currently smoking OR have quit w/in 15years.) does not qualify.   Lung Cancer Screening Referral: na/  Additional Screening:  Hepatitis C Screening: does qualify; Completed 12/27/19  Vision Screening: Recommended annual ophthalmology exams for early detection of glaucoma and other disorders of the eye. Is the patient up to date with their annual eye exam?  Yes  Who is the provider or what is the name of the office in which the patient attends annual eye exams? [facility] If pt is not established with a provider, would they like to be referred to a provider to establish care? No .   Dental Screening: Recommended annual dental exams for proper oral hygiene  Diabetic Foot Exam: Diabetic Foot Exam: Overdue, Pt has been advised about the importance in completing this exam. Pt is scheduled for diabetic foot exam on at next office visit.  Community Resource Referral / Chronic Care Management: CRR required this visit?  No   CCM required this visit?  No     Plan:     I have personally reviewed and noted the following in the patient's chart:   Medical and social history Use of alcohol, tobacco or illicit drugs  Current medications and supplements including opioid prescriptions. Patient is not currently taking  opioid prescriptions. Functional ability and status Nutritional status Physical activity Advanced directives List of other physicians Hospitalizations, surgeries, and ER visits in previous 12 months Vitals Screenings to include cognitive, depression, and falls Referrals and appointments  In addition, I have reviewed and discussed with patient certain preventive protocols, quality metrics, and best practice recommendations. A written personalized care plan for preventive services as well as general preventive health recommendations were provided to patient.     Kandis Fantasia [place], [place]   [id number]/3329   After Visit Summary: (MyChart) Due to this being a telephonic visit, the after  visit summary with patients personalized plan was offered to patient via MyChart   Nurse Notes: No concerns at this time

## 2023-09-26 DIAGNOSIS — H401132 Primary open-angle glaucoma, bilateral, moderate stage: Secondary | ICD-10-CM | POA: Diagnosis not present

## 2023-09-26 DIAGNOSIS — H26493 Other secondary cataract, bilateral: Secondary | ICD-10-CM | POA: Diagnosis not present

## 2023-09-26 DIAGNOSIS — Z961 Presence of intraocular lens: Secondary | ICD-10-CM | POA: Diagnosis not present

## 2023-10-03 ENCOUNTER — Other Ambulatory Visit: Payer: Self-pay | Admitting: Internal Medicine

## 2023-10-03 DIAGNOSIS — E119 Type 2 diabetes mellitus without complications: Secondary | ICD-10-CM

## 2023-11-25 ENCOUNTER — Encounter: Payer: Self-pay | Admitting: Internal Medicine

## 2023-11-25 ENCOUNTER — Ambulatory Visit: Payer: Medicare Other | Admitting: Internal Medicine

## 2023-11-25 ENCOUNTER — Ambulatory Visit (INDEPENDENT_AMBULATORY_CARE_PROVIDER_SITE_OTHER): Payer: Medicare Other | Admitting: Internal Medicine

## 2023-11-25 VITALS — BP 100/60 | HR 86 | Temp 98.0°F | Ht 71.0 in | Wt 188.0 lb

## 2023-11-25 DIAGNOSIS — K4090 Unilateral inguinal hernia, without obstruction or gangrene, not specified as recurrent: Secondary | ICD-10-CM | POA: Diagnosis not present

## 2023-11-25 NOTE — Progress Notes (Signed)
Subjective:    Patient ID: Alexander Dixon, male    DOB: [birth year], 67 y.o.   MRN: [id number]  HPI Here with concerns about a possible right groin hernia  Noticed a bulge in groin about a month ago Can feel it in the gym or working in the yard Does have some pain May be slightly bigger since he first noticed it  Has reduced some strain on it---like less weight doing squats  Current Outpatient Medications on File Prior to Visit  Medication Sig Dispense Refill   Aromatic Inhalants (VICKS VAPOINHALER) INHA Inhale 1 puff into the lungs daily as needed (congestion).     Blood Glucose Monitoring Suppl (ONETOUCH VERIO FLEX SYSTEM) w/Device KIT See admin instructions.     Cholecalciferol (VITAMIN D3) 50 MCG (2000 UT) TABS Take 2,000 Units by mouth daily.      glucose blood (ONETOUCH VERIO) test strip Use one strip daily to check blood sugar Dx Code E11.9 100 each 12   Ibuprofen-diphenhydrAMINE HCl (ADVIL PM) 200-25 MG CAPS Take 1 capsule by mouth at bedtime as needed (sleep).     Lancet Devices (ONE TOUCH DELICA LANCING DEV) MISC 1 each by Does not apply route daily at 2 PM. 100 each 3   Lancets (ONETOUCH DELICA PLUS LANCET33G) MISC USE TO CHECK BLOOD SUGAR TWICE  DAILY 200 each 3   latanoprost (XALATAN) 0.005 % ophthalmic solution Place 1 drop into both eyes at bedtime.      metFORMIN (GLUCOPHAGE) 1000 MG tablet TAKE 1 TABLET BY MOUTH TWICE  DAILY 200 tablet 2   Minoxidil (ROGAINE MENS EXTRA STRENGTH) 5 % FOAM Apply 1 application. topically 2 (two) times daily.     rosuvastatin (CRESTOR) 5 MG tablet TAKE 1 TABLET BY MOUTH THREE TIMES A WEEK. 36 tablet 3   sildenafil (VIAGRA) 100 MG tablet Take 1 tablet (100 mg total) by mouth daily as needed for erectile dysfunction. 30 tablet 5   No current facility-administered medications on file prior to visit.    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   FVC 71% and  FEV1 73% untreated   Diabetes mellitus without complication [facility])    H/O hepatitis    age 52   History of chicken pox    Prostate cancer (HCC) 2020   Pure hypercholesterolemia 11/21/2019    Past Surgical History:  Procedure Laterality Date   COLONOSCOPY WITH PROPOFOL N/A 04/21/2020   Procedure: COLONOSCOPY WITH PROPOFOL;  Surgeon: Wyline Mood, MD;  Location: [facility] ENDOSCOPY;  Service: Gastroenterology;  Laterality: N/A;  POSITIVE 3/23   COLONOSCOPY WITH PROPOFOL N/A 09/27/2022   Procedure: COLONOSCOPY WITH PROPOFOL;  Surgeon: Wyline Mood, MD;  Location: [facility] ENDOSCOPY;  Service: Gastroenterology;  Laterality: N/A;   CONTINUOUS NERVE MONITORING N/A 01/07/2021   Procedure: FACIAL NERVE MONITORING;  Surgeon: Bud Face, MD;  Location: [facility] SURGERY CNTR;  Service: ENT;  Laterality: N/A;   EAR CYST EXCISION Right 01/07/2021   Procedure: EXCISION EPIDERMAL INCLUSION CYST;  Surgeon: Bud Face, MD;  Location: [facility] SURGERY CNTR;  Service: ENT;  Laterality: Right;   EXCISION MASS HEAD Right 03/11/2023   Procedure: Excision epidermal inclusion cyst with facial nerve monitoring;  Surgeon: Bud Face, MD;  Location: [facility] SURGERY CNTR;  Service: ENT;  Laterality: Right;  diabetic, RIGHT SIDE   HERNIA REPAIR Left    inguinal   HYDROCELE EXCISION Right 02/09/2022   Procedure: HYDROCELECTOMY  ADULT;  Surgeon: Riki Altes, MD;  Location: [facility] ORS;  Service: Urology;  Laterality: Right;   KNEE ARTHROSCOPY Right 1984   PROSTATE SURGERY     due to prostate cancer    Family History  Problem Relation Age of Onset   Leukemia Mother    Arthritis Mother    Diabetes Mother    Alcohol abuse Father    COPD Father    Arthritis Maternal Grandmother    Diabetes Maternal Grandmother    Hearing loss Maternal Grandmother    Arthritis Maternal Grandfather    Heart attack Maternal Grandfather 52   Diabetes Maternal Grandfather    Heart disease Maternal Grandfather     Hyperlipidemia Maternal Grandfather    Hypertension Maternal Grandfather    Prostate cancer Maternal Grandfather    Prostate cancer Maternal Uncle    Diabetes Maternal Uncle    Prostate cancer Cousin    Diabetes Maternal Uncle     Social History   Socioeconomic History   Marital status: Married    Spouse name: Natalia Leatherwood   Number of children: 2   Years of education: associates degree   Highest education level: Not on file  Occupational History   Not on file  Tobacco Use   Smoking status: Former    Current packs/day: 0.00    Average packs/day: 1 pack/day for 30.0 years (30.0 ttl pk-yrs)    Types: Cigarettes    Start date: 12/1970    Quit date: 12/2000    Years since quitting: 22.9    Passive exposure: Never   Smokeless tobacco: Never  Vaping Use   Vaping status: Never Used  Substance and Sexual Activity   Alcohol use: Not Currently    Comment: once in a blue moon   Drug use: Never   Sexual activity: Yes    Birth control/protection: Post-menopausal, Surgical  Other Topics Concern   Not on file  Social History Narrative   11/05/19   From: yes, born [place]   Living: with Natalia Leatherwood   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      Family: 2 daughters - Photographer and Misty Stanley - grandchildren - 4 (most are adults)      Enjoys: gym, working around [employer], stay active - shopping      Exercise: 5 days a week   Diet: better than it used to be, avoids meat - lots of veggies and avoids sweets, mostly water      No living will   Wife then daughter Lauren--should be medical decision makers   Would accept resuscitation   No extended feeding tube--not if cognitively unaware   Social Drivers of Health   Financial Resource Strain: Low Risk  (08/22/2023)   Overall Financial Resource Strain (CARDIA)    Difficulty of Paying Living Expenses: Not hard at all  Food Insecurity: No Food Insecurity (08/22/2023)   Hunger Vital Sign    Worried About Running Out of Food in the Last Year: Never true     Ran Out of Food in the Last Year: Never true  Transportation Needs: No Transportation Needs (08/22/2023)   PRAPARE - Administrator, Civil Service (Medical): No    Lack of Transportation (Non-Medical): No  Physical Activity: Sufficiently Active (08/22/2023)   Exercise Vital Sign    Days of Exercise per Week: 5 days    Minutes of Exercise per Session: 60 min  Stress: No Stress Concern Present (08/22/2023)   [employer] of Occupational Health -  Occupational Stress Questionnaire    Feeling of Stress : Not at all  Social Connections: Moderately Isolated (08/22/2023)   Social Connection and Isolation Panel [NHANES]    Frequency of Communication with Friends and Family: More than three times a week    Frequency of Social Gatherings with Friends and Family: Three times a week    Attends Religious Services: Never    Active Member of Clubs or Organizations: No    Attends Banker Meetings: Never    Marital Status: Married  Catering manager Violence: Not At Risk (08/22/2023)   Humiliation, Afraid, Rape, and Kick questionnaire    Fear of Current or Ex-Partner: No    Emotionally Abused: No    Physically Abused: No    Sexually Abused: No   Review of Systems     Objective:   Physical Exam Constitutional:      Appearance: Normal appearance.  Genitourinary:    Comments: Right inguinal hernia with clear intestine present in bulge Mild increased scrotal size--but seems fluid as opposed to mass Neurological:     Mental Status: He is alert.            Assessment & Plan:

## 2023-11-25 NOTE — Assessment & Plan Note (Signed)
Symptomatic Will set up with surgeon to proceed to repair

## 2023-12-06 ENCOUNTER — Encounter: Payer: Self-pay | Admitting: General Surgery

## 2023-12-06 ENCOUNTER — Telehealth: Payer: Self-pay | Admitting: General Surgery

## 2023-12-06 ENCOUNTER — Ambulatory Visit: Payer: Self-pay | Admitting: General Surgery

## 2023-12-06 ENCOUNTER — Ambulatory Visit (INDEPENDENT_AMBULATORY_CARE_PROVIDER_SITE_OTHER): Payer: Medicare Other | Admitting: General Surgery

## 2023-12-06 VITALS — BP 155/70 | HR 84 | Temp 98.6°F | Ht 72.0 in | Wt 188.0 lb

## 2023-12-06 DIAGNOSIS — K4090 Unilateral inguinal hernia, without obstruction or gangrene, not specified as recurrent: Secondary | ICD-10-CM

## 2023-12-06 NOTE — Patient Instructions (Signed)
 You have chose to have your hernia repaired. This will be done by Dr. Maurine Minister at [facility].  Please see your (blue) Pre-care information that you have been given today. Our surgery scheduler will call you to verify surgery date and to go over information.   You will need to arrange to be out of work for approximately 1-2 weeks and then you may return with a lifting restriction for 4 more weeks. If you have FMLA or Disability paperwork that needs to be filled out, please have your company fax your paperwork to [phone] or you may drop this by either office. This paperwork will be filled out within 3 days after your surgery has been completed.  You may have a bruise in your groin and also swelling and brusing in your testicle area. You may use ice 4-5 times daily for 15-20 minutes each time. Make sure that you place a barrier between you and the ice pack. To decrease the swelling, you may roll up a bath towel and place it vertically in between your thighs with your testicles resting on the towel. You will want to keep this area elevated as much as possible for several days following surgery.    Inguinal Hernia, Adult Muscles help keep everything in the body in its proper place. But if a weak spot in the muscles develops, something can poke through. That is called a hernia. When this happens in the lower part of the belly (abdomen), it is called an inguinal hernia. (It takes its name from a part of the body in this region called the inguinal canal.) A weak spot in the wall of muscles lets some fat or part of the small intestine bulge through. An inguinal hernia can develop at any age. Men get them more often than women. CAUSES  In adults, an inguinal hernia develops over time. It can be triggered by: Suddenly straining the muscles of the lower abdomen. Lifting heavy objects. Straining to have a bowel movement. Difficult bowel movements (constipation) can lead to this. Constant coughing. This  may be caused by smoking or lung disease. Being overweight. Being pregnant. Working at a job that requires long periods of standing or heavy lifting. Having had an inguinal hernia before. One type can be an emergency situation. It is called a strangulated inguinal hernia. It develops if part of the small intestine slips through the weak spot and cannot get back into the abdomen. The blood supply can be cut off. If that happens, part of the intestine may die. This situation requires emergency surgery. SYMPTOMS  Often, a small inguinal hernia has no symptoms. It is found when a healthcare provider does a physical exam. Larger hernias usually have symptoms.  In adults, symptoms may include: A lump in the groin. This is easier to see when the person is standing. It might disappear when lying down. In men, a lump in the scrotum. Pain or burning in the groin. This occurs especially when lifting, straining or coughing. A dull ache or feeling of pressure in the groin. Signs of a strangulated hernia can include: A bulge in the groin that becomes very painful and tender to the touch. A bulge that turns red or purple. Fever, nausea and vomiting. Inability to have a bowel movement or to pass gas. DIAGNOSIS  To decide if you have an inguinal hernia, a healthcare provider will probably do a physical examination. This will include asking questions about any symptoms you have noticed. The healthcare provider might  feel the groin area and ask you to cough. If an inguinal hernia is felt, the healthcare provider may try to slide it back into the abdomen. Usually no other tests are needed. TREATMENT  Treatments can vary. The size of the hernia makes a difference. Options include: Watchful waiting. This is often suggested if the hernia is small and you have had no symptoms. No medical procedure will be done unless symptoms develop. You will need to watch closely for symptoms. If any occur, contact your  healthcare provider right away. Surgery. This is used if the hernia is larger or you have symptoms. Open surgery. This is usually an outpatient procedure (you will not stay overnight in a hospital). An cut (incision) is made through the skin in the groin. The hernia is put back inside the abdomen. The weak area in the muscles is then repaired by herniorrhaphy or hernioplasty. Herniorrhaphy: in this type of surgery, the weak muscles are sewn back together. Hernioplasty: a patch or mesh is used to close the weak area in the abdominal wall. Laparoscopy. In this procedure, a surgeon makes small incisions. A thin tube with a tiny video camera (called a laparoscope) is put into the abdomen. The surgeon repairs the hernia with mesh by looking with the video camera and using two long instruments. HOME CARE INSTRUCTIONS  After surgery to repair an inguinal hernia: You will need to take pain medicine prescribed by your healthcare provider. Follow all directions carefully. You will need to take care of the wound from the incision. Your activity will be restricted for awhile. This will probably include no heavy lifting for several weeks. You also should not do anything too active for a few weeks. When you can return to work will depend on the type of job that you have. During "watchful waiting" periods, you should: Maintain a healthy weight. Eat a diet high in fiber (fruits, vegetables and whole grains). Drink plenty of fluids to avoid constipation. This means drinking enough water and other liquids to keep your urine clear or pale yellow. Do not lift heavy objects. Do not stand for long periods of time. Quit smoking. This should keep you from developing a frequent cough. SEEK MEDICAL CARE IF:  A bulge develops in your groin area. You feel pain, a burning sensation or pressure in the groin. This might be worse if you are lifting or straining. You develop a fever of more than 100.5 F (38.1 C). SEEK  IMMEDIATE MEDICAL CARE IF:  Pain in the groin increases suddenly. A bulge in the groin gets bigger suddenly and does not go down. For men, there is sudden pain in the scrotum. Or, the size of the scrotum increases. A bulge in the groin area becomes red or purple and is painful to touch. You have nausea or vomiting that does not go away. You feel your heart beating much faster than normal. You cannot have a bowel movement or pass gas. You develop a fever of more than 102.0 F (38.9 C).   This information is not intended to replace advice given to you by your health care provider. Make sure you discuss any questions you have with your health care provider.   Document Released: 04/03/2009 Document Revised: 02/07/2012 Document Reviewed: 05/19/2015 Elsevier Interactive Patient Education [employer]

## 2023-12-06 NOTE — Telephone Encounter (Signed)
 Patient has been advised of Pre-Admission date/time, and Surgery date at [facility].  Surgery Date: 12/14/23 Preadmission Testing Date: 12/12/23 (phone 1p- 4p)  Patient has been made aware to call [phone], between 1-3:00pm the day before surgery, to find out what time to arrive for surgery.

## 2023-12-06 NOTE — Progress Notes (Addendum)
 Patient ID: Alexander Dixon, male   DOB: 01-20-1956, 67 y.o.   MRN: [id number] CC: Right Inguinal Hernia History of Present Illness Alexander Dixon is a 68 y.o. male with past medical history as listed below including prostate cancer status post robotic assisted prostatectomy who presents in consultation for right inguinal hernia.  The patient states that he first noticed this about 6 weeks ago.  He notices a bulge when he was lifting heavy equipment.  He says that the bulge is always been reducible.  He does have some pain when it bulges out that radiates down to his groin.  He has had some nausea but denies any evidence of obstruction including vomiting or obstipation.  He denies any overlying skin changes to the hernia.  Of note, he had a hernia repair on his left side years ago and then did have the robotic prostatectomy.  He can walk up a flight of stairs without getting short of breath.  Previous smoker but not current smoker and he has diabetes but last A1c was in the sevens.  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   FVC 71% and FEV1 73% untreated   Diabetes mellitus without complication [facility])    H/O hepatitis    age 40   History of chicken pox    Prostate cancer (HCC) 2020   Pure hypercholesterolemia 11/21/2019     4  Past Surgical History:  Procedure Laterality Date   COLONOSCOPY WITH PROPOFOL  N/A 04/21/2020   Procedure: COLONOSCOPY WITH PROPOFOL ;  Surgeon: Therisa Bi, MD;  Location: [facility] ENDOSCOPY;  Service: Gastroenterology;  Laterality: N/A;  POSITIVE 3/23   COLONOSCOPY WITH PROPOFOL  N/A 09/27/2022   Procedure: COLONOSCOPY WITH PROPOFOL ;  Surgeon: Therisa Bi, MD;  Location: [facility] ENDOSCOPY;  Service: Gastroenterology;  Laterality: N/A;   CONTINUOUS NERVE MONITORING N/A 01/07/2021   Procedure: FACIAL NERVE MONITORING;  Surgeon: Milissa Hamming, MD;  Location: [facility] SURGERY CNTR;  Service:  ENT;  Laterality: N/A;   EAR CYST EXCISION Right 01/07/2021   Procedure: EXCISION EPIDERMAL INCLUSION CYST;  Surgeon: Milissa Hamming, MD;  Location: [facility] SURGERY CNTR;  Service: ENT;  Laterality: Right;   EXCISION MASS HEAD Right 03/11/2023   Procedure: Excision epidermal inclusion cyst with facial nerve monitoring;  Surgeon: Milissa Hamming, MD;  Location: [facility] SURGERY CNTR;  Service: ENT;  Laterality: Right;  diabetic, RIGHT SIDE   HERNIA REPAIR Left    inguinal   HYDROCELE EXCISION Right 02/09/2022   Procedure: HYDROCELECTOMY ADULT;  Surgeon: Twylla Glendia BROCKS, MD;  Location: [facility] ORS;  Service: Urology;  Laterality: Right;   KNEE ARTHROSCOPY Right 1984   PROSTATE SURGERY     due to prostate cancer    No Known Allergies  Current Outpatient Medications  Medication Sig Dispense Refill   Aromatic Inhalants (VICKS VAPOINHALER) INHA Inhale 1 puff into the lungs daily as needed (congestion).     Blood Glucose Monitoring Suppl (ONETOUCH VERIO FLEX SYSTEM) w/Device KIT See admin instructions.     Cholecalciferol (VITAMIN D3) 50 MCG (2000 UT) TABS Take 2,000 Units by mouth daily.      glucose blood (ONETOUCH VERIO) test strip Use one strip daily to check blood sugar Dx Code E11.9 100 each 12   Ibuprofen-diphenhydrAMINE HCl (ADVIL PM) 200-25 MG CAPS Take 1 capsule by mouth at bedtime as needed (sleep).     Lancet Devices (ONE TOUCH DELICA LANCING DEV) MISC 1 each  by Does not apply route daily at 2 PM. 100 each 3   Lancets (ONETOUCH DELICA PLUS LANCET33G) MISC USE TO CHECK BLOOD SUGAR TWICE  DAILY 200 each 3   latanoprost (XALATAN) 0.005 % ophthalmic solution Place 1 drop into both eyes at bedtime.      metFORMIN  (GLUCOPHAGE ) 1000 MG tablet TAKE 1 TABLET BY MOUTH TWICE  DAILY 200 tablet 2   Minoxidil (ROGAINE MENS EXTRA STRENGTH) 5 % FOAM Apply 1 application. topically 2 (two) times daily.     rosuvastatin  (CRESTOR ) 5 MG tablet TAKE 1 TABLET BY MOUTH THREE TIMES A WEEK. 36 tablet 3    sildenafil  (VIAGRA ) 100 MG tablet Take 1 tablet (100 mg total) by mouth daily as needed for erectile dysfunction. 30 tablet 5   No current facility-administered medications for this visit.    Family History Family History  Problem Relation Age of Onset   Leukemia Mother    Arthritis Mother    Diabetes Mother    Alcohol abuse Father    COPD Father    Arthritis Maternal Grandmother    Diabetes Maternal Grandmother    Hearing loss Maternal Grandmother    Arthritis Maternal Grandfather    Heart attack Maternal Grandfather 52   Diabetes Maternal Grandfather    Heart disease Maternal Grandfather    Hyperlipidemia Maternal Grandfather    Hypertension Maternal Grandfather    Prostate cancer Maternal Grandfather    Prostate cancer Maternal Uncle    Diabetes Maternal Uncle    Prostate cancer Cousin    Diabetes Maternal Uncle        Social History Social History   Tobacco Use   Smoking status: Former    Current packs/day: 0.00    Average packs/day: 1 pack/day for 30.0 years (30.0 ttl pk-yrs)    Types: Cigarettes    Start date: 12/1970    Quit date: 12/2000    Years since quitting: 22.9    Passive exposure: Never   Smokeless tobacco: Never  Vaping Use   Vaping status: Never Used  Substance Use Topics   Alcohol use: Not Currently    Comment: once in a blue moon   Drug use: Never  Enjoys weight lifting but has reduced the weight he is using the onset of his hernias     ROS Full ROS of systems performed and is otherwise negative there than what is stated in the HPI  Physical Exam Blood pressure (!) 155/70, pulse 84, temperature 98.6 F (37 C), temperature source Oral, height 6' (1.829 m), weight 188 lb (85.3 kg), SpO2 99%.  No acute distress, alert and oriented x 3, normal work of breathing on room air, clear to auscultation bilaterally, regular rate and rhythm, abdomen is soft, nontender and nondistended.  There are robotic port sites that are not healed well with some  hyperpigmentation.  On groin exam he has an obvious right groin bulge.  In the supine position this is easily reducible.  On the left I do not appreciate any hernias with Valsalva  Data Reviewed Latest labs within normal limits and his last hemoglobin A1c was 7.2.  I have personally reviewed the patient's imaging and medical records.    Assessment    68 year old male with a reducible right inguinal hernia.  He does have a history of prostatectomy.  Plan    I discussed with him that given he is having symptoms from his hernia I recommended repair.  I also discussed with him that he would need to  be no heavy lifting greater than 10 to 15 pounds for 6 weeks.  We discussed the risk, benefits alternatives of the procedure including risk infection, bleeding, injury to the vas deferens, testicular ischemia, chronic pain and recurrence.  Given that he has a history of prostatectomy I think we should do this robotically.  We will plan for robotic assisted right inguinal hernia repair I also discussed with him that if he has a left we could potentially repair it but I do not appreciate 1 on exam.  Hold aspirin  starting Thursday   Jayson MALVA Endow 12/06/2023, 1:30 PM

## 2023-12-12 ENCOUNTER — Encounter
Admission: RE | Admit: 2023-12-12 | Discharge: 2023-12-12 | Disposition: A | Discharge status: Home / Self Care | Payer: Medicare Other | Source: Ambulatory Visit | Attending: General Surgery | Admitting: General Surgery

## 2023-12-12 ENCOUNTER — Other Ambulatory Visit: Payer: Medicare Other

## 2023-12-12 ENCOUNTER — Other Ambulatory Visit: Payer: Self-pay

## 2023-12-12 VITALS — Ht 72.0 in | Wt 188.0 lb

## 2023-12-12 DIAGNOSIS — I6521 Occlusion and stenosis of right carotid artery: Secondary | ICD-10-CM

## 2023-12-12 DIAGNOSIS — I340 Nonrheumatic mitral (valve) insufficiency: Secondary | ICD-10-CM

## 2023-12-12 DIAGNOSIS — E119 Type 2 diabetes mellitus without complications: Secondary | ICD-10-CM

## 2023-12-12 DIAGNOSIS — E7800 Pure hypercholesterolemia, unspecified: Secondary | ICD-10-CM

## 2023-12-12 HISTORY — DX: Nonrheumatic mitral (valve) insufficiency: I34.0

## 2023-12-12 HISTORY — DX: Unspecified urinary incontinence: R32

## 2023-12-12 NOTE — Patient Instructions (Addendum)
 Your procedure is scheduled on: Wednesday 12/14/23 To find out your arrival time, please call [phone] between 1PM - 3PM on:  Tuesday 12/13/23  Report to the Registration Desk on the 1st floor of the [facility]. FREE Valet parking is available.  If your arrival time is 6:00 am, do not arrive before that time as the [facility] entrance doors do not open until 6:00 am.  REMEMBER: Instructions that are not followed completely may result in serious medical risk, up to and including death; or upon the discretion of your surgeon and anesthesiologist your surgery may need to be rescheduled.  Do not eat food or drink any liquids after midnight the night before surgery.  No gum chewing or hard candies.  One week prior to surgery: Stop Anti-inflammatories (NSAIDS) such as Advil, Aleve, Ibuprofen, Motrin, Naproxen, Naprosyn and Aspirin  based products such as Excedrin, Goody's Powder, BC Powder. You may however, continue to take Tylenol  if needed for pain up until the day of surgery.  Stop ANY OVER THE COUNTER supplements until after surgery.  Continue taking all prescribed medications  with the exception of the following: Hold Metformin  and Sildenafil  until after surgery  TAKE ONLY THESE MEDICATIONS THE MORNING OF SURGERY WITH A SIP OF WATER:  rosuvastatin  (CRESTOR ) 5 MG tablet   No Alcohol for 24 hours before or after surgery.  No Smoking including e-cigarettes for 24 hours before surgery.  No chewable tobacco products for at least 6 hours before surgery.  No nicotine patches on the day of surgery.  Do not use any recreational drugs for at least a week (preferably 2 weeks) before your surgery.  Please be advised that the combination of cocaine and anesthesia may have negative outcomes, up to and including death. If you test positive for cocaine, your surgery will be cancelled.  On the morning of surgery brush your teeth with toothpaste and water, you may rinse your mouth with  mouthwash if you wish. Do not swallow any toothpaste or mouthwash.  Use CHG Soap or wipes as directed on instruction sheet.  Do not wear lotions, powders, or perfumes.   Do not shave body hair from the neck down 48 hours before surgery.  Wear clean comfortable clothing (specific to your surgery type) to the hospital.  Do not wear jewelry, make-up, hairpins, clips or nail polish.  For welded (permanent) jewelry: bracelets, anklets, waist bands, etc.  Please have this removed prior to surgery.  If it is not removed, there is a chance that hospital personnel will need to cut it off on the day of surgery. Contact lenses, hearing aids and dentures may not be worn into surgery.  Do not bring valuables to the hospital. [facility] is not responsible for any missing/lost belongings or valuables.   Notify your doctor if there is any change in your medical condition (cold, fever, infection).  If you are being discharged the day of surgery, you will not be allowed to drive home. You will need a responsible individual to drive you home and stay with you for 24 hours after surgery.   If you are taking public transportation, you will need to have a responsible individual with you.  If you are being admitted to the hospital overnight, leave your suitcase in the car. After surgery it may be brought to your room.  In case of increased patient census, it may be necessary for you, the patient, to continue your postoperative care in the Same Day Surgery department.  After  surgery, you can help prevent lung complications by doing breathing exercises.  Take deep breaths and cough every 1-2 hours. Your doctor may order a device called an Incentive Spirometer to help you take deep breaths. When coughing or sneezing, hold a pillow firmly against your incision with both hands. This is called "splinting." Doing this helps protect your incision. It also decreases belly discomfort.  Surgery Visitation  Policy:  Patients undergoing a surgery or procedure may have two family members or support persons with them as long as the person is not COVID-19 positive or experiencing its symptoms.   Inpatient Visitation:    Visiting hours are 7 a.m. to 8 p.m. Up to four visitors are allowed at one time in a patient room. The visitors may rotate out with other people during the day. One designated support person (adult) may remain overnight.  Due to an increase in RSV and influenza rates and associated hospitalizations, children ages 67 and under will not be able to visit patients in [facility]. Masks continue to be strongly recommended.  Please call the Pre-admissions Testing Dept. at [phone] if you have any questions about these instructions.     Preparing for Surgery with CHLORHEXIDINE  GLUCONATE (CHG) Soap  Chlorhexidine  Gluconate (CHG) Soap  o An antiseptic cleaner that kills germs and bonds with the skin to continue killing germs even after washing  o Used for showering the night before surgery and morning of surgery  Before surgery, you can play an important role by reducing the number of germs on your skin.  CHG (Chlorhexidine  gluconate) soap is an antiseptic cleanser which kills germs and bonds with the skin to continue killing germs even after washing.  Please do not use if you have an allergy to CHG or antibacterial soaps. If your skin becomes reddened/irritated stop using the CHG.  1. Shower the NIGHT BEFORE SURGERY and the MORNING OF SURGERY with CHG soap.  2. If you choose to wash your hair, wash your hair first as usual with your normal shampoo.  3. After shampooing, rinse your hair and body thoroughly to remove the shampoo.  4. Use CHG as you would any other liquid soap. You can apply CHG directly to the skin and wash gently with a scrungie or a clean washcloth.  5. Apply the CHG soap to your body only from the neck down. Do not use on open wounds or open  sores. Avoid contact with your eyes, ears, mouth, and genitals (private parts). Wash face and genitals (private parts) with your normal soap.  6. Wash thoroughly, paying special attention to the area where your surgery will be performed.  7. Thoroughly rinse your body with warm water.  8. Do not shower/wash with your normal soap after using and rinsing off the CHG soap.  9. Pat yourself dry with a clean towel.  10. Wear clean pajamas to bed the night before surgery.  12. Place clean sheets on your bed the night of your first shower and do not sleep with pets.  13. Shower again with the CHG soap on the day of surgery prior to arriving at the hospital.  14. Do not apply any deodorants/lotions/powders.  15. Please wear clean clothes to the hospital.

## 2023-12-13 ENCOUNTER — Encounter
Admission: RE | Admit: 2023-12-13 | Discharge: 2023-12-13 | Discharge status: Home / Self Care | Payer: Medicare Other | Source: Ambulatory Visit | Attending: General Surgery

## 2023-12-13 ENCOUNTER — Encounter: Payer: Self-pay | Admitting: Urgent Care

## 2023-12-13 ENCOUNTER — Other Ambulatory Visit: Payer: Self-pay | Admitting: *Deleted

## 2023-12-13 DIAGNOSIS — I340 Nonrheumatic mitral (valve) insufficiency: Secondary | ICD-10-CM | POA: Diagnosis not present

## 2023-12-13 DIAGNOSIS — C61 Malignant neoplasm of prostate: Secondary | ICD-10-CM

## 2023-12-13 DIAGNOSIS — E119 Type 2 diabetes mellitus without complications: Secondary | ICD-10-CM | POA: Diagnosis not present

## 2023-12-13 DIAGNOSIS — Z01810 Encounter for preprocedural cardiovascular examination: Secondary | ICD-10-CM | POA: Insufficient documentation

## 2023-12-13 DIAGNOSIS — Z7984 Long term (current) use of oral hypoglycemic drugs: Secondary | ICD-10-CM | POA: Diagnosis not present

## 2023-12-13 DIAGNOSIS — E7800 Pure hypercholesterolemia, unspecified: Secondary | ICD-10-CM | POA: Insufficient documentation

## 2023-12-13 DIAGNOSIS — I6521 Occlusion and stenosis of right carotid artery: Secondary | ICD-10-CM | POA: Insufficient documentation

## 2023-12-13 MED ORDER — CHLORHEXIDINE GLUCONATE 0.12 % MT SOLN
15.0000 mL | Freq: Once | OROMUCOSAL | Status: AC
  Administered 2023-12-14: 15 mL via OROMUCOSAL

## 2023-12-13 MED ORDER — SODIUM CHLORIDE 0.9% FLUSH: 3.0000 mL | Freq: Two times a day (BID) | INTRAVENOUS | Status: DC

## 2023-12-13 MED ORDER — ORAL CARE MOUTH RINSE: 15.0000 mL | Freq: Once | OROMUCOSAL | Status: AC

## 2023-12-13 MED ORDER — CEFAZOLIN SODIUM-DEXTROSE 2-4 GM/100ML-% IV SOLN
2.0000 g | INTRAVENOUS | Status: AC
Start: 2023-12-14 — End: 2023-12-15
  Administered 2023-12-14: 2 g via INTRAVENOUS

## 2023-12-13 MED ORDER — CHLORHEXIDINE GLUCONATE CLOTH 2 % EX PADS
6.0000 | MEDICATED_PAD | Freq: Once | CUTANEOUS | Status: AC
  Administered 2023-12-14: 6 via TOPICAL

## 2023-12-13 MED ORDER — SODIUM CHLORIDE 0.9% FLUSH: 3.0000 mL | INTRAVENOUS | Status: DC | PRN

## 2023-12-14 ENCOUNTER — Other Ambulatory Visit: Payer: Self-pay

## 2023-12-14 ENCOUNTER — Ambulatory Visit
Admission: RE | Admit: 2023-12-14 | Discharge: 2023-12-14 | Disposition: A | Discharge status: Home / Self Care | Payer: Medicare Other | Attending: General Surgery | Admitting: General Surgery

## 2023-12-14 ENCOUNTER — Ambulatory Visit: Payer: Medicare Other | Admitting: Anesthesiology

## 2023-12-14 ENCOUNTER — Ambulatory Visit: Payer: Medicare Other | Admitting: Urgent Care

## 2023-12-14 ENCOUNTER — Encounter
Admission: RE | Disposition: A | Discharge status: Home / Self Care | Payer: Self-pay | Source: Home / Self Care | Attending: General Surgery

## 2023-12-14 ENCOUNTER — Encounter: Payer: Self-pay | Admitting: General Surgery

## 2023-12-14 DIAGNOSIS — Z8546 Personal history of malignant neoplasm of prostate: Secondary | ICD-10-CM | POA: Insufficient documentation

## 2023-12-14 DIAGNOSIS — Z9079 Acquired absence of other genital organ(s): Secondary | ICD-10-CM | POA: Insufficient documentation

## 2023-12-14 DIAGNOSIS — Z87891 Personal history of nicotine dependence: Secondary | ICD-10-CM | POA: Diagnosis not present

## 2023-12-14 DIAGNOSIS — K4090 Unilateral inguinal hernia, without obstruction or gangrene, not specified as recurrent: Secondary | ICD-10-CM | POA: Insufficient documentation

## 2023-12-14 DIAGNOSIS — E119 Type 2 diabetes mellitus without complications: Secondary | ICD-10-CM | POA: Diagnosis not present

## 2023-12-14 DIAGNOSIS — J449 Chronic obstructive pulmonary disease, unspecified: Secondary | ICD-10-CM | POA: Diagnosis not present

## 2023-12-14 DIAGNOSIS — Z9889 Other specified postprocedural states: Secondary | ICD-10-CM | POA: Diagnosis not present

## 2023-12-14 DIAGNOSIS — Z7984 Long term (current) use of oral hypoglycemic drugs: Secondary | ICD-10-CM | POA: Insufficient documentation

## 2023-12-14 HISTORY — PX: XI ROBOTIC ASSISTED INGUINAL HERNIA REPAIR WITH MESH: SHX6706

## 2023-12-14 LAB — GLUCOSE, CAPILLARY
Glucose-Capillary: 153 mg/dL — ABNORMAL HIGH (ref 70–99)
Glucose-Capillary: 198 mg/dL — ABNORMAL HIGH (ref 70–99)

## 2023-12-14 SURGERY — REPAIR, HERNIA, INGUINAL, ROBOT-ASSISTED, LAPAROSCOPIC, USING MESH
Anesthesia: General | Laterality: Right

## 2023-12-14 MED ORDER — FENTANYL CITRATE (PF) 100 MCG/2ML IJ SOLN
INTRAMUSCULAR | Status: DC | PRN
  Administered 2023-12-14: 50 ug via INTRAVENOUS

## 2023-12-14 MED ORDER — CHLORHEXIDINE GLUCONATE 0.12 % MT SOLN
OROMUCOSAL | Status: AC
  Filled 2023-12-14: qty 15

## 2023-12-14 MED ORDER — FENTANYL CITRATE (PF) 100 MCG/2ML IJ SOLN
25.0000 ug | INTRAMUSCULAR | Status: DC | PRN
  Administered 2023-12-14: 25 ug via INTRAVENOUS

## 2023-12-14 MED ORDER — PROPOFOL 1000 MG/100ML IV EMUL
INTRAVENOUS | Status: AC
  Filled 2023-12-14: qty 100

## 2023-12-14 MED ORDER — BUPIVACAINE-EPINEPHRINE (PF) 0.5% -1:200000 IJ SOLN
INTRAMUSCULAR | Status: AC
  Filled 2023-12-14: qty 10

## 2023-12-14 MED ORDER — MIDAZOLAM HCL 2 MG/2ML IJ SOLN
INTRAMUSCULAR | Status: DC | PRN
  Administered 2023-12-14: 2 mg via INTRAVENOUS

## 2023-12-14 MED ORDER — ACETAMINOPHEN 10 MG/ML IV SOLN
INTRAVENOUS | Status: DC | PRN
  Administered 2023-12-14: 1000 mg via INTRAVENOUS

## 2023-12-14 MED ORDER — OXYCODONE HCL 5 MG PO TABS: 5.0000 mg | ORAL_TABLET | Freq: Four times a day (QID) | ORAL | 0 refills | Status: DC | PRN

## 2023-12-14 MED ORDER — ONDANSETRON HCL 4 MG/2ML IJ SOLN
INTRAMUSCULAR | Status: DC | PRN
  Administered 2023-12-14 (×2): 4 mg via INTRAVENOUS

## 2023-12-14 MED ORDER — FENTANYL CITRATE (PF) 100 MCG/2ML IJ SOLN
INTRAMUSCULAR | Status: AC
  Filled 2023-12-14: qty 2

## 2023-12-14 MED ORDER — CEFAZOLIN SODIUM-DEXTROSE 2-4 GM/100ML-% IV SOLN
INTRAVENOUS | Status: AC
  Filled 2023-12-14: qty 100

## 2023-12-14 MED ORDER — PROPOFOL 10 MG/ML IV BOLUS
INTRAVENOUS | Status: DC | PRN
  Administered 2023-12-14: 200 mg via INTRAVENOUS

## 2023-12-14 MED ORDER — ACETAMINOPHEN 10 MG/ML IV SOLN
INTRAVENOUS | Status: AC
  Filled 2023-12-14: qty 100

## 2023-12-14 MED ORDER — OXYCODONE HCL 5 MG PO TABS
5.0000 mg | ORAL_TABLET | Freq: Once | ORAL | Status: AC | PRN
  Administered 2023-12-14: 5 mg via ORAL

## 2023-12-14 MED ORDER — BUPIVACAINE LIPOSOME 1.3 % IJ SUSP
INTRAMUSCULAR | Status: AC
  Filled 2023-12-14: qty 10

## 2023-12-14 MED ORDER — ASPIRIN 81 MG PO TBEC: 81.0000 mg | DELAYED_RELEASE_TABLET | Freq: Every day | ORAL | Status: DC

## 2023-12-14 MED ORDER — GLYCOPYRROLATE 0.2 MG/ML IJ SOLN
INTRAMUSCULAR | Status: DC | PRN
  Administered 2023-12-14: .2 mg via INTRAVENOUS

## 2023-12-14 MED ORDER — BUPIVACAINE-EPINEPHRINE (PF) 0.5% -1:200000 IJ SOLN
INTRAMUSCULAR | Status: DC | PRN
  Administered 2023-12-14: 20 mL

## 2023-12-14 MED ORDER — HYDROMORPHONE HCL 1 MG/ML IJ SOLN
INTRAMUSCULAR | Status: DC | PRN
  Administered 2023-12-14: 1 mg via INTRAVENOUS

## 2023-12-14 MED ORDER — DEXAMETHASONE SODIUM PHOSPHATE 10 MG/ML IJ SOLN
INTRAMUSCULAR | Status: DC | PRN
  Administered 2023-12-14: 10 mg via INTRAVENOUS

## 2023-12-14 MED ORDER — SUGAMMADEX SODIUM 200 MG/2ML IV SOLN
INTRAVENOUS | Status: DC | PRN
  Administered 2023-12-14: 200 mg via INTRAVENOUS

## 2023-12-14 MED ORDER — SODIUM CHLORIDE 0.9 % IV SOLN: INTRAVENOUS | Status: DC | PRN

## 2023-12-14 MED ORDER — PHENYLEPHRINE 80 MCG/ML (10ML) SYRINGE FOR IV PUSH (FOR BLOOD PRESSURE SUPPORT)
PREFILLED_SYRINGE | INTRAVENOUS | Status: DC | PRN
  Administered 2023-12-14 (×2): 160 ug via INTRAVENOUS

## 2023-12-14 MED ORDER — ROCURONIUM BROMIDE 100 MG/10ML IV SOLN
INTRAVENOUS | Status: DC | PRN
  Administered 2023-12-14: 10 mg via INTRAVENOUS
  Administered 2023-12-14: 40 mg via INTRAVENOUS
  Administered 2023-12-14: 20 mg via INTRAVENOUS
  Administered 2023-12-14: 50 mg via INTRAVENOUS

## 2023-12-14 MED ORDER — MIDAZOLAM HCL 2 MG/2ML IJ SOLN
INTRAMUSCULAR | Status: AC
  Filled 2023-12-14: qty 2

## 2023-12-14 MED ORDER — LIDOCAINE HCL (CARDIAC) PF 100 MG/5ML IV SOSY
PREFILLED_SYRINGE | INTRAVENOUS | Status: DC | PRN
  Administered 2023-12-14: 100 mg via INTRAVENOUS

## 2023-12-14 MED ORDER — OXYCODONE HCL 5 MG/5ML PO SOLN: 5.0000 mg | Freq: Once | ORAL | Status: AC | PRN

## 2023-12-14 MED ORDER — OXYCODONE HCL 5 MG PO TABS
ORAL_TABLET | ORAL | Status: AC
  Filled 2023-12-14: qty 1

## 2023-12-14 MED ORDER — PHENYLEPHRINE HCL-NACL 20-0.9 MG/250ML-% IV SOLN
INTRAVENOUS | Status: DC | PRN
  Administered 2023-12-14: 25 ug/min via INTRAVENOUS

## 2023-12-14 MED ORDER — HYDROMORPHONE HCL 1 MG/ML IJ SOLN
INTRAMUSCULAR | Status: AC
  Filled 2023-12-14: qty 1

## 2023-12-14 MED ORDER — SUCCINYLCHOLINE CHLORIDE 200 MG/10ML IV SOSY
PREFILLED_SYRINGE | INTRAVENOUS | Status: DC | PRN
  Administered 2023-12-14: 100 mg via INTRAVENOUS

## 2023-12-14 SURGICAL SUPPLY — 42 items
BAG PRESSURE INF REUSE 1000 (BAG) IMPLANT
COVER TIP SHEARS 8 DVNC (MISCELLANEOUS) ×1 IMPLANT
COVER WAND RF STERILE (DRAPES) ×1 IMPLANT
DERMABOND ADVANCED .7 DNX12 (GAUZE/BANDAGES/DRESSINGS) ×1 IMPLANT
DRAPE ARM DVNC X/XI (DISPOSABLE) ×3 IMPLANT
DRAPE COLUMN DVNC XI (DISPOSABLE) ×1 IMPLANT
DRIVER NDL LRG 8 DVNC XI (INSTRUMENTS) ×1 IMPLANT
DRIVER NDLE LRG 8 DVNC XI (INSTRUMENTS) ×1
ELECT REM PT RETURN 9FT ADLT (ELECTROSURGICAL) ×1
ELECTRODE REM PT RTRN 9FT ADLT (ELECTROSURGICAL) ×1 IMPLANT
FORCEPS BPLR R/ABLATION 8 DVNC (INSTRUMENTS) ×1 IMPLANT
FORCEPS PROGRASP DVNC XI (FORCEP) IMPLANT
GLOVE BIOGEL PI IND STRL 7.5 (GLOVE) ×2 IMPLANT
GLOVE SURG SYN 7.0 (GLOVE) ×2
GLOVE SURG SYN 7.0 PF PI (GLOVE) ×2 IMPLANT
GOWN STRL REUS W/ TWL LRG LVL3 (GOWN DISPOSABLE) ×3 IMPLANT
IRRIGATOR SUCT 8 DISP DVNC XI (IRRIGATION / IRRIGATOR) IMPLANT
IV NS 1000ML BAXH (IV SOLUTION) IMPLANT
KIT PINK PAD W/HEAD ARE REST (MISCELLANEOUS) ×1
KIT PINK PAD W/HEAD ARM REST (MISCELLANEOUS) ×1 IMPLANT
MANIFOLD NEPTUNE II (INSTRUMENTS) ×1 IMPLANT
MESH 3DMAX 4X6 RT LRG (Mesh General) IMPLANT
NDL HYPO 22X1.5 SAFETY MO (MISCELLANEOUS) ×1 IMPLANT
NDL INSUFFLATION 14GA 120MM (NEEDLE) ×1 IMPLANT
NEEDLE HYPO 22X1.5 SAFETY MO (MISCELLANEOUS) ×1
NEEDLE INSUFFLATION 14GA 120MM (NEEDLE) ×1
OBTURATOR OPTICAL STND 8 DVNC (TROCAR) ×1
OBTURATOR OPTICALSTD 8 DVNC (TROCAR) ×1 IMPLANT
PACK LAP CHOLECYSTECTOMY (MISCELLANEOUS) ×1 IMPLANT
SCISSORS MNPLR CVD DVNC XI (INSTRUMENTS) ×1 IMPLANT
SEAL UNIV 5-12 XI (MISCELLANEOUS) ×3 IMPLANT
SET TUBE SMOKE EVAC HIGH FLOW (TUBING) ×1 IMPLANT
SOL ELECTROSURG ANTI STICK (MISCELLANEOUS) ×1
SOLUTION ELECTROSURG ANTI STCK (MISCELLANEOUS) ×1 IMPLANT
SUT MNCRL 4-0 27 PS-2 XMFL (SUTURE) ×1
SUT MNCRL 4-0 27XMFL (SUTURE) ×1
SUT STRATA 2-0 23CM CT-2 (SUTURE) ×1 IMPLANT
SUT VIC AB 2-0 SH 27XBRD (SUTURE) ×1 IMPLANT
SUTURE MNCRL 4-0 27XMF (SUTURE) ×1 IMPLANT
TAPE TRANSPORE STRL 2 31045 (GAUZE/BANDAGES/DRESSINGS) IMPLANT
TRAP FLUID SMOKE EVACUATOR (MISCELLANEOUS) ×1 IMPLANT
WATER STERILE IRR 500ML POUR (IV SOLUTION) ×1 IMPLANT

## 2023-12-14 NOTE — H&P (Signed)
 No changes to below H and P, proceed with RIGHT (possible left) robotic assisted inguinal hernia repair with mesh.   Patient ID: Alexander Dixon, male   DOB: Jan 25, 1956, 68 y.o.   MRN: [id number] CC: Right Inguinal Hernia History of Present Illness Alexander Dixon is a 68 y.o. male with past medical history as listed below including prostate cancer status post robotic assisted prostatectomy who presents in consultation for right inguinal hernia.  The patient states that he first noticed this about 6 weeks ago.  He notices a bulge when he was lifting heavy equipment.  He says that the bulge is always been reducible.  He does have some pain when it bulges out that radiates down to his groin.  He has had some nausea but denies any evidence of obstruction including vomiting or obstipation.  He denies any overlying skin changes to the hernia.  Of note, he had a hernia repair on his left side years ago and then did have the robotic prostatectomy.  He can walk up a flight of stairs without getting short of breath.  Previous smoker but not current smoker and he has diabetes but last A1c was in the sevens.   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    FVC 71% and FEV1 73% untreated   Diabetes mellitus without complication [facility])     H/O hepatitis      age 40   History of chicken pox     Prostate cancer (HCC) 2020   Pure hypercholesterolemia 11/21/2019         4        Past Surgical History:  Procedure Laterality Date   COLONOSCOPY WITH PROPOFOL  N/A 04/21/2020    Procedure: COLONOSCOPY WITH PROPOFOL ;  Surgeon: Luke Salaam, MD;  Location[facility] ENDOSCOPY;  Service: Gastroenterology;  Laterality: N/A;  POSITIVE 3/23   COLONOSCOPY WITH PROPOFOL  N/A 09/27/2022    Procedure: COLONOSCOPY WITH PROPOFOL ;  Surgeon: Luke Salaam, MD;  Location: [facility] ENDOSCOPY;  Service: Gastroenterology;  Laterality: N/A;   CONTINUOUS  NERVE MONITORING N/A 01/07/2021    Procedure: FACIAL NERVE MONITORING;  Surgeon: Rogers Clayman, MD;  Location: [facility] SURGERY CNTR;  Service: ENT;  Laterality: N/A;   EAR CYST EXCISION Right 01/07/2021    Procedure: EXCISION EPIDERMAL INCLUSION CYST;  Surgeon: Rogers Clayman, MD;  Location: [facility] SURGERY CNTR;  Service: ENT;  Laterality: Right;   EXCISION MASS HEAD Right 03/11/2023    Procedure: Excision epidermal inclusion cyst with facial nerve monitoring;  Surgeon: Rogers Clayman, MD;  Location: [facility] SURGERY CNTR;  Service: ENT;  Laterality: Right;  diabetic, RIGHT SIDE   HERNIA REPAIR Left      inguinal   HYDROCELE EXCISION Right 02/09/2022    Procedure: HYDROCELECTOMY ADULT;  Surgeon: Geraline Knapp, MD;  Location: [facility] ORS;  Service: Urology;  Laterality: Right;   KNEE ARTHROSCOPY Right 1984   PROSTATE SURGERY        due to prostate cancer          Allergies  No Known Allergies           Current Outpatient Medications  Medication Sig Dispense Refill   Aromatic Inhalants (VICKS VAPOINHALER) INHA Inhale 1 puff into the lungs daily as needed (congestion).       Blood Glucose Monitoring Suppl (ONETOUCH VERIO FLEX SYSTEM) w/Device KIT See admin instructions.  Cholecalciferol (VITAMIN D3) 50 MCG (2000 UT) TABS Take 2,000 Units by mouth daily.        glucose blood (ONETOUCH VERIO) test strip Use one strip daily to check blood sugar Dx Code E11.9 100 each 12   Ibuprofen-diphenhydrAMINE HCl (ADVIL PM) 200-25 MG CAPS Take 1 capsule by mouth at bedtime as needed (sleep).       Lancet Devices (ONE TOUCH DELICA LANCING DEV) MISC 1 each by Does not apply route daily at 2 PM. 100 each 3   Lancets (ONETOUCH DELICA PLUS LANCET33G) MISC USE TO CHECK BLOOD SUGAR TWICE  DAILY 200 each 3   latanoprost (XALATAN) 0.005 % ophthalmic solution Place 1 drop into both eyes at bedtime.        metFORMIN  (GLUCOPHAGE ) 1000 MG tablet TAKE 1 TABLET BY MOUTH TWICE  DAILY 200 tablet 2    Minoxidil (ROGAINE MENS EXTRA STRENGTH) 5 % FOAM Apply 1 application. topically 2 (two) times daily.       rosuvastatin  (CRESTOR ) 5 MG tablet TAKE 1 TABLET BY MOUTH THREE TIMES A WEEK. 36 tablet 3   sildenafil  (VIAGRA ) 100 MG tablet Take 1 tablet (100 mg total) by mouth daily as needed for erectile dysfunction. 30 tablet 5      No current facility-administered medications for this visit.        Family History      Family History  Problem Relation Age of Onset   Leukemia Mother     Arthritis Mother     Diabetes Mother     Alcohol abuse Father     COPD Father     Arthritis Maternal Grandmother     Diabetes Maternal Grandmother     Hearing loss Maternal Grandmother     Arthritis Maternal Grandfather     Heart attack Maternal Grandfather 52   Diabetes Maternal Grandfather     Heart disease Maternal Grandfather     Hyperlipidemia Maternal Grandfather     Hypertension Maternal Grandfather     Prostate cancer Maternal Grandfather     Prostate cancer Maternal Uncle     Diabetes Maternal Uncle     Prostate cancer Cousin     Diabetes Maternal Uncle              Social History Social History  Social History         Tobacco Use   Smoking status: Former      Current packs/day: 0.00      Average packs/day: 1 pack/day for 30.0 years (30.0 ttl pk-yrs)      Types: Cigarettes      Start date: 12/1970      Quit date: 12/2000      Years since quitting: 22.9      Passive exposure: Never   Smokeless tobacco: Never  Vaping Use   Vaping status: Never Used  Substance Use Topics   Alcohol use: Not Currently      Comment: once in a blue moon   Drug use: Never    Enjoys weight lifting but has reduced the weight he is using the onset of his hernias       ROS Full ROS of systems performed and is otherwise negative there than what is stated in the HPI   Physical Exam Blood pressure (!) 155/70, pulse 84, temperature 98.6 F (37 C), temperature source Oral, height 6' (1.829 m),  weight 188 lb (85.3 kg), SpO2 99%.   No acute distress, alert and oriented x 3, normal work  of breathing on room air, clear to auscultation bilaterally, regular rate and rhythm, abdomen is soft, nontender and nondistended.  There are robotic port sites that are not healed well with some hyperpigmentation.  On groin exam he has an obvious right groin bulge.  In the supine position this is easily reducible.  On the left I do not appreciate any hernias with Valsalva   Data Reviewed Latest labs within normal limits and his last hemoglobin A1c was 7.2.   I have personally reviewed the patient's imaging and medical records.     Assessment Assessment 68 year old male with a reducible right inguinal hernia.  He does have a history of prostatectomy.   Plan Plan I discussed with him that given he is having symptoms from his hernia I recommended repair.  I also discussed with him that he would need to be no heavy lifting greater than 10 to 15 pounds for 6 weeks.  We discussed the risk, benefits alternatives of the procedure including risk infection, bleeding, injury to the vas deferens, testicular ischemia, chronic pain and recurrence.  Given that he has a history of prostatectomy I think we should do this robotically.  We will plan for robotic assisted right inguinal hernia repair I also discussed with him that if he has a left we could potentially repair it but I do not appreciate 1 on exam.   Hold aspirin  starting Thursday    Barrett Lick 12/06/2023, 1:30 PM

## 2023-12-14 NOTE — Anesthesia Procedure Notes (Signed)
 Procedure Name: Intubation Date/Time: 12/14/2023 7:36 AM  Performed by: Niki Barter, CRNAPre-anesthesia Checklist: Patient identified, Emergency Drugs available, Suction available and Patient being monitored Patient Re-evaluated:Patient Re-evaluated prior to induction Oxygen Delivery Method: Circle system utilized Preoxygenation: Pre-oxygenation with 100% oxygen Induction Type: IV induction Ventilation: Mask ventilation without difficulty Laryngoscope Size: McGrath and 3 Grade View: Grade I Tube type: Oral Tube size: 7.0 mm Number of attempts: 1 Airway Equipment and Method: Stylet Placement Confirmation: ETT inserted through vocal cords under direct vision, positive ETCO2 and breath sounds checked- equal and bilateral Secured at: 21 cm Tube secured with: Tape Dental Injury: Teeth and Oropharynx as per pre-operative assessment

## 2023-12-14 NOTE — Brief Op Note (Signed)
 12/14/2023  10:24 AM  PATIENT:  Alexander Dixon  68 y.o. male  PRE-OPERATIVE DIAGNOSIS:  inguinal hernia non recurrent, reducible  POST-OPERATIVE DIAGNOSIS:  inguinal hernia non recurrent, reducible  PROCEDURE:  Procedure(s): XI ROBOTIC ASSISTED INGUINAL HERNIA REPAIR WITH MESH, possible bilateral (Right)  SURGEON:  Surgeons and Role:    * Barrett Lick, MD - Primary  PHYSICIAN ASSISTANT:   ASSISTANTS: none   ANESTHESIA:   general  EBL:  15cc   BLOOD ADMINISTERED:none  DRAINS: none   LOCAL MEDICATIONS USED:  MARCAINE     and BUPIVICAINE   SPECIMEN:  No Specimen  DISPOSITION OF SPECIMEN:  N/A  COUNTS:  YES  TOURNIQUET:  * No tourniquets in log *  DICTATION: .Dragon Dictation  PLAN OF CARE: Discharge to home after PACU  PATIENT DISPOSITION:  PACU - hemodynamically stable.   Delay start of Pharmacological VTE agent (>24hrs) due to surgical blood loss or risk of bleeding: no

## 2023-12-14 NOTE — Anesthesia Preprocedure Evaluation (Signed)
 Anesthesia Evaluation  Patient identified by MRN, date of birth, ID band Patient awake    Reviewed: Allergy & Precautions, NPO status , Patient's Chart, lab work & pertinent test results  Airway Mallampati: III  TM Distance: <3 FB Neck ROM: full    Dental  (+) Chipped   Pulmonary shortness of breath and with exertion, COPD, former smoker   Pulmonary exam normal        Cardiovascular (-) angina (-) Past MI Normal cardiovascular exam     Neuro/Psych negative neurological ROS  negative psych ROS   GI/Hepatic negative GI ROS, Neg liver ROS,neg GERD  ,,  Endo/Other  diabetes, Type 2    Renal/GU      Musculoskeletal   Abdominal   Peds  Hematology negative hematology ROS (+)   Anesthesia Other Findings Past Medical History: No date: Arthritis     Comment:  right knee No date: Bladder incontinence 09/27/2019: COPD, mild (HCC)     Comment:  FVC 71% and FEV1 73% untreated No date: Diabetes mellitus without complication (HCC) No date: H/O hepatitis     Comment:  age 68 No date: History of chicken pox No date: Nonrheumatic mitral (valve) insufficiency 2020: Prostate cancer (HCC) 11/21/2019: Pure hypercholesterolemia  Past Surgical History: 04/21/2020: COLONOSCOPY WITH PROPOFOL ; N/A     Comment:  Procedure: COLONOSCOPY WITH PROPOFOL ;  Surgeon: Luke Salaam, MD;  Location: [facility] ENDOSCOPY;  Service:               Gastroenterology;  Laterality: N/A;  POSITIVE 3/23 09/27/2022: COLONOSCOPY WITH PROPOFOL ; N/A     Comment:  Procedure: COLONOSCOPY WITH PROPOFOL ;  Surgeon: Luke Salaam, MD;  Location: [facility] ENDOSCOPY;  Service:               Gastroenterology;  Laterality: N/A; 01/07/2021: CONTINUOUS NERVE MONITORING; N/A     Comment:  Procedure: FACIAL NERVE MONITORING;  Surgeon: Rogers Clayman, MD;  Location: [facility] SURGERY [facility]TR;  Service:               ENT;  Laterality:  N/A; 01/07/2021: EAR CYST EXCISION; Right     Comment:  Procedure: EXCISION EPIDERMAL INCLUSION CYST;  Surgeon:               Rogers Clayman, MD;  Location: [facility] SURGERY CNTR;                Service: ENT;  Laterality: Right; 03/11/2023: EXCISION MASS HEAD; Right     Comment:  Procedure: Excision epidermal inclusion cyst with facial              nerve monitoring;  Surgeon: Rogers Clayman, MD;                Location: [facility] SURGERY CNTR;  Service: ENT;                Laterality: Right;  diabetic, RIGHT SIDE No date: HERNIA REPAIR; Left     Comment:  inguinal 02/09/2022: HYDROCELE EXCISION; Right     Comment:  Procedure: HYDROCELECTOMY ADULT;  Surgeon: Geraline Knapp, MD;  Location: [facility] ORS;  Service: Urology;  Laterality: Right; 1984: KNEE ARTHROSCOPY; Right No date: PROSTATE SURGERY     Comment:  due to prostate cancer  BMI    Body Mass Index: 25.50 kg/m      Reproductive/Obstetrics negative OB ROS                             Anesthesia Physical Anesthesia Plan  ASA: 3  Anesthesia Plan: General ETT   Post-op Pain Management:    Induction: Intravenous  PONV Risk Score and Plan: Ondansetron , Dexamethasone , Midazolam  and Treatment may vary due to age or medical condition  Airway Management Planned: Oral ETT  Additional Equipment:   Intra-op Plan:   Post-operative Plan: Extubation in OR  Informed Consent: I have reviewed the patients History and Physical, chart, labs and discussed the procedure including the risks, benefits and alternatives for the proposed anesthesia with the patient or authorized representative who has indicated his/her understanding and acceptance.     Dental Advisory Given  Plan Discussed with: Anesthesiologist, CRNA and Surgeon  Anesthesia Plan Comments: (Patient consented for risks of anesthesia including but not limited to:  - adverse reactions to medications - damage to eyes,  teeth, lips or other oral mucosa - nerve damage due to positioning  - sore throat or hoarseness - Damage to heart, brain, nerves, lungs, other parts of body or loss of life  Patient voiced understanding and assent.)       Anesthesia Quick Evaluation

## 2023-12-14 NOTE — Discharge Instructions (Addendum)
 Information for Discharge Teaching: DO NOT REMOVE TEAL EXPAREL  BRACELET FOR 4 Days, 96 hours, 12/18/2023 EXPAREL  (bupivacaine  liposome injectable suspension)   Pain relief is important to your recovery. The goal is to control your pain so you can move easier and return to your normal activities as soon as possible after your procedure. Your physician may use several types of medicines to manage pain, swelling, and more.  Your surgeon or anesthesiologist gave you EXPAREL (bupivacaine ) to help control your pain after surgery.  EXPAREL  is a local anesthetic designed to release slowly over an extended period of time to provide pain relief by numbing the tissue around the surgical site. EXPAREL  is designed to release pain medication over time and can control pain for up to 72 hours. Depending on how you respond to EXPAREL , you may require less pain medication during your recovery. EXPAREL  can help reduce or eliminate the need for opioids during the first few days after surgery when pain relief is needed the most. EXPAREL  is not an opioid and is not addictive. It does not cause sleepiness or sedation.   Important! A teal colored band has been placed on your arm with the date, time and amount of EXPAREL  you have received. Please leave this armband in place for the full 96 hours following administration, and then you may remove the band. If you return to the hospital for any reason within 96 hours following the administration of EXPAREL , the armband provides important information that your health care providers to know, and alerts them that you have received this anesthetic.    Possible side effects of EXPAREL : Temporary loss of sensation or ability to move in the area where medication was injected. Nausea, vomiting, constipation Rarely, numbness and tingling in your mouth or lips, lightheadedness, or anxiety may occur. Call your doctor right away if you think you may be experiencing any of these  sensations, or if you have other questions regarding possible side effects.  Follow all other discharge instructions given to you by your surgeon or nurse. Eat a healthy diet and drink plenty of water or other fluids.

## 2023-12-14 NOTE — Op Note (Signed)
 Procedure Date:  12/14/2023  Pre-operative Diagnosis:  Right Inguinal Hernia  Post-operative Diagnosis: Right Inguinal Hernia-Indirect  Procedure: 1.  Robotic assisted Right Inguinal Hernia Repair 2.  Creation of Right Posterior Rectus-Transversalis Fascia Advancment Flap for Coverage of Pelvic Wound (200 cm)  Surgeon:  Severa Daniels, M.D.   Anesthesia:  General endotracheal  Estimated Blood Loss:  15 ml  Specimens:  None  Complications:  None  Indications for Procedure:  This is a 68 y.o. male who presents with a right inguinal hernia.  The options of surgery versus observation were reviewed with the patient and/or family. The risks of bleeding, abscess or infection, recurrence of symptoms, potential for an open procedure, injury to surrounding structures, and chronic pain were all discussed with the patient and he was willing to proceed.  We have planned this transabdominal procedure with the creation of right peritoneal flap based on the posterior rectus sheath and transversalis fascia in order to fully cover the mesh, creating a natural tisssue barrier for the bowel and peritoneal cavity.  Description of Procedure: The patient was correctly identified in the preoperative area and brought into the operating room.  The patient was placed supine with VTE prophylaxis in place.  Appropriate time-outs were performed.  Anesthesia was induced and the patient was intubated.   Appropriate antibiotics were infused.  The abdomen was prepped and draped in a sterile fashion. An incision was made at [place] and a veress needle was placed into the abdomen using standard drop technique. Pneumoperitoneum was then established. Using an optiview trocar a supra-umbilical robotic port was placed. No injury was noted at veress needle insertion site. Two 8-mm robotic ports were placed in the right and left lateral positions under direct visualization.    The [employer] platform was docked onto the patient,  the camera was inserted and targeted, and the instruments were placed under direct visualization.  Both inguinal regions were inspected for hernias and it was confirmed that the patient had a right inguinal hernia.  Using electocautery, the peritoneal and posterior rectus tissue flap was created.  The peritoneum on the right side was scored from the median umbilical ligament laterally towards the ASIS.  The flap was mobilized using robotic scissors and the bipolar instruments, creating a plane along the posterior rectus sheath and transversalis fascia down to the pubic tubercle medially. Medially the dissection was made difficult by previous proastatectomy. It was then further mobilized laterally across the inguinal canal and femoral vessels and onto the psoas muscle. The inferior epigastric vessels were identified and preserved. This created a posterior rectus and peritoneal flap measuring roughly 17 cm x 12 cm.  The hernia sac and contents were reduced preserving all structures. The patient had a right indirect hernia defect with. A large 3D Maxx polypropelene mesh was then inserted into the abdomen along with a 2-0 v-lock suture and 2-0 vicryl. The mesh was secured in place with a 2-0 vicryl suture at the pubic tubere. The mesh was placed into the pre-peritoneal space and there was good coverage of all hernia spaces. Then, the peritoneal flap was advanced over the mesh and carried over to close the defect. A running 2-0 V lock suture was used to approximate the edge of the flap onto the peritoneum.   All needles were removed under direct visualization.  The ports were removed.   Local anesthetic was infused in all incisions  and the incisions were closed with 4-0 Monocryl.  The wounds were cleaned and sealed  with DermaBond. The patient was emerged from anesthesia and extubated and brought to the recovery room for further management.  The patient tolerated the procedure well and all counts were correct at  the end of the case.   Severa Daniels, M.D.

## 2023-12-14 NOTE — Transfer of Care (Signed)
 Immediate Anesthesia Transfer of Care Note  Patient: Alexander Dixon  Procedure(s) Performed: XI ROBOTIC ASSISTED INGUINAL HERNIA REPAIR WITH MESH, possible bilateral (Right)  Patient Location: PACU  Anesthesia Type:General  Level of Consciousness: awake, drowsy, and patient cooperative  Airway & Oxygen Therapy: Patient Spontanous Breathing and Patient connected to face mask oxygen  Post-op Assessment: Report given to RN and Post -op Vital signs reviewed and stable  Post vital signs: Reviewed and stable  Last Vitals:  Vitals Value Taken Time  BP 179/91 12/14/23 1024  Temp 36.8 C 12/14/23 1020  Pulse 82 12/14/23 1029  Resp 15 12/14/23 1029  SpO2 95 % 12/14/23 1029  Vitals shown include unfiled device data.  Last Pain:  Vitals:   12/14/23 1022  TempSrc:   PainSc: 0-No pain         Complications: No notable events documented.

## 2023-12-15 ENCOUNTER — Other Ambulatory Visit: Payer: Self-pay | Admitting: Internal Medicine

## 2023-12-15 ENCOUNTER — Encounter: Payer: Self-pay | Admitting: General Surgery

## 2023-12-16 NOTE — Anesthesia Postprocedure Evaluation (Signed)
Anesthesia Post Note  Patient: Alexander Dixon  Procedure(s) Performed: XI ROBOTIC ASSISTED INGUINAL HERNIA REPAIR WITH MESH, possible bilateral (Right)  Patient location during evaluation: PACU Anesthesia Type: General Level of consciousness: awake and alert Pain management: pain level controlled Vital Signs Assessment: post-procedure vital signs reviewed and stable Respiratory status: spontaneous breathing, nonlabored ventilation, respiratory function stable and patient connected to nasal cannula oxygen Cardiovascular status: blood pressure returned to baseline and stable Postop Assessment: no apparent nausea or vomiting Anesthetic complications: no   No notable events documented.   Last Vitals:  Vitals:   12/14/23 1100 12/14/23 1121  BP: (!) 167/74 (!) 178/75  Pulse: 79 82  Resp: 16 20  Temp:  37 C  SpO2: 97% 95%    Last Pain:  Vitals:   12/15/23 0832  TempSrc:   PainSc: 0-No pain                 Lenard Simmer

## 2023-12-19 ENCOUNTER — Encounter: Payer: Self-pay | Admitting: General Surgery

## 2023-12-21 ENCOUNTER — Other Ambulatory Visit: Payer: Medicare Other

## 2023-12-21 DIAGNOSIS — C61 Malignant neoplasm of prostate: Secondary | ICD-10-CM

## 2023-12-22 LAB — PSA: Prostate Specific Ag, Serum: <0.1 ng/mL (ref 0.0–4.0)

## 2023-12-23 ENCOUNTER — Ambulatory Visit: Payer: Medicare Other | Admitting: Urology

## 2023-12-23 ENCOUNTER — Encounter: Payer: Self-pay | Admitting: Urology

## 2023-12-23 VITALS — BP 160/71 | HR 79 | Ht 72.0 in | Wt 185.0 lb

## 2023-12-23 DIAGNOSIS — N9989 Other postprocedural complications and disorders of genitourinary system: Secondary | ICD-10-CM | POA: Diagnosis not present

## 2023-12-23 DIAGNOSIS — Z8546 Personal history of malignant neoplasm of prostate: Secondary | ICD-10-CM | POA: Diagnosis not present

## 2023-12-23 DIAGNOSIS — N393 Stress incontinence (female) (male): Secondary | ICD-10-CM

## 2023-12-23 DIAGNOSIS — Z9079 Acquired absence of other genital organ(s): Secondary | ICD-10-CM

## 2023-12-23 NOTE — Progress Notes (Signed)
I, Maysun Anabel Bene, acting as a scribe for Riki Altes, MD., have documented all relevant documentation on the behalf of Riki Altes, MD, as directed by Riki Altes, MD while in the presence of Riki Altes, MD.  12/23/2023 10:41 AM   Alexander Dixon Apr 19, 1956 [id number]  Referring provider: Karie Schwalbe, MD [address] [place],  [place] [address]  Urologic history: 1. Prostate cancer TRUS/biopsy September 2010 [facility]; PSA 7.39; benign pathology Rebiopsy 05/2015; PSA 9.37; volume 118 cc; Path focal Gleason 3+3; elected active surveillance MRI 11/2015 no evidence of extracapsular disease; confirmatory biopsy 11/2015 1 core Gleason 3+3 adenocarcinoma; volume 126 cc Rebiopsy 06/2019; 2 cores Gleason 3+4 adenocarcinoma Elected RARP by Dr. Harold Barban at [facility]; pT2N0 Post prostatectomy urinary incontinence   2.  Erectile dysfunction Sildenafil 100 mg   3.  Status post left hydrocelectomy Large left hydrocele Hydrocelectomy 02/09/2022  HPI: Alexander Dixon is a 68 y.o. male presents for annual follow-up.   No problem since last year's visit.  He is status post hernia repair on 12/13/22 and is doing well.  Urinary incontinence is stable and he is not interested in pursuing artificial urinary sphincter.  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   FVC 71% and FEV1 73% untreated   Diabetes mellitus without complication (HCC)    H/O hepatitis    age 26   History of chicken pox    Nonrheumatic mitral (valve) insufficiency    Prostate cancer (HCC) 2020   Pure hypercholesterolemia 11/21/2019    Surgical History: Past Surgical History:  Procedure  Laterality Date   COLONOSCOPY WITH PROPOFOL N/A 04/21/2020   Procedure: COLONOSCOPY WITH PROPOFOL;  Surgeon: Wyline Mood, MD;  Location: [facility] ENDOSCOPY;  Service: Gastroenterology;  Laterality: N/A;  POSITIVE 3/23   COLONOSCOPY WITH PROPOFOL N/A 09/27/2022   Procedure: COLONOSCOPY WITH PROPOFOL;  Surgeon: Wyline Mood, MD;  Location: [facility] ENDOSCOPY;  Service: Gastroenterology;  Laterality: N/A;   CONTINUOUS NERVE MONITORING N/A 01/07/2021   Procedure: FACIAL NERVE MONITORING;  Surgeon: Bud Face, MD;  Location: [facility] SURGERY CNTR;  Service: ENT;  Laterality: N/A;   EAR CYST EXCISION Right 01/07/2021   Procedure: EXCISION EPIDERMAL INCLUSION CYST;  Surgeon: Bud Face, MD;  Location: [facility] SUR[facility]ERY [facility];  Service: ENT;  Laterality: Right;   EXCISION MASS HEAD Right 03/11/2023   Procedure: Excision epidermal inclusion cyst with facial nerve monitoring;  Surgeon: Bud Face, MD;  Location: [facility] SURGERY [facility];  Service: ENT;  Laterality: Right;  diabetic, RIGHT SIDE   HERNIA REPAIR Left    inguinal   HYDROCELE EXCISION Right 02/09/2022   Procedure: HYDROCELECTOMY ADULT;  Surgeon: Riki Altes, MD;  Location: [facility] ORS;  Service: Urology;  Laterality: Right;   KNEE ARTHROSCOPY Right 1984   PROSTATE SURGERY     due to prostate cancer   XI ROBOTIC ASSISTED INGUINAL HERNIA REPAIR WITH MESH Right 12/14/2023   Procedure: XI ROBOTIC ASSISTED INGUINAL HERNIA REPAIR WITH MESH, possible bilateral;  Surgeon: Kandis Cocking, MD;  Location: [facility] ORS;  Service: General;  Laterality: Right;  Home Medications:  Allergies as of 12/23/2023   No Known Allergies      Medication List        Accurate as of December 23, 2023 10:41 AM. If you have any questions, ask your nurse or doctor.          Advil PM 200-25 MG Caps Generic drug: Ibuprofen-diphenhydrAMINE HCl Take 1 capsule by mouth at bedtime as needed (sleep).   aspirin EC 81 MG tablet Take 1 tablet (81 mg total) by  mouth daily. Swallow whole.   latanoprost 0.005 % ophthalmic solution Commonly known as: XALATAN Place 1 drop into both eyes at bedtime.   metFORMIN 1000 MG tablet Commonly known as: GLUCOPHAGE TAKE 1 TABLET BY MOUTH TWICE  DAILY   ONE TOUCH DELICA LANCING DEV Misc 1 each by Does not apply route daily at 2 PM.   OneTouch Delica Plus Lancet33G Misc USE TO CHECK BLOOD SUGAR TWICE  DAILY   OneTouch Verio Flex System w/Device Kit See admin instructions.   OneTouch Verio test strip Generic drug: glucose blood USE ONE STRIP DAILY TO CHECK BLOOD SUGAR   oxyCODONE 5 MG immediate release tablet Commonly known as: Oxy IR/ROXICODONE Take 1 tablet (5 mg total) by mouth every 6 (six) hours as needed for severe pain (pain score 7-10).   Rogaine Mens Extra Strength 5 % Foam Generic drug: Minoxidil Apply 1 application  topically daily.   rosuvastatin 5 MG tablet Commonly known as: CRESTOR TAKE 1 TABLET BY MOUTH THREE TIMES A WEEK. What changed: See the new instructions.   sildenafil 100 MG tablet Commonly known as: VIAGRA Take 1 tablet (100 mg total) by mouth daily as needed for erectile dysfunction.   Vicks VapoInhaler Inha Inhale 1 puff into the lungs daily as needed (congestion).   Vitamin D3 50 MCG (2000 UT) Tabs Take 2,000 Units by mouth daily.        Allergies: No Known Allergies  Family History: Family History  Problem Relation Age of Onset   Leukemia Mother    Arthritis Mother    Diabetes Mother    Alcohol abuse Father    COPD Father    Arthritis Maternal Grandmother    Diabetes Maternal Grandmother    Hearing loss Maternal Grandmother    Arthritis Maternal Grandfather    Heart attack Maternal Grandfather 52   Diabetes Maternal Grandfather    Heart disease Maternal Grandfather    Hyperlipidemia Maternal Grandfather    Hypertension Maternal Grandfather    Prostate cancer Maternal Grandfather    Prostate cancer Maternal Uncle    Diabetes Maternal Uncle     Prostate cancer Cousin    Diabetes Maternal Uncle     Social History:  reports that he quit smoking about 22 years ago. His smoking use included cigarettes. He started smoking about 53 years ago. He has a 30 pack-year smoking history. He has never been exposed to tobacco smoke. He has never used smokeless tobacco. He reports that he does not currently use alcohol. He reports that he does not use drugs.   Physical Exam: BP (!) 160/71   Pulse 79   Ht 6' (1.829 m)   Wt 185 lb (83.9 kg)   BMI 25.09 kg/m   Constitutional:  Alert and oriented, No acute distress. HEENT: [place] AT Respiratory: Normal respiratory effort, no increased work of breathing. Psychiatric: Normal mood and affect.   Assessment & Plan:    1. History of prostate cancer Undetectable PSA 1 year follow-up.  2.  Post prostatectomy incontinence  Stable.  Does not desire to pursue artificial urinary sphincter.  I have reviewed the above documentation for accuracy and completeness, and I agree with the above.   Riki Altes, MD  [facility] [address] [place], [place] [address] [phone]

## 2023-12-29 ENCOUNTER — Ambulatory Visit: Payer: Medicare Other | Admitting: General Surgery

## 2023-12-29 ENCOUNTER — Encounter: Payer: Self-pay | Admitting: General Surgery

## 2023-12-29 VITALS — BP 151/69 | HR 71 | Temp 98.0°F | Ht 72.0 in | Wt 185.0 lb

## 2023-12-29 DIAGNOSIS — K4090 Unilateral inguinal hernia, without obstruction or gangrene, not specified as recurrent: Secondary | ICD-10-CM

## 2023-12-29 DIAGNOSIS — Z09 Encounter for follow-up examination after completed treatment for conditions other than malignant neoplasm: Secondary | ICD-10-CM

## 2023-12-29 NOTE — Progress Notes (Signed)
Patient returns status post robotic assisted right inguinal hernia repair for right inguinal hernia.  He reports doing well.  He says initially he had a little bit of pain in the first 2 days after surgery requiring narcotics.  However, after that he was able to tolerate the pain with just Tylenol.  He denies any bruising.  He denies any bulges in his right groin.  He denies any testicular pain.  He is tolerating a diet and having normal bowel function.  On exam his robotic port sites are healing well without any erythema.  There is still surgical glue on the incisions that is starting to peel off.  In his right groin there is some fullness along the cord but there is no evidence of recurrence of his hernia.  He has no pain to palpation.  Discussed with him continue lifting restrictions no greater than 10 to 15 pounds for 4 weeks.  He can follow-up with [place] as needed.

## 2023-12-29 NOTE — Patient Instructions (Signed)

## 2023-12-31 DEATH — deceased

## 2024-01-18 ENCOUNTER — Telehealth: Payer: Self-pay | Admitting: Internal Medicine

## 2024-01-18 NOTE — Telephone Encounter (Signed)
Called to get patient insurance information and spouse Samara Deist informed me that patient passed away on December 30, 2023 suddenly and unexpectedly. Cancelled upcoming appointments with Dr. Alphonsus Sias.

## 2024-01-19 NOTE — Telephone Encounter (Signed)
Yes---I knew about this and did the death certificate. This should have already been noted in his chart

## 2024-01-30 ENCOUNTER — Ambulatory Visit: Payer: Self-pay | Admitting: Internal Medicine

## 2024-12-14 ENCOUNTER — Other Ambulatory Visit: Payer: Medicare Other

## 2024-12-21 ENCOUNTER — Ambulatory Visit: Payer: Self-pay | Admitting: Urology
